# Patient Record
Sex: Female | Born: 1985
Health system: Southern US, Community
[De-identification: ages and names within clinical notes are randomized; demographics above are authoritative.]

## PROBLEM LIST (undated history)

## (undated) DIAGNOSIS — R600 Localized edema: Secondary | ICD-10-CM

## (undated) DIAGNOSIS — K219 Gastro-esophageal reflux disease without esophagitis: Secondary | ICD-10-CM

## (undated) DIAGNOSIS — R079 Chest pain, unspecified: Secondary | ICD-10-CM

## (undated) DIAGNOSIS — H50812 Duane's syndrome, left eye: Secondary | ICD-10-CM

## (undated) DIAGNOSIS — Z862 Personal history of diseases of the blood and blood-forming organs and certain disorders involving the immune mechanism: Secondary | ICD-10-CM

## (undated) DIAGNOSIS — Z87448 Personal history of other diseases of urinary system: Secondary | ICD-10-CM

## (undated) DIAGNOSIS — F319 Bipolar disorder, unspecified: Secondary | ICD-10-CM

## (undated) DIAGNOSIS — F419 Anxiety disorder, unspecified: Secondary | ICD-10-CM

## (undated) DIAGNOSIS — G43909 Migraine, unspecified, not intractable, without status migrainosus: Secondary | ICD-10-CM

## (undated) DIAGNOSIS — B029 Zoster without complications: Secondary | ICD-10-CM

## (undated) DIAGNOSIS — F32A Depression, unspecified: Secondary | ICD-10-CM

## (undated) DIAGNOSIS — D649 Anemia, unspecified: Secondary | ICD-10-CM

## (undated) DIAGNOSIS — L4050 Arthropathic psoriasis, unspecified: Secondary | ICD-10-CM

## (undated) DIAGNOSIS — F988 Other specified behavioral and emotional disorders with onset usually occurring in childhood and adolescence: Secondary | ICD-10-CM

## (undated) DIAGNOSIS — I10 Essential (primary) hypertension: Secondary | ICD-10-CM

## (undated) DIAGNOSIS — R42 Dizziness and giddiness: Secondary | ICD-10-CM

## (undated) DIAGNOSIS — F329 Major depressive disorder, single episode, unspecified: Secondary | ICD-10-CM

## (undated) HISTORY — DX: Essential (primary) hypertension: I10

## (undated) HISTORY — DX: Duane's syndrome, left eye: H50.812

## (undated) HISTORY — DX: Zoster without complications: B02.9

## (undated) HISTORY — DX: Anxiety disorder, unspecified: F41.9

## (undated) HISTORY — DX: Personal history of diseases of the blood and blood-forming organs and certain disorders involving the immune mechanism: Z86.2

## (undated) HISTORY — DX: Major depressive disorder, single episode, unspecified: F32.9

## (undated) HISTORY — DX: Localized edema: R60.0

## (undated) HISTORY — DX: Chest pain, unspecified: R07.9

## (undated) HISTORY — DX: Migraine, unspecified, not intractable, without status migrainosus: G43.909

## (undated) HISTORY — DX: Bipolar disorder, unspecified: F31.9

## (undated) HISTORY — DX: Depression, unspecified: F32.A

## (undated) HISTORY — DX: Personal history of other diseases of urinary system: Z87.448

## (undated) HISTORY — DX: Other specified behavioral and emotional disorders with onset usually occurring in childhood and adolescence: F98.8

## (undated) HISTORY — DX: Dizziness and giddiness: R42

---
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
## 2003-01-09 ENCOUNTER — Inpatient Hospital Stay (HOSPITAL_COMMUNITY): Admission: AD | Admit: 2003-01-09 | Discharge: 2003-01-09 | Payer: Self-pay | Admitting: Obstetrics and Gynecology

## 2003-02-25 ENCOUNTER — Inpatient Hospital Stay (HOSPITAL_COMMUNITY): Admission: AD | Admit: 2003-02-25 | Discharge: 2003-02-25 | Payer: Self-pay | Admitting: Obstetrics and Gynecology

## 2003-03-24 ENCOUNTER — Encounter: Payer: Self-pay | Admitting: Obstetrics and Gynecology

## 2003-03-24 ENCOUNTER — Inpatient Hospital Stay (HOSPITAL_COMMUNITY): Admission: AD | Admit: 2003-03-24 | Discharge: 2003-03-24 | Payer: Self-pay | Admitting: Obstetrics and Gynecology

## 2003-03-31 ENCOUNTER — Inpatient Hospital Stay (HOSPITAL_COMMUNITY): Admission: AD | Admit: 2003-03-31 | Discharge: 2003-04-03 | Payer: Self-pay | Admitting: Obstetrics and Gynecology

## 2003-05-03 ENCOUNTER — Other Ambulatory Visit: Admission: RE | Admit: 2003-05-03 | Discharge: 2003-05-03 | Payer: Self-pay | Admitting: Obstetrics and Gynecology

## 2003-11-13 ENCOUNTER — Emergency Department (HOSPITAL_COMMUNITY): Admission: EM | Admit: 2003-11-13 | Discharge: 2003-11-13 | Payer: Self-pay | Admitting: Family Medicine

## 2004-11-28 ENCOUNTER — Emergency Department (HOSPITAL_COMMUNITY): Admission: EM | Admit: 2004-11-28 | Discharge: 2004-11-28 | Payer: Self-pay | Admitting: Emergency Medicine

## 2005-02-12 ENCOUNTER — Emergency Department (HOSPITAL_COMMUNITY): Admission: EM | Admit: 2005-02-12 | Discharge: 2005-02-12 | Payer: Self-pay | Admitting: Emergency Medicine

## 2006-02-03 ENCOUNTER — Emergency Department (HOSPITAL_COMMUNITY): Admission: EM | Admit: 2006-02-03 | Discharge: 2006-02-03 | Payer: Self-pay | Admitting: Emergency Medicine

## 2006-03-12 ENCOUNTER — Encounter: Admission: RE | Admit: 2006-03-12 | Discharge: 2006-03-12 | Payer: Self-pay | Admitting: Family Medicine

## 2006-04-29 ENCOUNTER — Emergency Department (HOSPITAL_COMMUNITY): Admission: EM | Admit: 2006-04-29 | Discharge: 2006-04-29 | Payer: Self-pay | Admitting: Emergency Medicine

## 2008-09-15 ENCOUNTER — Emergency Department (HOSPITAL_COMMUNITY): Admission: EM | Admit: 2008-09-15 | Discharge: 2008-09-15 | Payer: Self-pay | Admitting: Emergency Medicine

## 2009-12-10 ENCOUNTER — Emergency Department (HOSPITAL_COMMUNITY): Admission: EM | Admit: 2009-12-10 | Discharge: 2009-12-10 | Payer: Self-pay | Admitting: Emergency Medicine

## 2010-06-23 ENCOUNTER — Encounter: Payer: Self-pay | Admitting: Obstetrics

## 2010-08-08 ENCOUNTER — Other Ambulatory Visit (HOSPITAL_COMMUNITY): Payer: Self-pay | Admitting: Obstetrics

## 2010-08-08 DIAGNOSIS — O35EXX0 Maternal care for other (suspected) fetal abnormality and damage, fetal genitourinary anomalies, not applicable or unspecified: Secondary | ICD-10-CM

## 2010-08-08 DIAGNOSIS — O358XX0 Maternal care for other (suspected) fetal abnormality and damage, not applicable or unspecified: Secondary | ICD-10-CM

## 2010-08-08 DIAGNOSIS — O2690 Pregnancy related conditions, unspecified, unspecified trimester: Secondary | ICD-10-CM

## 2010-08-16 ENCOUNTER — Other Ambulatory Visit (HOSPITAL_COMMUNITY): Payer: BC Managed Care – PPO

## 2010-08-22 ENCOUNTER — Ambulatory Visit (HOSPITAL_COMMUNITY)
Admission: RE | Admit: 2010-08-22 | Discharge: 2010-08-22 | Disposition: A | Discharge status: Home / Self Care | Payer: BC Managed Care – PPO | Source: Ambulatory Visit | Attending: Obstetrics | Admitting: Obstetrics

## 2010-08-22 ENCOUNTER — Other Ambulatory Visit (HOSPITAL_COMMUNITY): Payer: Self-pay | Admitting: Obstetrics

## 2010-08-22 ENCOUNTER — Other Ambulatory Visit (HOSPITAL_COMMUNITY): Payer: BC Managed Care – PPO

## 2010-08-22 DIAGNOSIS — O358XX0 Maternal care for other (suspected) fetal abnormality and damage, not applicable or unspecified: Secondary | ICD-10-CM

## 2010-08-22 DIAGNOSIS — Z3689 Encounter for other specified antenatal screening: Secondary | ICD-10-CM | POA: Insufficient documentation

## 2010-08-22 DIAGNOSIS — Z8751 Personal history of pre-term labor: Secondary | ICD-10-CM

## 2010-08-22 DIAGNOSIS — O2690 Pregnancy related conditions, unspecified, unspecified trimester: Secondary | ICD-10-CM

## 2010-08-23 ENCOUNTER — Other Ambulatory Visit (HOSPITAL_COMMUNITY): Payer: BC Managed Care – PPO

## 2010-09-12 ENCOUNTER — Ambulatory Visit (HOSPITAL_COMMUNITY)
Admission: RE | Admit: 2010-09-12 | Discharge: 2010-09-12 | Disposition: A | Discharge status: Home / Self Care | Payer: BC Managed Care – PPO | Source: Ambulatory Visit | Attending: Obstetrics | Admitting: Obstetrics

## 2010-09-12 DIAGNOSIS — Z8751 Personal history of pre-term labor: Secondary | ICD-10-CM

## 2010-09-12 DIAGNOSIS — O358XX0 Maternal care for other (suspected) fetal abnormality and damage, not applicable or unspecified: Secondary | ICD-10-CM | POA: Insufficient documentation

## 2010-09-12 DIAGNOSIS — Z3689 Encounter for other specified antenatal screening: Secondary | ICD-10-CM | POA: Insufficient documentation

## 2010-09-16 ENCOUNTER — Other Ambulatory Visit (HOSPITAL_COMMUNITY): Payer: Self-pay | Admitting: Obstetrics

## 2010-09-16 DIAGNOSIS — O358XX0 Maternal care for other (suspected) fetal abnormality and damage, not applicable or unspecified: Secondary | ICD-10-CM

## 2010-09-28 ENCOUNTER — Observation Stay (HOSPITAL_COMMUNITY)
Admission: AD | Admit: 2010-09-28 | Discharge: 2010-09-29 | DRG: 379 | Disposition: A | Discharge status: Home / Self Care | Payer: BC Managed Care – PPO | Source: Ambulatory Visit | Attending: Obstetrics | Admitting: Obstetrics

## 2010-09-28 DIAGNOSIS — O4700 False labor before 37 completed weeks of gestation, unspecified trimester: Principal | ICD-10-CM | POA: Diagnosis present

## 2010-09-29 LAB — FETAL FIBRONECTIN: Fetal Fibronectin: NEGATIVE

## 2010-09-29 LAB — URINALYSIS, ROUTINE W REFLEX MICROSCOPIC
Bilirubin Urine: NEGATIVE
Leukocytes, UA: NEGATIVE
Nitrite: NEGATIVE
pH: 6.5 (ref 5.0–8.0)

## 2010-09-29 LAB — CBC
Hemoglobin: 11.8 g/dL — ABNORMAL LOW (ref 12.0–15.0)
MCHC: 32.3 g/dL (ref 30.0–36.0)
MCV: 83.9 fL (ref 78.0–100.0)

## 2010-09-29 LAB — URINE MICROSCOPIC-ADD ON

## 2010-10-03 ENCOUNTER — Ambulatory Visit (HOSPITAL_COMMUNITY): Payer: BC Managed Care – PPO

## 2010-10-14 ENCOUNTER — Ambulatory Visit (HOSPITAL_COMMUNITY)
Admission: RE | Admit: 2010-10-14 | Discharge: 2010-10-14 | Disposition: A | Discharge status: Home / Self Care | Payer: BC Managed Care – PPO | Source: Ambulatory Visit | Attending: Obstetrics | Admitting: Obstetrics

## 2010-10-14 DIAGNOSIS — O358XX0 Maternal care for other (suspected) fetal abnormality and damage, not applicable or unspecified: Secondary | ICD-10-CM | POA: Insufficient documentation

## 2010-10-14 DIAGNOSIS — Z3689 Encounter for other specified antenatal screening: Secondary | ICD-10-CM | POA: Insufficient documentation

## 2010-10-24 ENCOUNTER — Inpatient Hospital Stay (HOSPITAL_COMMUNITY)
Admission: AD | Admit: 2010-10-24 | Discharge: 2010-10-25 | Disposition: A | Discharge status: Home / Self Care | Payer: BC Managed Care – PPO | Source: Ambulatory Visit | Attending: Obstetrics & Gynecology | Admitting: Obstetrics & Gynecology

## 2010-10-24 ENCOUNTER — Inpatient Hospital Stay (HOSPITAL_COMMUNITY): Payer: BC Managed Care – PPO

## 2010-10-24 DIAGNOSIS — O4700 False labor before 37 completed weeks of gestation, unspecified trimester: Secondary | ICD-10-CM | POA: Insufficient documentation

## 2010-11-09 ENCOUNTER — Inpatient Hospital Stay (HOSPITAL_COMMUNITY)
Admission: AD | Admit: 2010-11-09 | Discharge: 2010-11-11 | DRG: 372 | Disposition: A | Discharge status: Home / Self Care | Payer: BC Managed Care – PPO | Source: Ambulatory Visit | Attending: Obstetrics | Admitting: Obstetrics

## 2010-11-09 DIAGNOSIS — O139 Gestational [pregnancy-induced] hypertension without significant proteinuria, unspecified trimester: Principal | ICD-10-CM | POA: Diagnosis present

## 2010-11-09 DIAGNOSIS — O409XX0 Polyhydramnios, unspecified trimester, not applicable or unspecified: Secondary | ICD-10-CM | POA: Diagnosis present

## 2010-11-09 LAB — CBC
HCT: 39.9 % (ref 36.0–46.0)
Hemoglobin: 13.4 g/dL (ref 12.0–15.0)
MCH: 27.3 pg (ref 26.0–34.0)
MCV: 81.3 fL (ref 78.0–100.0)
Platelets: 237 10*3/uL (ref 150–400)
WBC: 14.4 10*3/uL — ABNORMAL HIGH (ref 4.0–10.5)

## 2010-11-10 LAB — COMPREHENSIVE METABOLIC PANEL
AST: 19 U/L (ref 0–37)
CO2: 18 mEq/L — ABNORMAL LOW (ref 19–32)
Calcium: 10 mg/dL (ref 8.4–10.5)
Creatinine, Ser: 0.52 mg/dL (ref 0.4–1.2)
GFR calc Af Amer: >60 mL/min (ref >60)
GFR calc non Af Amer: >60 mL/min (ref >60)
Potassium: 3.9 mEq/L (ref 3.5–5.1)
Sodium: 131 mEq/L — ABNORMAL LOW (ref 135–145)

## 2010-11-10 LAB — ABO/RH: ABO/RH(D): O POS

## 2010-11-10 LAB — CREATININE, SERUM: Creatinine, Ser: 0.62 mg/dL (ref 0.4–1.2)

## 2010-11-10 LAB — URIC ACID: Uric Acid, Serum: 4.3 mg/dL (ref 2.4–7.0)

## 2010-11-10 LAB — RPR: RPR Ser Ql: NONREACTIVE

## 2010-11-11 LAB — CBC
MCH: 26.3 pg (ref 26.0–34.0)
MCHC: 32 g/dL (ref 30.0–36.0)
Platelets: 185 10*3/uL (ref 150–400)
RDW: 15.1 % (ref 11.5–15.5)

## 2011-02-25 ENCOUNTER — Ambulatory Visit (HOSPITAL_COMMUNITY)
Admission: RE | Admit: 2011-02-25 | Discharge: 2011-02-25 | Disposition: A | Discharge status: Home / Self Care | Payer: BC Managed Care – PPO | Source: Ambulatory Visit | Attending: Obstetrics & Gynecology | Admitting: Obstetrics & Gynecology

## 2011-02-25 NOTE — Progress Notes (Signed)
Adult Lactation Consultation Outpatient Visit Note  Patient Name: Erin Flores Date of Birth: [birth year] Gestational Age at Delivery: Unknown Type of Delivery: vaginal del of female infant on November 10, 1010, Birth Weight (9-20),  Theone Murdoch weighs 14-9 yesterday.  Breastfeeding History: Frequency of Breastfeeding: every 3-4 hrs Length of Feeding :  Voids:  Stools:   Supplementing / Method: Pumping:  Type of Pump: free style   Frequency:3 times daily while at work.  Volume:  1-3 ounces each pumping   Comments: Mother concerned over low milk supply. She works 5 day s a week and pumps 3 times. 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  Her volume is approx 3 ounces in am and then begins to decrease throught out the day to 1-2 ounces. Her son is taking approx 5-6 ounces at each feeding from care giver every 3-4 hrs. She is concerned because she has had to use formula to supplement.    Consultation Evaluation:  Pumped while  here and she got 45ml. Plan for her to begin pumping 6-8 times daily after each feeding on week end and durning the week try to pump at least 5-6 times. Fenugreek was an option that was discussed along with Blessed Thistle and Goats rue. Encouraged mother to have lots of skin to skin time with Eli . Power pumping was also suggested. Encouraged mother to cue base feed Eli on weekends .Scheduled to fup in 2 weeks at mother request..    Initial Feeding Assessment: Pre-feed Weight: Post-feed Weight: Amount Transferred: Comments:  Additional Feeding Assessment: Pre-feed Weight: Post-feed Weight: Amount Transferred: Comments:  Additional Feeding Assessment: Pre-feed Weight: Post-feed Weight: Amount Transferred: Comments:  Total Breast milk Transferred this Visit:  Total Supplement Given:   Additional Interventions:   Follow-Up  fup to see lactation consultant on October 16 at 4 pm.    Stevan Born [facility] 02/25/2011, 3:53  PM

## 2011-03-18 ENCOUNTER — Inpatient Hospital Stay (HOSPITAL_COMMUNITY): Admission: RE | Admit: 2011-03-18 | Payer: BC Managed Care – PPO | Source: Ambulatory Visit

## 2011-06-15 ENCOUNTER — Emergency Department (HOSPITAL_COMMUNITY): Payer: BC Managed Care – PPO

## 2011-06-15 ENCOUNTER — Encounter (HOSPITAL_COMMUNITY): Payer: Self-pay

## 2011-06-15 ENCOUNTER — Emergency Department (HOSPITAL_COMMUNITY)
Admission: EM | Admit: 2011-06-15 | Discharge: 2011-06-16 | Disposition: A | Discharge status: Home / Self Care | Payer: BC Managed Care – PPO | Attending: Emergency Medicine | Admitting: Emergency Medicine

## 2011-06-15 DIAGNOSIS — X500XXA Overexertion from strenuous movement or load, initial encounter: Secondary | ICD-10-CM | POA: Insufficient documentation

## 2011-06-15 DIAGNOSIS — S93409A Sprain of unspecified ligament of unspecified ankle, initial encounter: Secondary | ICD-10-CM | POA: Insufficient documentation

## 2011-06-15 DIAGNOSIS — M79609 Pain in unspecified limb: Secondary | ICD-10-CM | POA: Insufficient documentation

## 2011-06-15 DIAGNOSIS — S93401A Sprain of unspecified ligament of right ankle, initial encounter: Secondary | ICD-10-CM

## 2011-06-15 NOTE — ED Notes (Signed)
Rt ankle and foot pain from injury 1 month ago but pt did not seek treatment

## 2011-06-16 NOTE — ED Notes (Signed)
Pt c/o R ankle pain. Pt states originally injured ankle playing football on 12/25. Pt states stepped wrong last night, now hurting worse. Able to move although painful. Pt tearful. Slight swelling noted. Family at bedside. A.Dennis,RN

## 2011-06-16 NOTE — ED Provider Notes (Signed)
Medical screening examination/treatment/procedure(s) were performed by non-physician practitioner and as supervising physician I was immediately available for consultation/collaboration.   Vida Roller, MD 06/16/11 [phone]

## 2011-06-16 NOTE — ED Provider Notes (Signed)
History     CSN: [id number]  Arrival date & time 06/15/11  2057   First MD Initiated Contact with Patient 06/16/11 [phone]      Chief Complaint  Patient presents with  . Foot Pain    (Consider location/radiation/quality/duration/timing/severity/associated sxs/prior treatment) Patient is a 26 y.o. female presenting with ankle pain. The history is provided by the patient.  Ankle Pain  Pertinent negatives include no numbness.  Pt reports a hx of injury to the right ankle on 05/27/2011 where she stepped in a hole while playing football. Pain the next day that gradually improved over the next couple of weeks though never completely resolved, with worst pain noted on plantar and dorsi flexion. While walking down stairs today as she was leaving the gym, "stepped wrong" on last stair and had inversion injury of same ankle. Currently unable to bear weight. Pain throbbing, constant, moderate in intensity. No radiation from ankle and hindfoot. Denies numbness, tingling, or weakness distal to injury site but does reports weakness to the ankle itself.  History reviewed. No pertinent past medical history.  History reviewed. No pertinent past surgical history.  No family history on file.  History  Substance Use Topics  . Smoking status: Never Smoker   . Smokeless tobacco: Not on file  . Alcohol Use: Yes     Review of Systems  Constitutional: Negative for fever and chills.  Musculoskeletal: Positive for joint swelling and gait problem.  Skin: Negative for color change and wound.  Neurological: Negative for weakness and numbness.    Allergies  Review of patient's allergies indicates no known allergies.  Home Medications  No current outpatient prescriptions on file.  BP 119/72  Pulse 84  Temp(Src) 98.3 F (36.8 C) (Oral)  Resp 17  Ht 5\' 3"  (1.6 m)  Wt 179 lb (81.194 kg)  BMI 31.71 kg/m2  SpO2 99%  LMP 05/15/2011  Physical Exam  Nursing note and vitals reviewed. Constitutional:  She is oriented to person, place, and time. She appears well-developed and well-nourished. No distress.  HENT:  Head: Normocephalic and atraumatic.  Right Ear: External ear normal.  Left Ear: External ear normal.  Eyes: Pupils are equal, round, and reactive to light.  Neck: Normal range of motion. Neck supple.  Cardiovascular: Normal rate and regular rhythm.   Pulmonary/Chest: Effort normal. No respiratory distress.  Abdominal: Soft. She exhibits no distension. There is no tenderness.  Musculoskeletal:       Right ankle: She exhibits decreased range of motion, swelling and ecchymosis. She exhibits no deformity, no laceration and normal pulse. tenderness. Lateral malleolus tenderness found. No medial malleolus and no proximal fibula tenderness found.       Feet:       All other joints without tenderness, edema, or decreased ROM.  Neurological: She is alert and oriented to person, place, and time.       Sensation intact to light touch. Good 2-point discrimination.  Skin: Skin is warm and dry.       Slight ecchymosis over lateral malleolus.    ED Course  Procedures (including critical care time)  Labs Reviewed - No data to display Dg Ankle Complete Right  06/16/2011  *RADIOLOGY REPORT*  Clinical Data: Status post fall, with twisting injury to right ankle and foot.  Lateral right foot and ankle pain.  RIGHT ANKLE - COMPLETE 3+ VIEW  Comparison: None.  Findings: There is no evidence of fracture or dislocation.  The ankle mortise is intact; the interosseous space is  within normal limits.  No talar tilt or subluxation is seen.  A small plantar calcaneal spur is noted.  The joint spaces are preserved.  Soft tissue swelling is noted overlying the lateral malleolus.  IMPRESSION: No evidence of fracture or dislocation.  Original Report Authenticated By: Tonia Ghent, M.D.   Dg Foot Complete Right  06/16/2011  *RADIOLOGY REPORT*  Clinical Data: Status post fall, with twisting injury to right ankle  and foot.  Lateral fifth metatarsal pain.  RIGHT FOOT COMPLETE - 3+ VIEW  Comparison: None.  Findings: There is mild apparent cortical irregularity involving the distal aspect of the fifth proximal phalanx.  This may remain within normal limits; suggest clinical correlation for associated symptoms.  There is no additional evidence to suggest fracture.  The joint spaces are grossly preserved.  There is no evidence of talar subluxation; the subtalar joint is unremarkable in appearance.  A small plantar calcaneal spur is incidentally seen.  No significant soft tissue abnormalities are seen.  IMPRESSION: Mild cortical irregularity involving the distal aspect of the fifth proximal phalanx; this may remain within normal limits.  Suggest clinical correlation for associated symptoms.  No additional evidence for fracture.  Original Report Authenticated By: Tonia Ghent, M.D.     Dx 1: Ankle sprain, right   MDM  X-ray reviewed. Irregularity on x-ray does not correlate with examination to suggest acute fx. Discussed ankle sprain tx with pt, who voices understanding. She will be non-weight bearing for now and will f/u with orthopedics if no improvement in pain after 1 week (though I advised that some pain may persist for up to 6 weeks)        Shaaron Adler, PA 06/16/11 0121   Pt declines pain medication.  [address] [place], [place] 06/16/11 [phone]

## 2013-01-04 ENCOUNTER — Emergency Department (HOSPITAL_COMMUNITY)
Admission: EM | Admit: 2013-01-04 | Discharge: 2013-01-04 | Disposition: A | Discharge status: Home / Self Care | Payer: PRIVATE HEALTH INSURANCE | Source: Home / Self Care | Attending: Emergency Medicine | Admitting: Emergency Medicine

## 2013-01-04 ENCOUNTER — Encounter (HOSPITAL_COMMUNITY): Payer: Self-pay | Admitting: Emergency Medicine

## 2013-01-04 DIAGNOSIS — R0789 Other chest pain: Secondary | ICD-10-CM

## 2013-01-04 DIAGNOSIS — J029 Acute pharyngitis, unspecified: Secondary | ICD-10-CM

## 2013-01-04 DIAGNOSIS — R05 Cough: Secondary | ICD-10-CM

## 2013-01-04 DIAGNOSIS — R109 Unspecified abdominal pain: Secondary | ICD-10-CM

## 2013-01-04 DIAGNOSIS — R071 Chest pain on breathing: Secondary | ICD-10-CM

## 2013-01-04 HISTORY — DX: Arthropathic psoriasis, unspecified: L40.50

## 2013-01-04 LAB — POCT URINALYSIS DIP (DEVICE)
Bilirubin Urine: NEGATIVE
Glucose, UA: NEGATIVE mg/dL
Ketones, ur: NEGATIVE mg/dL
Leukocytes, UA: NEGATIVE
Nitrite: NEGATIVE
Protein, ur: NEGATIVE mg/dL
Specific Gravity, Urine: 1.015 (ref 1.005–1.030)
Urobilinogen, UA: 0.2 mg/dL (ref 0.0–1.0)
pH: 6.5 (ref 5.0–8.0)

## 2013-01-04 LAB — POCT RAPID STREP A: Streptococcus, Group A Screen (Direct): NEGATIVE

## 2013-01-04 MED ORDER — GUAIFENESIN-CODEINE 100-10 MG/5ML PO SYRP: 5.0000 mL | ORAL_SOLUTION | Freq: Three times a day (TID) | ORAL | Status: DC | PRN

## 2013-01-04 NOTE — ED Notes (Signed)
Patient report nausea and diarrhea on Friday and Saturday , that resolved.  Saturday patient reports cough and rib cage soreness around torso with cough or deep inspiration.  Yesterday sore throat started.

## 2013-01-04 NOTE — ED Notes (Signed)
Patient instructed to put on gown 

## 2013-01-04 NOTE — ED Provider Notes (Signed)
CSN: [id number]     Arrival date & time 01/04/13  0802 History     First MD Initiated Contact with Patient 01/04/13 [phone]     Chief Complaint  Patient presents with  . URI   (Consider location/radiation/quality/duration/timing/severity/associated sxs/prior Treatment) HPI Comments: Issue presents urgent care the describes she needed to be checked as she is experiencing cough and soreness in her chest when she takes a deep breath or has a coughing episode. He describes that yesterday she started also with a sore throat when she swallows. Reports no fevers . Friday had an episode on which she was feeling nauseous and had diarrhea. That has since then resolved.  Patient denies any abdominal pain at rest, nausea vomiting at this point. Denies any urinary symptoms such as increased frequency pain pressure burning with urination. Denies any vaginal discharge or bleeding. Patient denies any generalized malaise at this point such as headache problems or myalgias. She is somewhat concerned as she takes Humira for her rheumatoid arthritis and she's immunosuppressed and is wondering if she shouldn't be working tomorrow and to prevent exposing other people to a potential infection.      Patient is a 27 y.o. female presenting with URI. The history is provided by the patient.  URI Presenting symptoms: congestion, cough and sore throat   Presenting symptoms: no ear pain, no fatigue, no fever and no rhinorrhea   Severity:  Moderate Onset quality:  Sudden Duration:  1 day Timing:  Constant Chronicity:  New Worsened by:  Nothing tried Associated symptoms: sinus pain   Associated symptoms: no arthralgias, no headaches, no neck pain, no sneezing and no wheezing   Risk factors: no chronic cardiac disease, no chronic kidney disease, no diabetes mellitus, no recent illness, no recent travel and no sick contacts     Past Medical History  Diagnosis Date  . Psoriatic arthritis    History reviewed. No  pertinent past surgical history. History reviewed. No pertinent family history. History  Substance Use Topics  . Smoking status: Never Smoker   . Smokeless tobacco: Not on file  . Alcohol Use: Yes   OB History   Grav Para Term Preterm Abortions TAB SAB Ect Mult Living                 Review of Systems  Constitutional: Negative for fever, diaphoresis, appetite change and fatigue.  HENT: Positive for congestion and sore throat. Negative for ear pain, nosebleeds, facial swelling, rhinorrhea, sneezing, neck pain and neck stiffness.   Respiratory: Positive for cough. Negative for wheezing.   Cardiovascular: Negative for chest pain and leg swelling.  Musculoskeletal: Negative for back pain, joint swelling and arthralgias.  Skin: Negative for color change, pallor, rash and wound.  Neurological: Negative for headaches.    Allergies  Review of patient's allergies indicates no known allergies.  Home Medications   Current Outpatient Rx  Name  Route  Sig  Dispense  Refill  . Adalimumab (HUMIRA PEN [place])   Subcutaneous   Inject into the skin.         Marland Kitchen ibuprofen (ADVIL,MOTRIN) 200 MG tablet   Oral   Take 200 mg by mouth every 6 (six) hours as needed for pain.         Marland Kitchen guaiFENesin-codeine (ROBITUSSIN AC) 100-10 MG/5ML syrup   Oral   Take 5 mLs by mouth 3 (three) times daily as needed for cough.   120 mL   0    BP 118/64  Pulse 94  Temp(Src) 98 F (36.7 C) (Oral)  Resp 18  SpO2 98% Physical Exam  Nursing note and vitals reviewed. Constitutional: Vital signs are normal. She appears well-developed and well-nourished.  Non-toxic appearance. She does not have a sickly appearance. She does not appear ill. No distress.  HENT:  Head: Normocephalic.  Right Ear: Tympanic membrane normal.  Left Ear: Tympanic membrane normal.  Mouth/Throat: Uvula is midline. Posterior oropharyngeal erythema present. No oropharyngeal exudate or posterior oropharyngeal edema.  Eyes: Conjunctivae are  normal. No scleral icterus.  Neck: Neck supple. No JVD present.  Cardiovascular: Normal rate.  Exam reveals no gallop and no friction rub.   No murmur heard. Abdominal: Soft. She exhibits no mass. There is no hepatosplenomegaly. There is tenderness. There is no rigidity, no rebound, no guarding, no CVA tenderness, no tenderness at McBurney's point and negative Murphy's sign.    Neurological: She is alert.  Skin: No rash noted. No erythema.    ED Course   Procedures (including critical care time)  Labs Reviewed  POCT URINALYSIS DIP (DEVICE) - Abnormal; Notable for the following:    Hgb urine dipstick TRACE (*)    All other components within normal limits  URINE CULTURE  CULTURE, GROUP A STREP  POCT RAPID STREP A ([facility] CARE ONLY)   No results found. 1. Cough   2. Chest wall pain   3. Pharyngitis   4. Abdominal pain     MDM  Problem #1 Pharyngitis, negative strep test, patient afebrile no cervical lymphadenopathies. Most likely viral pharyngitis. I recommended patient to treat this symptomatically with Tylenol and ibuprofen as well was prescribed a antitussive syrup. For a reactive cough and muscle skeletal chest wall discomfort and pain.  Problem #2 incidental or/chronic abdominal pain Patient reported that, she is to see her gynecologist in a week or so for this ongoing discomfort. Patient is afebrile with a soft abdomen unlikely to be experiencing an acute abdomen.  Problem #3 Trace hematuria without any urinary symptoms, I have ordered a urine culture. Advised patient that she will be contacted if abnormal test results.  Plan of care: Symptomatic management with antitussive, and cough suppressant. Symptomatic management with Tylenol or ibuprofen for sore throat discomfort. Instructed patient that if worsening symptoms such as increased abdominal pain, any shortness of breath should return for further evaluation. Patient agreed with treatment plan followup care.  A  doctor's note was provided not to work tomorrow.  Jimmie Molly, MD 01/04/13 1425

## 2013-01-05 LAB — URINE CULTURE: Colony Count: 8000

## 2013-01-06 LAB — CULTURE, GROUP A STREP

## 2013-08-03 ENCOUNTER — Other Ambulatory Visit: Payer: Self-pay | Admitting: Gastroenterology

## 2013-08-03 DIAGNOSIS — R1011 Right upper quadrant pain: Secondary | ICD-10-CM

## 2013-08-03 DIAGNOSIS — R112 Nausea with vomiting, unspecified: Secondary | ICD-10-CM

## 2013-08-05 ENCOUNTER — Ambulatory Visit (HOSPITAL_COMMUNITY)
Admission: RE | Admit: 2013-08-05 | Discharge: 2013-08-05 | Disposition: A | Discharge status: Home / Self Care | Payer: PRIVATE HEALTH INSURANCE | Source: Ambulatory Visit | Attending: Gastroenterology | Admitting: Gastroenterology

## 2013-08-05 DIAGNOSIS — R1011 Right upper quadrant pain: Secondary | ICD-10-CM

## 2013-08-05 DIAGNOSIS — R112 Nausea with vomiting, unspecified: Secondary | ICD-10-CM | POA: Insufficient documentation

## 2013-08-05 MED ORDER — SINCALIDE 5 MCG IJ SOLR
0.0200 ug/kg | Freq: Once | INTRAMUSCULAR | Status: AC
  Administered 2013-08-05: 5 ug via INTRAVENOUS

## 2013-08-05 MED ORDER — STERILE WATER FOR INJECTION IJ SOLN
INTRAMUSCULAR | Status: AC
  Filled 2013-08-05: qty 10

## 2013-08-05 MED ORDER — TECHNETIUM TC 99M MEBROFENIN IV KIT
5.0000 | PACK | Freq: Once | INTRAVENOUS | Status: AC | PRN
  Administered 2013-08-05: 5 via INTRAVENOUS

## 2013-08-05 MED ORDER — SINCALIDE 5 MCG IJ SOLR
INTRAMUSCULAR | Status: AC
  Filled 2013-08-05: qty 5

## 2013-08-12 ENCOUNTER — Ambulatory Visit (HOSPITAL_COMMUNITY): Payer: PRIVATE HEALTH INSURANCE

## 2013-08-16 ENCOUNTER — Ambulatory Visit (INDEPENDENT_AMBULATORY_CARE_PROVIDER_SITE_OTHER): Payer: PRIVATE HEALTH INSURANCE | Admitting: Surgery

## 2013-08-16 ENCOUNTER — Encounter (INDEPENDENT_AMBULATORY_CARE_PROVIDER_SITE_OTHER): Payer: Self-pay | Admitting: Surgery

## 2013-08-16 VITALS — BP 108/72 | HR 76 | Temp 98.8°F | Resp 14 | Ht 63.25 in | Wt 177.0 lb

## 2013-08-16 DIAGNOSIS — K828 Other specified diseases of gallbladder: Secondary | ICD-10-CM | POA: Insufficient documentation

## 2013-08-16 DIAGNOSIS — R109 Unspecified abdominal pain: Secondary | ICD-10-CM | POA: Insufficient documentation

## 2013-08-16 NOTE — Patient Instructions (Signed)
  [facility]  LAPAROSCOPIC SURGERY - POST-OP INSTRUCTIONS  Always review your discharge instruction sheet given to you by the facility where your surgery was performed.  A prescription for pain medication may be given to you upon discharge.  Take your pain medication as prescribed.  If narcotic pain medicine is not needed, then you may take acetaminophen (Tylenol) or ibuprofen (Advil) as needed.  Take your usually prescribed medications unless otherwise directed.  If you need a refill on your pain medication, please contact your pharmacy.  They will contact our office to request authorization. Prescriptions will not be filled after 5 P.M. or on weekends.  You should follow a light diet the first few days after arrival home, such as soup and crackers or toast.  Be sure to include plenty of fluids daily.  Most patients will experience some swelling and bruising in the area of the incisions.  Ice packs will help.  Swelling and bruising can take several days to resolve.   It is common to experience some constipation if taking pain medication after surgery.  Increasing fluid intake and taking a stool softener (such as Colace) will usually help or prevent this problem from occurring.  A mild laxative (Milk of Magnesia or Miralax) should be taken according to package instructions if there are no bowel movements after 48 hours.  Unless discharge instructions indicate otherwise, you may remove your bandages 24-48 hours after surgery, and you may shower at that time.  You may have steri-strips (small skin tapes) in place directly over the incision.  These strips should be left on the skin for 7-10 days.  If your surgeon used skin glue on the incision, you may shower in 24 hours.  The glue will flake off over the next 2-3 weeks.  Any sutures or staples will be removed at the office during your follow-up visit.  ACTIVITIES:  You may resume regular (light) daily activities beginning the  next day-such as daily self-care, walking, climbing stairs-gradually increasing activities as tolerated.  You may have sexual intercourse when it is comfortable.  Refrain from any heavy lifting or straining until approved by your doctor.  You may drive when you are no longer taking prescription pain medication, you can comfortably wear a seatbelt, and you can safely maneuver your car and apply brakes.  You should see your doctor in the office for a follow-up appointment approximately 2-3 weeks after your surgery.  Make sure that you call for this appointment within a day or two after you arrive home to insure a convenient appointment time.  WHEN TO CALL YOUR DOCTOR: 1. Fever over 101.0 2. Inability to urinate 3. Continued bleeding from incision 4. Increased pain, redness, or drainage from the incision 5. Increasing abdominal pain  The clinic staff is available to answer your questions during regular business hours.  Please don't hesitate to call and ask to speak to one of the nurses for clinical concerns.  If you have a medical emergency, go to the nearest emergency room or call 911.  A surgeon from [facility] is always on call for the hospital.  Arra Connaughton M. Shaneisha Burkel, MD, FACS [facility] Office: [phone] Toll Free:  [phone] FAX [phone]  Web site: [email or web address] 

## 2013-08-16 NOTE — Progress Notes (Signed)
General Surgery [facility]  Chief Complaint  Patient presents with  . New Evaluation    eval biliary dyskinesia - referral from Dr. 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 Patient has been symptomatic for approximately 2 months. Pain waxes and wanes. Patient was seen by gastroenterology and underwent abdominal ultrasound which was normal. Nuclear medicine hepatobiliary scan was performed and had a normal ejection fraction of 74% but the patient experienced pain and nausea consistent with her symptoms at the time of administration of CCK. Patient is referred for consideration of cholecystectomy.  Patient has no prior history of hepatobiliary disease. She has no history of jaundice or acholic stools. She has had irregularity with diarrhea and constipation. She has had no prior abdominal surgery other than removal of an intrauterine device yesterday.  There is a family history of biliary disease in the patient's sister.  Past Medical History  Diagnosis Date  . Psoriatic arthritis     Current Outpatient Prescriptions  Medication Sig Dispense Refill  . Adalimumab (HUMIRA PEN [place]) Inject into the skin.      Marland Kitchen ibuprofen (ADVIL,MOTRIN) 200 MG tablet Take 200 mg by mouth every 6 (six) hours as needed for pain.       No current facility-administered medications for this visit.    No Known Allergies  Family History  Problem Relation Age of Onset  . Cancer Maternal Aunt     breast    History   Social History  . Marital Status: Married    Spouse Name: N/A    Number of Children: N/A  . Years of Education: N/A   Social History Main Topics  . Smoking status: Former Smoker    Quit date: 06/02/2006  . Smokeless tobacco: None  . Alcohol Use: Yes     Comment: occ  . Drug Use: No  . Sexual Activity: None    Other Topics Concern  . None   Social History Narrative  . None    REVIEW OF SYSTEMS - PERTINENT POSITIVES ONLY: Denies jaundice. Denies acholic stools. Denies fevers or chills. Nausea but no emesis. Right upper quadrant pain, relatively constant with radiation to the back and right chest.  EXAM: Filed Vitals:   08/16/13 1325  BP: 108/72  Pulse: 76  Temp: 98.8 F (37.1 C)  Resp: 14    GENERAL: well-developed, well-nourished, no acute distress HEENT: normocephalic; pupils equal and reactive; sclerae clear; dentition good; mucous membranes moist NECK:  No palpable masses in the thyroid bed; symmetric on extension; no palpable anterior or posterior cervical lymphadenopathy; no supraclavicular masses; no tenderness CHEST: clear to auscultation bilaterally without rales, rhonchi, or wheezes CARDIAC: regular rate and rhythm without significant murmur; peripheral pulses are full ABDOMEN: soft without distension; bowel sounds present; no mass; no hepatosplenomegaly; no hernia; mild tenderness to palpation at right costal margin EXT:  non-tender without edema; no deformity NEURO: no gross focal deficits; no sign of tremor   LABORATORY RESULTS: See [facility] ([facility]Epic) for most recent results  RADIOLOGY RESULTS: See [facility] (CHL-Epic) for most recent results  IMPRESSION: #1 right upper quadrant abdominal pain with radiation to back and associated nausea, suspected biliary dyskinesia  PLAN: The patient and I discussed these findings at length. We reviewed her ultrasound results and her hepatobiliary scan results. We discussed her symptoms. Patient is interested in laparoscopic  cholecystectomy for management of right upper quadrant abdominal pain and nausea. I explained to her that most people have cholelithiasis. It does not appear from her studies that she has cholelithiasis. Patient has approximately a 60% chance of symptomatic improvement with cholecystectomy. Risk  and benefits of the procedure discussed including the possibility of conversion to open surgery. We will plan on intraoperative cholangiography. Patient would like to proceed with surgery next week.  The risks and benefits of the procedure have been discussed at length with the patient.  The patient understands the proposed procedure, potential alternative treatments, and the course of recovery to be expected.  All of the patient's questions have been answered at this time.  The patient wishes to proceed with surgery.  Velora Hecklerodd M. Chiyoko Torrico, MD, FACS General & Endocrine Surgery [facility]  Primary Care Physician: Theda BelfastHUNG,PATRICK D, MD

## 2013-08-17 ENCOUNTER — Encounter (HOSPITAL_COMMUNITY): Payer: Self-pay | Admitting: *Deleted

## 2013-08-17 NOTE — Progress Notes (Signed)
Patient reported at time of preop phone call had IUD removed on 08/15/13.

## 2013-08-19 ENCOUNTER — Observation Stay (HOSPITAL_COMMUNITY)
Admission: RE | Admit: 2013-08-19 | Discharge: 2013-08-20 | Disposition: A | Discharge status: Home / Self Care | Payer: PRIVATE HEALTH INSURANCE | Source: Ambulatory Visit | Attending: Surgery | Admitting: Surgery

## 2013-08-19 ENCOUNTER — Encounter (HOSPITAL_COMMUNITY): Payer: PRIVATE HEALTH INSURANCE | Admitting: Anesthesiology

## 2013-08-19 ENCOUNTER — Encounter (HOSPITAL_COMMUNITY): Payer: Self-pay | Admitting: *Deleted

## 2013-08-19 ENCOUNTER — Ambulatory Visit (HOSPITAL_COMMUNITY): Payer: PRIVATE HEALTH INSURANCE | Admitting: Anesthesiology

## 2013-08-19 ENCOUNTER — Ambulatory Visit (HOSPITAL_COMMUNITY): Payer: PRIVATE HEALTH INSURANCE

## 2013-08-19 ENCOUNTER — Encounter (HOSPITAL_COMMUNITY)
Admission: RE | Disposition: A | Discharge status: Home / Self Care | Payer: Self-pay | Source: Ambulatory Visit | Attending: Surgery

## 2013-08-19 DIAGNOSIS — K828 Other specified diseases of gallbladder: Secondary | ICD-10-CM | POA: Diagnosis present

## 2013-08-19 DIAGNOSIS — L4050 Arthropathic psoriasis, unspecified: Secondary | ICD-10-CM | POA: Insufficient documentation

## 2013-08-19 DIAGNOSIS — K219 Gastro-esophageal reflux disease without esophagitis: Secondary | ICD-10-CM | POA: Insufficient documentation

## 2013-08-19 DIAGNOSIS — R109 Unspecified abdominal pain: Secondary | ICD-10-CM | POA: Diagnosis present

## 2013-08-19 DIAGNOSIS — Z79899 Other long term (current) drug therapy: Secondary | ICD-10-CM | POA: Insufficient documentation

## 2013-08-19 DIAGNOSIS — K811 Chronic cholecystitis: Principal | ICD-10-CM | POA: Insufficient documentation

## 2013-08-19 DIAGNOSIS — K824 Cholesterolosis of gallbladder: Secondary | ICD-10-CM

## 2013-08-19 DIAGNOSIS — Z87891 Personal history of nicotine dependence: Secondary | ICD-10-CM | POA: Insufficient documentation

## 2013-08-19 HISTORY — DX: Anemia, unspecified: D64.9

## 2013-08-19 HISTORY — PX: CHOLECYSTECTOMY: SHX55

## 2013-08-19 HISTORY — DX: Gastro-esophageal reflux disease without esophagitis: K21.9

## 2013-08-19 LAB — CBC
HCT: 42.3 % (ref 36.0–46.0)
Hemoglobin: 14 g/dL (ref 12.0–15.0)
MCH: 26.9 pg (ref 26.0–34.0)
MCHC: 33.1 g/dL (ref 30.0–36.0)
MCV: 81.3 fL (ref 78.0–100.0)
PLATELETS: 233 10*3/uL (ref 150–400)
RBC: 5.2 MIL/uL — ABNORMAL HIGH (ref 3.87–5.11)
RDW: 12.9 % (ref 11.5–15.5)
WBC: 6.6 10*3/uL (ref 4.0–10.5)

## 2013-08-19 LAB — HCG, SERUM, QUALITATIVE: PREG SERUM: NEGATIVE

## 2013-08-19 SURGERY — LAPAROSCOPIC CHOLECYSTECTOMY WITH INTRAOPERATIVE CHOLANGIOGRAM
Anesthesia: General | Site: Abdomen

## 2013-08-19 MED ORDER — FENTANYL CITRATE 0.05 MG/ML IJ SOLN
INTRAMUSCULAR | Status: DC | PRN
  Administered 2013-08-19: 100 ug via INTRAVENOUS
  Administered 2013-08-19 (×3): 50 ug via INTRAVENOUS

## 2013-08-19 MED ORDER — MIDAZOLAM HCL 5 MG/5ML IJ SOLN
INTRAMUSCULAR | Status: DC | PRN
  Administered 2013-08-19: 2 mg via INTRAVENOUS

## 2013-08-19 MED ORDER — NEOSTIGMINE METHYLSULFATE 1 MG/ML IJ SOLN
INTRAMUSCULAR | Status: AC
  Filled 2013-08-19: qty 10

## 2013-08-19 MED ORDER — SCOPOLAMINE 1 MG/3DAYS TD PT72
1.0000 | MEDICATED_PATCH | TRANSDERMAL | Status: DC
  Administered 2013-08-19: 1.5 mg via TRANSDERMAL
  Filled 2013-08-19: qty 1

## 2013-08-19 MED ORDER — ALPRAZOLAM 0.25 MG PO TABS
0.2500 mg | ORAL_TABLET | Freq: Four times a day (QID) | ORAL | Status: DC | PRN
  Administered 2013-08-19: 0.25 mg via ORAL
  Filled 2013-08-19: qty 1

## 2013-08-19 MED ORDER — ONDANSETRON HCL 4 MG/2ML IJ SOLN
INTRAMUSCULAR | Status: AC
  Filled 2013-08-19: qty 2

## 2013-08-19 MED ORDER — SCOPOLAMINE 1 MG/3DAYS TD PT72
MEDICATED_PATCH | TRANSDERMAL | Status: AC
  Filled 2013-08-19: qty 1

## 2013-08-19 MED ORDER — HYDROMORPHONE HCL PF 1 MG/ML IJ SOLN
INTRAMUSCULAR | Status: AC
  Filled 2013-08-19: qty 1

## 2013-08-19 MED ORDER — IOHEXOL 300 MG/ML  SOLN
INTRAMUSCULAR | Status: DC | PRN
  Administered 2013-08-19: 2.5 mL via INTRAVENOUS

## 2013-08-19 MED ORDER — CEFAZOLIN SODIUM-DEXTROSE 2-3 GM-% IV SOLR
INTRAVENOUS | Status: AC
  Filled 2013-08-19: qty 50

## 2013-08-19 MED ORDER — CEFAZOLIN SODIUM-DEXTROSE 2-3 GM-% IV SOLR
2.0000 g | INTRAVENOUS | Status: AC
  Administered 2013-08-19: 2 g via INTRAVENOUS

## 2013-08-19 MED ORDER — ROCURONIUM BROMIDE 100 MG/10ML IV SOLN
INTRAVENOUS | Status: DC | PRN
  Administered 2013-08-19: 45 mg via INTRAVENOUS

## 2013-08-19 MED ORDER — HYDROMORPHONE HCL PF 1 MG/ML IJ SOLN
0.2500 mg | INTRAMUSCULAR | Status: DC | PRN
  Administered 2013-08-19 (×4): 0.5 mg via INTRAVENOUS

## 2013-08-19 MED ORDER — 0.9 % SODIUM CHLORIDE (POUR BTL) OPTIME
TOPICAL | Status: DC | PRN
  Administered 2013-08-19: 1000 mL

## 2013-08-19 MED ORDER — HYDROMORPHONE HCL PF 1 MG/ML IJ SOLN
1.0000 mg | INTRAMUSCULAR | Status: DC | PRN
  Administered 2013-08-19: 1 mg via INTRAVENOUS
  Filled 2013-08-19: qty 1

## 2013-08-19 MED ORDER — ONDANSETRON HCL 4 MG PO TABS: 4.0000 mg | ORAL_TABLET | Freq: Four times a day (QID) | ORAL | Status: DC | PRN

## 2013-08-19 MED ORDER — HYDROCODONE-ACETAMINOPHEN 5-325 MG PO TABS: 1.0000 | ORAL_TABLET | ORAL | Status: DC | PRN

## 2013-08-19 MED ORDER — LIDOCAINE HCL (CARDIAC) 20 MG/ML IV SOLN
INTRAVENOUS | Status: AC
  Filled 2013-08-19: qty 5

## 2013-08-19 MED ORDER — LIDOCAINE HCL (CARDIAC) 20 MG/ML IV SOLN
INTRAVENOUS | Status: DC | PRN
  Administered 2013-08-19: 50 mg via INTRAVENOUS

## 2013-08-19 MED ORDER — PROMETHAZINE HCL 25 MG/ML IJ SOLN
6.2500 mg | INTRAMUSCULAR | Status: DC | PRN
  Administered 2013-08-19: 12.5 mg via INTRAVENOUS

## 2013-08-19 MED ORDER — KCL IN DEXTROSE-NACL 20-5-0.45 MEQ/L-%-% IV SOLN
INTRAVENOUS | Status: DC
  Administered 2013-08-19: 14:00:00 via INTRAVENOUS
  Filled 2013-08-19 (×2): qty 1000

## 2013-08-19 MED ORDER — PROMETHAZINE HCL 25 MG/ML IJ SOLN
INTRAMUSCULAR | Status: AC
  Filled 2013-08-19: qty 1

## 2013-08-19 MED ORDER — DEXAMETHASONE SODIUM PHOSPHATE 10 MG/ML IJ SOLN
INTRAMUSCULAR | Status: DC | PRN
  Administered 2013-08-19: 10 mg via INTRAVENOUS

## 2013-08-19 MED ORDER — ONDANSETRON HCL 4 MG/2ML IJ SOLN: 4.0000 mg | Freq: Four times a day (QID) | INTRAMUSCULAR | Status: DC | PRN

## 2013-08-19 MED ORDER — FENTANYL CITRATE 0.05 MG/ML IJ SOLN
INTRAMUSCULAR | Status: AC
  Filled 2013-08-19: qty 5

## 2013-08-19 MED ORDER — ONDANSETRON HCL 4 MG/2ML IJ SOLN
INTRAMUSCULAR | Status: DC | PRN
  Administered 2013-08-19: 4 mg via INTRAVENOUS

## 2013-08-19 MED ORDER — PROPOFOL 10 MG/ML IV BOLUS
INTRAVENOUS | Status: DC | PRN
  Administered 2013-08-19: 150 mg via INTRAVENOUS

## 2013-08-19 MED ORDER — LACTATED RINGERS IV SOLN
INTRAVENOUS | Status: DC
  Administered 2013-08-19: 1000 mL via INTRAVENOUS

## 2013-08-19 MED ORDER — MIDAZOLAM HCL 2 MG/2ML IJ SOLN
INTRAMUSCULAR | Status: AC
  Filled 2013-08-19: qty 2

## 2013-08-19 MED ORDER — GLYCOPYRROLATE 0.2 MG/ML IJ SOLN
INTRAMUSCULAR | Status: AC
  Filled 2013-08-19: qty 3

## 2013-08-19 MED ORDER — ACETAMINOPHEN 10 MG/ML IV SOLN
1000.0000 mg | Freq: Once | INTRAVENOUS | Status: AC
  Administered 2013-08-19: 1000 mg via INTRAVENOUS
  Filled 2013-08-19: qty 100

## 2013-08-19 MED ORDER — ACETAMINOPHEN 325 MG PO TABS: 650.0000 mg | ORAL_TABLET | ORAL | Status: DC | PRN

## 2013-08-19 MED ORDER — HYDROCODONE-ACETAMINOPHEN 5-325 MG PO TABS
1.0000 | ORAL_TABLET | ORAL | Status: DC | PRN
  Administered 2013-08-19 – 2013-08-20 (×4): 2 via ORAL
  Filled 2013-08-19 (×4): qty 2

## 2013-08-19 MED ORDER — BUPIVACAINE-EPINEPHRINE PF 0.5-1:200000 % IJ SOLN
INTRAMUSCULAR | Status: AC
  Filled 2013-08-19: qty 30

## 2013-08-19 MED ORDER — GLYCOPYRROLATE 0.2 MG/ML IJ SOLN
INTRAMUSCULAR | Status: DC | PRN
  Administered 2013-08-19: 0.6 mg via INTRAVENOUS

## 2013-08-19 MED ORDER — ROCURONIUM BROMIDE 100 MG/10ML IV SOLN
INTRAVENOUS | Status: AC
  Filled 2013-08-19: qty 1

## 2013-08-19 MED ORDER — PROPOFOL 10 MG/ML IV BOLUS
INTRAVENOUS | Status: AC
  Filled 2013-08-19: qty 20

## 2013-08-19 MED ORDER — NEOSTIGMINE METHYLSULFATE 1 MG/ML IJ SOLN
INTRAMUSCULAR | Status: DC | PRN
  Administered 2013-08-19: 4 mg via INTRAVENOUS

## 2013-08-19 MED ORDER — BUPIVACAINE-EPINEPHRINE 0.5% -1:200000 IJ SOLN
INTRAMUSCULAR | Status: DC | PRN
  Administered 2013-08-19: 20 mL

## 2013-08-19 MED ORDER — LACTATED RINGERS IV SOLN
INTRAVENOUS | Status: DC | PRN
  Administered 2013-08-19: 1000 mL

## 2013-08-19 MED ORDER — LACTATED RINGERS IV SOLN
INTRAVENOUS | Status: DC | PRN
  Administered 2013-08-19 (×2): via INTRAVENOUS

## 2013-08-19 SURGICAL SUPPLY — 34 items
APPLIER CLIP ROT 10 11.4 M/L (STAPLE) ×3
BENZOIN TINCTURE PRP APPL 2/3 (GAUZE/BANDAGES/DRESSINGS) ×3 IMPLANT
CABLE HIGH FREQUENCY MONO STRZ (ELECTRODE) ×3 IMPLANT
CANISTER SUCTION 2500CC (MISCELLANEOUS) ×3 IMPLANT
CHLORAPREP W/TINT 26ML (MISCELLANEOUS) ×3 IMPLANT
CLIP APPLIE ROT 10 11.4 M/L (STAPLE) ×1 IMPLANT
CLOSURE WOUND 1/2 X4 (GAUZE/BANDAGES/DRESSINGS) ×1
COVER MAYO STAND STRL (DRAPES) ×3 IMPLANT
DECANTER SPIKE VIAL GLASS SM (MISCELLANEOUS) ×3 IMPLANT
DRAPE C-ARM 42X120 X-RAY (DRAPES) ×3 IMPLANT
DRAPE LAPAROSCOPIC ABDOMINAL (DRAPES) ×3 IMPLANT
DRAPE UTILITY XL STRL (DRAPES) ×3 IMPLANT
ELECT REM PT RETURN 9FT ADLT (ELECTROSURGICAL) ×3
ELECTRODE REM PT RTRN 9FT ADLT (ELECTROSURGICAL) ×1 IMPLANT
GLOVE SURG ORTHO 8.0 STRL STRW (GLOVE) ×3 IMPLANT
GOWN STRL REUS W/TWL XL LVL3 (GOWN DISPOSABLE) ×6 IMPLANT
HEMOSTAT SURGICEL 4X8 (HEMOSTASIS) IMPLANT
KIT BASIN OR (CUSTOM PROCEDURE TRAY) ×3 IMPLANT
NS IRRIG 1000ML POUR BTL (IV SOLUTION) ×3 IMPLANT
POUCH SPECIMEN RETRIEVAL 10MM (ENDOMECHANICALS) ×3 IMPLANT
SCISSORS LAP 5X35 DISP (ENDOMECHANICALS) ×3 IMPLANT
SET CHOLANGIOGRAPH MIX (MISCELLANEOUS) ×3 IMPLANT
SET IRRIG TUBING LAPAROSCOPIC (IRRIGATION / IRRIGATOR) ×3 IMPLANT
SLEEVE XCEL OPT CAN 5 100 (ENDOMECHANICALS) ×3 IMPLANT
SOLUTION ANTI FOG 6CC (MISCELLANEOUS) ×3 IMPLANT
STRIP CLOSURE SKIN 1/2X4 (GAUZE/BANDAGES/DRESSINGS) ×2 IMPLANT
SUT MNCRL AB 4-0 PS2 18 (SUTURE) ×3 IMPLANT
TOWEL OR 17X26 10 PK STRL BLUE (TOWEL DISPOSABLE) ×3 IMPLANT
TOWEL OR NON WOVEN STRL DISP B (DISPOSABLE) ×3 IMPLANT
TRAY LAP CHOLE (CUSTOM PROCEDURE TRAY) ×3 IMPLANT
TROCAR BLADELESS OPT 5 100 (ENDOMECHANICALS) ×3 IMPLANT
TROCAR XCEL BLUNT TIP 100MML (ENDOMECHANICALS) ×3 IMPLANT
TROCAR XCEL NON-BLD 11X100MML (ENDOMECHANICALS) ×3 IMPLANT
TUBING INSUFFLATION 10FT LAP (TUBING) ×3 IMPLANT

## 2013-08-19 NOTE — Preoperative (Signed)
Beta Blockers   Reason not to administer Beta Blockers:Not Applicable 

## 2013-08-19 NOTE — Transfer of Care (Signed)
Immediate Anesthesia Transfer of Care Note  Patient: Erin Flores  Procedure(s) Performed: Procedure(s): LAPAROSCOPIC CHOLECYSTECTOMY WITH INTRAOPERATIVE CHOLANGIOGRAM (N/A)  Patient Location: PACU  Anesthesia Type:General  Level of Consciousness: awake, alert  and oriented  Airway & Oxygen Therapy: Patient Spontanous Breathing and Patient connected to face mask oxygen  Post-op Assessment: Report given to PACU RN and Post -op Vital signs reviewed and stable  Post vital signs: Reviewed and stable  Complications: No apparent anesthesia complications

## 2013-08-19 NOTE — Op Note (Signed)
Procedure Note  Pre-operative Diagnosis:  Abdominal pain, biliary dyskinesia  Post-operative Diagnosis:  same  Surgeon:  Velora Hecklerodd M. Reyana Leisey, MD, FACS  Assistant:  Romie LeveeAlicia Thomas, MD   Procedure:  Laparoscopic cholecystectomy with intra-operative cholangiography  Anesthesia:  General  Estimated Blood Loss:  minimal  Drains: none         Specimen: Gallbladder to pathology  Indications:  Patient is a 28 yo WF with abdominal pain.  USN does not show gallstones.  HIDA scan normal with EF of 74%, but pain with CCK administration.  Now for cholecystectomy.  Procedure Details:  The patient was seen in the pre-op holding area. The risks, benefits, complications, treatment options, and expected outcomes have been discussed with the patient. The patient agreed with the proposed plan and signed the informed consent form.  The patient was taken to Operating Room, identified as Erin Flores and the procedure verified as Laparoscopic Cholecystectomy with Intraoperative Cholangiogram. A "time out" was completed and the above information confirmed.  Following induction of general anesthesia, the patient was placed in the supine position. The abdomen was prepped and draped in the usual aseptic fashion.  An incision was made in the skin below the umbilicus. The midline fascia was incised and the peritoneal cavity entered and the Hasson canula was introduced under direct vision.  The Hasson canula was secured with a 0-Vicryl pursestring suture. Pneumoperitoneum was established with carbon dioxide. Additional trocars were introduced under direct vision along the right costal margin in the midline, mid-clavicular line, and anterior axillary line.   The gallbladder was identified and the fundus grasped and retracted cephalad. Adhesions were taken down bluntly and the electrocautery was utilized as needed, taking care not to injure any adjacent structures. The infundibulum was grasped and retracted laterally,  exposing the peritoneum overlying the triangle of Calot. The peritoneum was incised and structures exposed with blunt dissection. The cystic duct was clearly identified, bluntly dissected circumferentially, and clipped at the neck of the gallbladder.  An incision was made in the cystic duct and the cholangiogram catheter introduced. The catheter was secured using an ligaclip.  Real-time cholangiography was performed using C-arm fluoroscopy.  There was rapid filling of a normal caliber common bile duct.  There was reflux of contrast into the left and right hepatic ductal systems.  There was free flow distally into the duodenum without filling defect or obstruction.  Catheter was removed from the peritoneal cavity.  The cystic duct was then triply ligated with surgical clips and divided. The cystic artery was identified, dissected circumferentially, ligated with ligaclips, and divided.  The gallbladder was dissected away from the liver bed using the electrocautery for hemostasis. The gallbladder was completely removed from the liver and placed into an endocatch bag. The right upper quadrant was irrigated and the gallbladder bed was inspected. Hemostasis was achieved with the electrocautery. Warm saline irrigation was utilized until clear.  Pneumoperitoneum was released after viewing removal of the trocars with good hemostasis noted. The umbilical wound was irrigated and the fascia was then closed with the pursestring suture.  Local anesthetic was infiltrated at all port sites. The skin incisions were closed with 4-0 Monocril subcuticular sutures and steri-strips and dressings were applied.  Instrument, sponge, and needle counts were correct at the conclusion of the case.  The patient was awakened from anesthesia and brought to the recovery room in stable condition.  The patient tolerated the procedure well.   Velora Hecklerodd M. Kaleo Condrey, MD, [facility] Office:  [phone]   

## 2013-08-19 NOTE — Anesthesia Postprocedure Evaluation (Signed)
  Anesthesia Post-op Note  Patient: Erin Flores  Procedure(s) Performed: Procedure(s) (LRB): LAPAROSCOPIC CHOLECYSTECTOMY WITH INTRAOPERATIVE CHOLANGIOGRAM (N/A)  Patient Location: PACU  Anesthesia Type: General  Level of Consciousness: awake and alert   Airway and Oxygen Therapy: Patient Spontanous Breathing  Post-op Pain: mild  Post-op Assessment: Post-op Vital signs reviewed, Patient's Cardiovascular Status Stable, Respiratory Function Stable, Patent Airway and No signs of Nausea or vomiting  Last Vitals:  Filed Vitals:   08/19/13 1245  BP: 129/83  Pulse: 85  Temp: 36.9 C  Resp: 12    Post-op Vital Signs: stable   Complications: No apparent anesthesia complications

## 2013-08-19 NOTE — Discharge Instructions (Signed)
  [facility]  LAPAROSCOPIC SURGERY - POST-OP INSTRUCTIONS  Always review your discharge instruction sheet given to you by the facility where your surgery was performed.  A prescription for pain medication may be given to you upon discharge.  Take your pain medication as prescribed.  If narcotic pain medicine is not needed, then you may take acetaminophen (Tylenol) or ibuprofen (Advil) as needed.  Take your usually prescribed medications unless otherwise directed.  If you need a refill on your pain medication, please contact your pharmacy.  They will contact our office to request authorization. Prescriptions will not be filled after 5 P.M. or on weekends.  You should follow a light diet the first few days after arrival home, such as soup and crackers or toast.  Be sure to include plenty of fluids daily.  Most patients will experience some swelling and bruising in the area of the incisions.  Ice packs will help.  Swelling and bruising can take several days to resolve.   It is common to experience some constipation if taking pain medication after surgery.  Increasing fluid intake and taking a stool softener (such as Colace) will usually help or prevent this problem from occurring.  A mild laxative (Milk of Magnesia or Miralax) should be taken according to package instructions if there are no bowel movements after 48 hours.  Unless discharge instructions indicate otherwise, you may remove your bandages 24-48 hours after surgery, and you may shower at that time.  You may have steri-strips (small skin tapes) in place directly over the incision.  These strips should be left on the skin for 7-10 days.  If your surgeon used skin glue on the incision, you may shower in 24 hours.  The glue will flake off over the next 2-3 weeks.  Any sutures or staples will be removed at the office during your follow-up visit.  ACTIVITIES:  You may resume regular (light) daily activities beginning the  next day-such as daily self-care, walking, climbing stairs-gradually increasing activities as tolerated.  You may have sexual intercourse when it is comfortable.  Refrain from any heavy lifting or straining until approved by your doctor.  You may drive when you are no longer taking prescription pain medication, you can comfortably wear a seatbelt, and you can safely maneuver your car and apply brakes.  You should see your doctor in the office for a follow-up appointment approximately 2-3 weeks after your surgery.  Make sure that you call for this appointment within a day or two after you arrive home to insure a convenient appointment time.  WHEN TO CALL YOUR DOCTOR: 1. Fever over 101.0 2. Inability to urinate 3. Continued bleeding from incision 4. Increased pain, redness, or drainage from the incision 5. Increasing abdominal pain  The clinic staff is available to answer your questions during regular business hours.  Please don't hesitate to call and ask to speak to one of the nurses for clinical concerns.  If you have a medical emergency, go to the nearest emergency room or call 911.  A surgeon from [facility] is always on call for the hospital.  Cayson Kalb M. Wm Sahagun, MD, FACS [facility] Office: [phone] Toll Free:  [phone] [phone]  Web site: [email or web address] 

## 2013-08-19 NOTE — H&P (View-Only) (Signed)
General Surgery [facility]  Chief Complaint  Patient presents with  . New Evaluation    eval biliary dyskinesia - referral from Dr. 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 Patient has been symptomatic for approximately 2 months. Pain waxes and wanes. Patient was seen by gastroenterology and underwent abdominal ultrasound which was normal. Nuclear medicine hepatobiliary scan was performed and had a normal ejection fraction of 74% but the patient experienced pain and nausea consistent with her symptoms at the time of administration of CCK. Patient is referred for consideration of cholecystectomy.  Patient has no prior history of hepatobiliary disease. She has no history of jaundice or acholic stools. She has had irregularity with diarrhea and constipation. She has had no prior abdominal surgery other than removal of an intrauterine device yesterday.  There is a family history of biliary disease in the patient's sister.  Past Medical History  Diagnosis Date  . Psoriatic arthritis     Current Outpatient Prescriptions  Medication Sig Dispense Refill  . Adalimumab (HUMIRA PEN [place]) Inject into the skin.      Marland Kitchen ibuprofen (ADVIL,MOTRIN) 200 MG tablet Take 200 mg by mouth every 6 (six) hours as needed for pain.       No current facility-administered medications for this visit.    No Known Allergies  Family History  Problem Relation Age of Onset  . Cancer Maternal Aunt     breast    History   Social History  . Marital Status: Married    Spouse Name: N/A    Number of Children: N/A  . Years of Education: N/A   Social History Main Topics  . Smoking status: Former Smoker    Quit date: 06/02/2006  . Smokeless tobacco: None  . Alcohol Use: Yes     Comment: occ  . Drug Use: No  . Sexual Activity: None    Other Topics Concern  . None   Social History Narrative  . None    REVIEW OF SYSTEMS - PERTINENT POSITIVES ONLY: Denies jaundice. Denies acholic stools. Denies fevers or chills. Nausea but no emesis. Right upper quadrant pain, relatively constant with radiation to the back and right chest.  EXAM: Filed Vitals:   08/16/13 1325  BP: 108/72  Pulse: 76  Temp: 98.8 F (37.1 C)  Resp: 14    GENERAL: well-developed, well-nourished, no acute distress HEENT: normocephalic; pupils equal and reactive; sclerae clear; dentition good; mucous membranes moist NECK:  No palpable masses in the thyroid bed; symmetric on extension; no palpable anterior or posterior cervical lymphadenopathy; no supraclavicular masses; no tenderness CHEST: clear to auscultation bilaterally without rales, rhonchi, or wheezes CARDIAC: regular rate and rhythm without significant murmur; peripheral pulses are full ABDOMEN: soft without distension; bowel sounds present; no mass; no hepatosplenomegaly; no hernia; mild tenderness to palpation at right costal margin EXT:  non-tender without edema; no deformity NEURO: no gross focal deficits; no sign of tremor   LABORATORY RESULTS: See [facility]) for most recent results  RADIOLOGY RESULTS: See [facility] (CH[facility]-Epic) for most recent results  IMPRESSION: #1 right upper quadrant abdominal pain with radiation to back and associated nausea, suspected biliary dyskinesia  PLAN: The patient and I discussed these findings at length. We reviewed her ultrasound results and her hepatobiliary scan results. We discussed her symptoms. Patient is interested in laparoscopic  cholecystectomy for management of right upper quadrant abdominal pain and nausea. I explained to her that most people have cholelithiasis. It does not appear from her studies that she has cholelithiasis. Patient has approximately a 60% chance of symptomatic improvement with cholecystectomy. Risk  and benefits of the procedure discussed including the possibility of conversion to open surgery. We will plan on intraoperative cholangiography. Patient would like to proceed with surgery next week.  The risks and benefits of the procedure have been discussed at length with the patient.  The patient understands the proposed procedure, potential alternative treatments, and the course of recovery to be expected.  All of the patient's questions have been answered at this time.  The patient wishes to proceed with surgery.  Velora Hecklerodd M. Irish Breisch, MD, FACS General & Endocrine Surgery [facility]  Primary Care Physician: Theda BelfastHUNG,PATRICK D, MD

## 2013-08-19 NOTE — Interval H&P Note (Signed)
History and Physical Interval Note:  08/19/2013 9:31 AM  Erin Flores  has presented today for surgery, with the diagnosis of abdominal pain biliary dyskinesia.   The various methods of treatment have been discussed with the patient and family. After consideration of risks, benefits and other options for treatment, the patient has consented to    Procedure(s): LAPAROSCOPIC CHOLECYSTECTOMY WITH INTRAOPERATIVE CHOLANGIOGRAM (N/A) as a surgical intervention .    The patient's history has been reviewed, patient examined, no change in status, stable for surgery.  I have reviewed the patient's chart and labs.  Questions were answered to the patient's satisfaction.    Velora Hecklerodd M. Kensy Blizard, MD, [facility] Office: [phone]    Adryan Shin MJudie Petit

## 2013-08-19 NOTE — Anesthesia Preprocedure Evaluation (Addendum)
Anesthesia Evaluation  Patient identified by MRN, date of birth, ID band Patient awake    Reviewed: Allergy & Precautions, H&P , NPO status , Patient's Chart, lab work & pertinent test results  Airway Mallampati: II TM Distance: >3 FB Neck ROM: Full    Dental no notable dental hx.    Pulmonary former smoker,  breath sounds clear to auscultation  Pulmonary exam normal       Cardiovascular Exercise Tolerance: Good + Valvular Problems/Murmurs Rhythm:Regular Rate:Normal  H/O heart murmur.   Neuro/Psych negative neurological ROS  negative psych ROS   GI/Hepatic Neg liver ROS, GERD-  Medicated,  Endo/Other  negative endocrine ROS  Renal/GU negative Renal ROS  negative genitourinary   Musculoskeletal negative musculoskeletal ROS (+)   Abdominal   Peds negative pediatric ROS (+)  Hematology  (+) anemia ,   Anesthesia Other Findings   Reproductive/Obstetrics Negative pregnancy test.                        Anesthesia Physical Anesthesia Plan  ASA: II  Anesthesia Plan: General   Post-op Pain Management:    Induction: Intravenous  Airway Management Planned: Oral ETT  Additional Equipment:   Intra-op Plan:   Post-operative Plan: Extubation in OR  Informed Consent: I have reviewed the patients History and Physical, chart, labs and discussed the procedure including the risks, benefits and alternatives for the proposed anesthesia with the patient or authorized representative who has indicated his/her understanding and acceptance.   Dental advisory given  Plan Discussed with: CRNA  Anesthesia Plan Comments: (H/O motion sickness. No glaucoma. Scopolamine patch on. Precautions given.)       Anesthesia Quick Evaluation

## 2013-08-20 MED ORDER — HYDROCODONE-ACETAMINOPHEN 5-325 MG PO TABS: 1.0000 | ORAL_TABLET | ORAL | Status: DC | PRN

## 2013-08-20 NOTE — Progress Notes (Signed)
1 Day Post-Op  Subjective: Doing well.   Objective: Vital signs in last 24 hours: Temp:  [97 F (36.1 C)-98.5 F (36.9 C)] 97 F (36.1 C) (03/21 0502) Pulse Rate:  [62-140] 77 (03/21 0502) Resp:  [9-20] 20 (03/21 0502) BP: (103-164)/(65-105) 107/71 mmHg (03/21 0502) SpO2:  [95 %-100 %] 97 % (03/21 0502) Weight:  [173 lb 9.6 oz (78.744 kg)] 173 lb 9.6 oz (78.744 kg) (03/20 0736) Last BM Date: 08/18/13  Intake/Output from previous day: 03/20 0701 - 03/21 0700 In: 2550 [P.O.:960; I.V.:1590] Out: 2210 [Urine:2200; Blood:10] Intake/Output this shift:    Incision/Wound:soft min TTP port site dressings dry  Lab Results:   Recent Labs  08/19/13 0755  WBC 6.6  HGB 14.0  HCT 42.3  PLT 233   BMET No results found for this basename: NA, K, CL, CO2, GLUCOSE, BUN, CREATININE, CALCIUM,  in the last 72 hours PT/INR No results found for this basename: LABPROT, INR,  in the last 72 hours ABG No results found for this basename: PHART, PCO2, PO2, HCO3,  in the last 72 hours  Studies/Results: Dg Cholangiogram Operative  08/19/2013   CLINICAL DATA:  Laparoscopic cholecystectomy.  EXAM: INTRAOPERATIVE CHOLANGIOGRAM  TECHNIQUE: Cholangiographic images from the C-arm fluoroscopic device were submitted for interpretation post-operatively. Please see the procedural report for the amount of contrast and the fluoroscopy time utilized.  COMPARISON:  NM HEPATO W/GB/PHARM/QUAN MEASUR dated 08/05/2013; US ABDOMEN COMPLETE dated 08/05/2013  FINDINGS: Single run of 138 images is submitted. This demonstrates injection into the cystic duct remnant. No biliary ductal dilatation. No obstruction, with contrast filling the descending duodenum. No persistent filling defect to suggest choledocholithiasis. No contrast extravasation.  IMPRESSION: No evidence of choledocholithiasis or biliary obstruction.   Electronically Signed   By: Jeronimo GreavesKyle  Talbot M.D.   On: 08/19/2013 13:44    Anti-infectives: Anti-infectives   Start     Dose/Rate Route Frequency Ordered Stop   08/19/13 0736  ceFAZolin (ANCEF) IVPB 2 g/50 mL premix     2 g 100 mL/hr over 30 Minutes Intravenous On call to O.R. 08/19/13 0736 08/19/13 1005      Assessment/Plan: s/p Procedure(s): LAPAROSCOPIC CHOLECYSTECTOMY WITH INTRAOPERATIVE CHOLANGIOGRAM (N/A) Discharge  LOS: 1 day    Tehya Leath A. 08/20/2013

## 2013-08-20 NOTE — Discharge Summary (Signed)
Physician Discharge Summary  Patient ID: Erin Flores MRN: [id number] DOB/AGE: 02/26/1986 28 y.o.  Admit date: 08/19/2013 Discharge date: 08/20/2013  Admission Diagnoses:biliary  dyskinesia  Discharge Diagnoses:  Principal Problem:   Abdominal pain Active Problems:   Biliary dyskinesia   Abdominal pain   Discharged Condition: good  Hospital Course: unremarkable.   Consults: None  Significant Diagnostic Studies: none  Treatments: surgery: laparoscopic cholecystectomy  Discharge Exam: Blood pressure 107/71, pulse 77, temperature 97 F (36.1 C), temperature source Oral, resp. rate 20, height 5\' 3"  (1.6 m), weight 173 lb 9.6 oz (78.744 kg), last menstrual period 08/19/2013, SpO2 97.00%. Incision/Wound:soft minTTP port sites clean and dry  Disposition: 01-Home or Self Care  Discharge Orders   Future Appointments Provider Department Dept Phone   09/07/2013 10:15 AM Velora Hecklerodd M Gerkin, MD [facility] [place] [phone]   Future Orders Complete By Expires   Diet - low sodium heart healthy  As directed    Increase activity slowly  As directed        Medication List    STOP taking these medications       oxyCODONE-acetaminophen 5-325 MG per tablet  Commonly known as:  PERCOCET/ROXICET      TAKE these medications       ALPRAZolam 0.25 MG tablet  Commonly known as:  XANAX  Take 0.25 mg by mouth. Patient took as a one time dose prior to procedure on 08/15/13.     HUMIRA PEN [place]  Inject into the skin. Whole pen Every other Thursday last dose 08/11/13     HYDROcodone-acetaminophen 5-325 MG per tablet  Commonly known as:  NORCO/VICODIN  Take 1-2 tablets by mouth every 4 (four) hours as needed for moderate pain.     HYDROcodone-acetaminophen 5-325 MG per tablet  Commonly known as:  NORCO/VICODIN  Take 1-2 tablets by mouth every 4 (four) hours as needed for moderate pain.     pantoprazole 40 MG tablet  Commonly known as:  PROTONIX  Take 40 mg by mouth as needed.            Follow-up Information   Follow up with Velora HecklerGERKIN,TODD M, MD. Schedule an appointment as soon as possible for a visit in 3 weeks. (For wound re-check)    Specialty:  General Surgery   Contact information:   [address] [place] [address] [phone]       Signed: Dortha SchwalbeCORNETT,Denice Cardon A. 08/20/2013, 7:35 AM

## 2013-08-21 ENCOUNTER — Telehealth (INDEPENDENT_AMBULATORY_CARE_PROVIDER_SITE_OTHER): Payer: Self-pay | Admitting: Surgery

## 2013-08-21 NOTE — Telephone Encounter (Signed)
Ms. Erin Flores had a lap chole on Friday, 3/20, for biliary dyskinesia.  He husband, Erin Flores, talked to me.  He said that she has been light headed.  She is taking liquids, not vomiting, and has had no fever.  She has had no change in her pain.  It does no sound like much wrong.  I have told him to encourage her to take po liquids  He will call back is she has more trouble.  Ovidio Kinavid Avon Molock, MD, [facility] Pager: [phone] Office phone:  [phone]

## 2013-08-22 ENCOUNTER — Encounter (HOSPITAL_COMMUNITY): Payer: Self-pay | Admitting: Surgery

## 2013-08-24 ENCOUNTER — Telehealth (INDEPENDENT_AMBULATORY_CARE_PROVIDER_SITE_OTHER): Payer: Self-pay | Admitting: *Deleted

## 2013-08-24 NOTE — Telephone Encounter (Signed)
Patient called to report a burning feeling at her incision site at her umbilical area and also a feeling of becoming light headed after being up moving.  I explained that the burning sensation is most likely due to reconnection of nerve endings so to try ice and Ibuprofen per protocol.  I also called and spoke to [place] who agreed with this.  Explained to patient that it can be very normal following surgery to become light headed when she is up and moving too much so she may need to try to do short activities then rest.  Explained that the body is still focused on healing which takes quite a bit of energy so to be careful pushed to hard at this point.  Explained the symptoms of infection to the patient and encouraged her to keep a close eye on the area then let us know if anything progresses with her incision site.  Patient states understanding and agreeable at this time.

## 2013-09-07 ENCOUNTER — Ambulatory Visit (INDEPENDENT_AMBULATORY_CARE_PROVIDER_SITE_OTHER): Payer: PRIVATE HEALTH INSURANCE | Admitting: Surgery

## 2013-09-07 ENCOUNTER — Encounter (INDEPENDENT_AMBULATORY_CARE_PROVIDER_SITE_OTHER): Payer: Self-pay | Admitting: Surgery

## 2013-09-07 VITALS — BP 118/80 | HR 76 | Ht 63.0 in | Wt 177.0 lb

## 2013-09-07 DIAGNOSIS — K828 Other specified diseases of gallbladder: Secondary | ICD-10-CM

## 2013-09-07 DIAGNOSIS — R109 Unspecified abdominal pain: Secondary | ICD-10-CM

## 2013-09-07 NOTE — Patient Instructions (Signed)
  CARE OF INCISION   Apply cocoa butter/vitamin E cream (Palmer's brand) to your incision 2 - 3 times daily.  Massage cream into incision for one minute with each application.  Use sunscreen (50 SPF or higher) for first 6 months after surgery if area is exposed to sun.  You may alternate Mederma or other scar reducing cream with cocoa butter cream if desired.       Hervey Wedig M. Veda Arrellano, MD, FACS      [facility]      Office: [phone]    

## 2013-09-07 NOTE — Progress Notes (Signed)
General Surgery [facility]  Chief Complaint  Patient presents with  . Routine Post Op    lap chole with IOC on 08/19/2013    HISTORY: Patient is a 28 year old female who underwent laparoscopic cholecystectomy for biliary dyskinesia on 08/19/2013. Final pathology showed chronic cholecystitis and cholesterolosis. There were no gallstones identified.  Postoperative course has been uneventful. A small area of ecchymosis and skin irritation beneath the umbilical wound has resolved. Patient has returned to work and has returned to the gym to physical activity.  EXAM: Surgical wounds are healing uneventfully. No sign of infection. No sign of seroma. No sign of herniation. Right upper quadrant is soft and nontender without mass. Slight excoriation of the skin below the umbilicus which has nearly resolved.  IMPRESSION: Status post laparoscopic cholecystectomy for biliary dyskinesia, improvement in symptoms  PLAN: Patient will begin applying topical creams to her incisions. She will return to normal physical activity.  Patient will return for surgical care as needed.  Velora Hecklerodd M. Ramie Palladino, MD, FACS General & Endocrine Surgery [facility]   Visit Diagnoses: 1. Biliary dyskinesia   2. Abdominal pain

## 2015-04-14 ENCOUNTER — Emergency Department (HOSPITAL_BASED_OUTPATIENT_CLINIC_OR_DEPARTMENT_OTHER): Payer: PRIVATE HEALTH INSURANCE

## 2015-04-14 ENCOUNTER — Encounter (HOSPITAL_BASED_OUTPATIENT_CLINIC_OR_DEPARTMENT_OTHER): Payer: Self-pay

## 2015-04-14 ENCOUNTER — Emergency Department (HOSPITAL_BASED_OUTPATIENT_CLINIC_OR_DEPARTMENT_OTHER)
Admission: EM | Admit: 2015-04-14 | Discharge: 2015-04-14 | Disposition: A | Discharge status: Home / Self Care | Payer: PRIVATE HEALTH INSURANCE | Attending: Emergency Medicine | Admitting: Emergency Medicine

## 2015-04-14 DIAGNOSIS — Z862 Personal history of diseases of the blood and blood-forming organs and certain disorders involving the immune mechanism: Secondary | ICD-10-CM | POA: Diagnosis not present

## 2015-04-14 DIAGNOSIS — Z8739 Personal history of other diseases of the musculoskeletal system and connective tissue: Secondary | ICD-10-CM | POA: Diagnosis not present

## 2015-04-14 DIAGNOSIS — N12 Tubulo-interstitial nephritis, not specified as acute or chronic: Secondary | ICD-10-CM

## 2015-04-14 DIAGNOSIS — R109 Unspecified abdominal pain: Secondary | ICD-10-CM | POA: Diagnosis present

## 2015-04-14 DIAGNOSIS — Z9049 Acquired absence of other specified parts of digestive tract: Secondary | ICD-10-CM | POA: Diagnosis not present

## 2015-04-14 DIAGNOSIS — Z79899 Other long term (current) drug therapy: Secondary | ICD-10-CM | POA: Insufficient documentation

## 2015-04-14 DIAGNOSIS — Z872 Personal history of diseases of the skin and subcutaneous tissue: Secondary | ICD-10-CM | POA: Insufficient documentation

## 2015-04-14 DIAGNOSIS — Z8719 Personal history of other diseases of the digestive system: Secondary | ICD-10-CM | POA: Insufficient documentation

## 2015-04-14 DIAGNOSIS — Z87891 Personal history of nicotine dependence: Secondary | ICD-10-CM | POA: Insufficient documentation

## 2015-04-14 DIAGNOSIS — R000 Tachycardia, unspecified: Secondary | ICD-10-CM | POA: Insufficient documentation

## 2015-04-14 DIAGNOSIS — Z87442 Personal history of urinary calculi: Secondary | ICD-10-CM | POA: Insufficient documentation

## 2015-04-14 DIAGNOSIS — R011 Cardiac murmur, unspecified: Secondary | ICD-10-CM | POA: Diagnosis not present

## 2015-04-14 DIAGNOSIS — Z3202 Encounter for pregnancy test, result negative: Secondary | ICD-10-CM | POA: Insufficient documentation

## 2015-04-14 LAB — COMPREHENSIVE METABOLIC PANEL
ALK PHOS: 66 U/L (ref 38–126)
ALT: 55 U/L — ABNORMAL HIGH (ref 14–54)
ANION GAP: 7 (ref 5–15)
AST: 25 U/L (ref 15–41)
Albumin: 4.4 g/dL (ref 3.5–5.0)
BILIRUBIN TOTAL: 0.8 mg/dL (ref 0.3–1.2)
BUN: 9 mg/dL (ref 6–20)
CALCIUM: 9.1 mg/dL (ref 8.9–10.3)
CO2: 26 mmol/L (ref 22–32)
Chloride: 105 mmol/L (ref 101–111)
Creatinine, Ser: 0.71 mg/dL (ref 0.44–1.00)
GLUCOSE: 90 mg/dL (ref 65–99)
Potassium: 3.7 mmol/L (ref 3.5–5.1)
Sodium: 138 mmol/L (ref 135–145)
TOTAL PROTEIN: 7.6 g/dL (ref 6.5–8.1)

## 2015-04-14 LAB — CBC WITH DIFFERENTIAL/PLATELET
BASOS PCT: 0 %
Basophils Absolute: 0 10*3/uL (ref 0.0–0.1)
EOS ABS: 0 10*3/uL (ref 0.0–0.7)
Eosinophils Relative: 0 %
HEMATOCRIT: 39.2 % (ref 36.0–46.0)
Hemoglobin: 13 g/dL (ref 12.0–15.0)
Lymphocytes Relative: 24 %
Lymphs Abs: 2.2 10*3/uL (ref 0.7–4.0)
MCH: 28 pg (ref 26.0–34.0)
MCHC: 33.2 g/dL (ref 30.0–36.0)
MCV: 84.5 fL (ref 78.0–100.0)
MONO ABS: 1.2 10*3/uL — AB (ref 0.1–1.0)
Monocytes Relative: 13 %
Neutro Abs: 5.7 10*3/uL (ref 1.7–7.7)
Neutrophils Relative %: 63 %
Platelets: 230 10*3/uL (ref 150–400)
RBC: 4.64 MIL/uL (ref 3.87–5.11)
RDW: 13.6 % (ref 11.5–15.5)
WBC: 9.1 10*3/uL (ref 4.0–10.5)

## 2015-04-14 LAB — URINALYSIS, ROUTINE W REFLEX MICROSCOPIC
Bilirubin Urine: NEGATIVE
Glucose, UA: NEGATIVE mg/dL
Ketones, ur: NEGATIVE mg/dL
NITRITE: NEGATIVE
PH: 6.5 (ref 5.0–8.0)
Protein, ur: NEGATIVE mg/dL
SPECIFIC GRAVITY, URINE: 1.006 (ref 1.005–1.030)
Urobilinogen, UA: 0.2 mg/dL (ref 0.0–1.0)

## 2015-04-14 LAB — URINE MICROSCOPIC-ADD ON

## 2015-04-14 LAB — I-STAT CG4 LACTIC ACID, ED: LACTIC ACID, VENOUS: 0.67 mmol/L (ref 0.5–2.0)

## 2015-04-14 LAB — PREGNANCY, URINE: PREG TEST UR: NEGATIVE

## 2015-04-14 MED ORDER — CEPHALEXIN 500 MG PO CAPS: 500.0000 mg | ORAL_CAPSULE | Freq: Three times a day (TID) | ORAL | Status: DC

## 2015-04-14 MED ORDER — DEXTROSE 5 % IV SOLN: 1.0000 g | Freq: Once | INTRAVENOUS | Status: DC

## 2015-04-14 MED ORDER — CEFTRIAXONE SODIUM 1 G IJ SOLR
INTRAMUSCULAR | Status: AC
  Filled 2015-04-14: qty 10

## 2015-04-14 MED ORDER — SODIUM CHLORIDE 0.9 % IV BOLUS (SEPSIS)
1000.0000 mL | INTRAVENOUS | Status: DC
  Administered 2015-04-14: 1000 mL via INTRAVENOUS

## 2015-04-14 MED ORDER — DEXTROSE 5 % IV SOLN
2.0000 g | Freq: Once | INTRAVENOUS | Status: AC
  Administered 2015-04-14: 2 g via INTRAVENOUS

## 2015-04-14 MED ORDER — SODIUM CHLORIDE 0.9 % IV BOLUS (SEPSIS): 1000.0000 mL | Freq: Once | INTRAVENOUS | Status: DC

## 2015-04-14 MED ORDER — DEXTROSE 5 % IV SOLN: 1.0000 g | INTRAVENOUS | Status: DC

## 2015-04-14 MED ORDER — OXYCODONE HCL 5 MG PO TABS: 2.5000 mg | ORAL_TABLET | Freq: Four times a day (QID) | ORAL | Status: DC | PRN

## 2015-04-14 NOTE — ED Notes (Signed)
Patient reports that she developed bilateral flank pain Wednesday and thinks she passed 2 stones, hx of same. Developed fever Thursday, also reports no BM since Monday. Chills for same. Nausea for same

## 2015-04-14 NOTE — ED Provider Notes (Signed)
CSN: [id number]     Arrival date & time 04/14/15  1429 History   First MD Initiated Contact with Patient 04/14/15 1607     Chief Complaint  Patient presents with  . Flank Pain     (Consider location/radiation/quality/duration/timing/severity/associated sxs/prior Treatment) HPI   Blood pressure 141/87, pulse 115, temperature 99.1 F (37.3 C), temperature source Oral, resp. rate 20, height  (1.6 m), weight 197 lb (89.359 kg), SpO2 99 %.  Erin Flores is a 29 y.o. female with past medical history significant for psoriatic arthritis, takes Humira shots every other week complaining of bilateral flank pain worsening over the course of the last 5 days associated with hematuria, urinary frequency, sensation of incomplete void. Pain is severe, she is taken 3 800 mg ibuprofen today with little relief. She had a fever of 102.3 earlier in the day. Patient thinks she may have passed a stone 5 days ago. There is no documented kidney stone however that she passed particulate matter in her urine and her mother thinks that it was likely a stone based on what they visualized. She has no prior histories of kidney stones. Patient denies cough, chest pain, shortness of breath, abnormal vaginal discharge. Denies vomiting but she's had severe nausea.  Past Medical History  Diagnosis Date  . Psoriatic arthritis (HCC)   . GERD (gastroesophageal reflux disease)   . Anemia     hx of iron deficiency anemia   . Heart murmur     as infant no problem  . Arthritis     psoriatic arthritis   . Kidney stone    Past Surgical History  Procedure Laterality Date  . Cholecystectomy N/A 08/19/2013    Procedure: LAPAROSCOPIC CHOLECYSTECTOMY WITH INTRAOPERATIVE CHOLANGIOGRAM;  Surgeon: Velora Heckler, MD;  Location: [facility];  Service: General;  Laterality: N/A;   Family History  Problem Relation Age of Onset  . Cancer Maternal Aunt     breast   Social History  Substance Use Topics  . Smoking status: Former  Smoker    Quit date: 06/02/2006  . Smokeless tobacco: None  . Alcohol Use: Yes     Comment: occ   OB History    No data available     Review of Systems  10 systems reviewed and found to be negative, except as noted in the HPI.   Allergies  Review of patient's allergies indicates no known allergies.  Home Medications   Prior to Admission medications   Medication Sig Start Date End Date Taking? Authorizing Provider  lamoTRIgine (LAMICTAL) 100 MG tablet Take 100 mg by mouth daily.   Yes Historical Provider, MD  Adalimumab (HUMIRA PEN [place]) Inject into the skin. Whole pen Every other Thursday last dose 08/11/13    Historical Provider, MD  cephALEXin (KEFLEX) 500 MG capsule Take 1 capsule (500 mg total) by mouth 3 (three) times daily. 04/14/15   Rmoni Keplinger, PA-C  oxyCODONE (ROXICODONE) 5 MG immediate release tablet Take 0.5-1 tablets (2.5-5 mg total) by mouth every 6 (six) hours as needed. 04/14/15   Jaimi Belle, PA-C   BP 109/70 mmHg  Pulse 98  Temp(Src) 99.1 F (37.3 C) (Oral)  Resp 19  Ht  (1.6 m)  Wt 197 lb (89.359 kg)  BMI 34.91 kg/m2  SpO2 99% Physical Exam  Constitutional: She is oriented to person, place, and time. She appears well-developed and well-nourished. No distress.  Overall well appearing  HENT:  Head: Normocephalic and atraumatic.  Mouth/Throat: Oropharynx is clear  and moist.  Eyes: Conjunctivae and EOM are normal. Pupils are equal, round, and reactive to light.  Neck: Normal range of motion.  Cardiovascular: Normal rate, regular rhythm and intact distal pulses.   Pulmonary/Chest: Effort normal and breath sounds normal. No respiratory distress. She has no wheezes. She has no rales. She exhibits no tenderness.  Abdominal: Soft. She exhibits no distension and no mass. There is no tenderness. There is no rebound and no guarding.  Genitourinary:  Positive CVA tenderness to percussion worse on the right than the left  Musculoskeletal: Normal range  of motion.  Neurological: She is alert and oriented to person, place, and time.  Skin: She is not diaphoretic.  Psychiatric: She has a normal mood and affect.  Nursing note and vitals reviewed.   ED Course  Procedures (including critical care time) Labs Review Labs Reviewed  URINALYSIS, ROUTINE W REFLEX MICROSCOPIC (NOT AT [facility]) - Abnormal; Notable for the following:    APPearance CLOUDY (*)    Hgb urine dipstick MODERATE (*)    Leukocytes, UA LARGE (*)    All other components within normal limits  URINE MICROSCOPIC-ADD ON - Abnormal; Notable for the following:    Bacteria, UA MANY (*)    All other components within normal limits  CBC WITH DIFFERENTIAL/PLATELET - Abnormal; Notable for the following:    Monocytes Absolute 1.2 (*)    All other components within normal limits  COMPREHENSIVE METABOLIC PANEL - Abnormal; Notable for the following:    ALT 55 (*)    All other components within normal limits  URINE CULTURE  CULTURE, BLOOD (ROUTINE X 2)  CULTURE, BLOOD (ROUTINE X 2)  PREGNANCY, URINE  I-STAT CG4 LACTIC ACID, ED    Imaging Review Dg Chest 2 View  04/14/2015  CLINICAL DATA:  Bilateral flank pain for 4 days. Fever and chills 3 days ago. Initial encounter. EXAM: CHEST  2 VIEW COMPARISON:  PA and lateral chest 01/06/2013 and 09/15/2008. FINDINGS: The lungs are clear. Heart size is normal. No pneumothorax or pleural effusion. Scoliosis noted. IMPRESSION: No acute disease. Electronically Signed   By: Drusilla Kannerhomas  Dalessio M.D.   On: 04/14/2015 17:35   Ct Renal Stone Study  04/14/2015  CLINICAL DATA:  Patient reports that she developed bilateral flank pain Wednesday and thinks she passed 2 stones, hx of same. Developed fever Thursday, also reports no BM since Monday. Chills for same. Nausea for same. EXAM: CT ABDOMEN AND PELVIS WITHOUT CONTRAST TECHNIQUE: Multidetector CT imaging of the abdomen and pelvis was performed following the standard protocol without IV contrast. COMPARISON:   04/12/2015 FINDINGS: Lower chest: No pulmonary nodules, pleural effusions, or infiltrates. Heart size is normal. No imaged pericardial effusion or significant coronary artery calcifications. Upper abdomen: Status post cholecystectomy. No focal abnormality identified within the liver, spleen, pancreas, or adrenal glands. Small nonobstructing intrarenal calculus is identified in the midpole region of the left kidney. No ureteral stones. Gastrointestinal tract: Stomach and small bowel loops are normal in appearance. The appendix is well seen and has a normal appearance. Colonic loops are normal in appearance. Moderate stool burden in nondilated loops. Pelvis: Uterus is present.  No adnexal mass.  No free pelvic fluid. Retroperitoneum: No evidence for aortic aneurysm. No retroperitoneal or mesenteric adenopathy. Abdominal wall: Unremarkable. Osseous structures: Unremarkable. IMPRESSION: No evidence for acute  abnormality. Moderate stool burden. Electronically Signed   By: Norva PavlovElizabeth  Brown M.D.   On: 04/14/2015 17:15   I have personally reviewed and evaluated these images and lab  results as part of my medical decision-making.   EKG Interpretation None      MDM   Final diagnoses:  Flank pain  Pyelonephritis    Filed Vitals:   04/14/15 1439 04/14/15 1848  BP: 141/87 109/70  Pulse: 115 98  Temp: 99.1 F (37.3 C)   TempSrc: Oral   Resp: 20 19  Height:  (1.6 m)   Weight: 197 lb (89.359 kg)   SpO2: 99% 99%    Medications  sodium chloride 0.9 % bolus 1,000 mL (0 mLs Intravenous Stopped 04/14/15 1924)  cefTRIAXone (ROCEPHIN) 1 g in dextrose 5 % 50 mL IVPB (not administered)  cefTRIAXone (ROCEPHIN) 1 G injection (  Not Given 04/14/15 1924)  cefTRIAXone (ROCEPHIN) 1 G injection (  Not Given 04/14/15 1923)  cefTRIAXone (ROCEPHIN) 2 g in dextrose 5 % 50 mL IVPB (0 g Intravenous Stopped 04/14/15 1844)    SHAMIRACLE GORDEN is 29 y.o. female presenting with fever, bilateral flank pain, sensation  of incomplete void. Patient thinks she may pass to kidney stone however, this may been just particulates from a severe urinary tract infection. Patient is tachycardic on my exam, she has bilateral CVA tenderness. Unfortunately this patient is immunosuppressed with Humira shots. Patient is bolused, pancultured. CT stone pending.  CT does not reveal kidney stones. Blood work reassuring with normal lactic acid, no leukocytosis. Blood and urine cultures pending. Tachycardia has resolved with hydration. Patient will be started on Keflex, oxycodone given for pain control.  Discussed case with attending physician who agrees with care plan and disposition.   Evaluation does not show pathology that would require ongoing emergent intervention or inpatient treatment. Pt is hemodynamically stable and mentating appropriately. Discussed findings and plan with patient/guardian, who agrees with care plan. All questions answered. Return precautions discussed and outpatient follow up given.   Discharge Medication List as of 04/14/2015  7:07 PM    START taking these medications   Details  cephALEXin (KEFLEX) 500 MG capsule Take 1 capsule (500 mg total) by mouth 3 (three) times daily., Starting 04/14/2015, Until Discontinued, Print    oxyCODONE (ROXICODONE) 5 MG immediate release tablet Take 0.5-1 tablets (2.5-5 mg total) by mouth every 6 (six) hours as needed., Starting 04/14/2015, Until Discontinued, Print             Wynetta Emery, PA-C 04/14/15 2024  Linwood Dibbles, MD 04/15/15 [phone]

## 2015-04-14 NOTE — Discharge Instructions (Signed)
For fever control you can take 650 mg of acetaminophen (Tylenol) this is normally 2 over the counter pills; every 4-6 hours, do not take any other medications that have acetaminophen as an ingredient. Do not drink alcohol.  For fever and pain control, please take Ibuprofen (also known as Motrin or Advil)  (this is normally 2 over the counter pills) every 6 hours. Take with food to minimize stomach irritation.  Take oxycodone for breakthrough pain, do not drink alcohol, drive, care for children or do other critical tasks while taking oxycodone.  Take your antibiotics as directed and to completion. You should never have any leftover antibiotics! Push fluids and stay well hydrated.   Any antibiotic use can reduce the efficacy of hormonal birth control. Please use back up method of contraception.   Please follow with your primary care doctor in the next 2 days for a check-up. They must obtain records for further management.   Do not hesitate to return to the Emergency Department for any new, worsening or concerning symptoms.    Pyelonephritis, Adult Pyelonephritis is a kidney infection. The kidneys are the organs that filter a person's blood and move waste out of the bloodstream and into the urine. Urine passes from the kidneys, through the ureters, and into the bladder. There are two main types of pyelonephritis:  Infections that come on quickly without any warning (acute pyelonephritis).  Infections that last for a long period of time (chronic pyelonephritis). In most cases, the infection clears up with treatment and does not cause further problems. More severe infections or chronic infections can sometimes spread to the bloodstream or lead to other problems with the kidneys. CAUSES This condition is usually caused by:  Bacteria traveling from the bladder to the kidney through infected urine. The urine in the bladder can become infected with bacteria from:  Bladder infection  (cystitis).  Inflammation of the prostate gland (prostatitis).  Sexual intercourse, in females.  Bacteria traveling from the bloodstream to the kidney. RISK FACTORS This condition is more likely to develop in:  Pregnant women.  Older people.  People who have diabetes.  People who have kidney stones or bladder stones.  People who have other abnormalities of the kidney or ureter.  People who have a catheter placed in the bladder.  People who have cancer.  People who are sexually active.  Women who use spermicides.  People who have had a prior urinary tract infection. SYMPTOMS Symptoms of this condition include:  Frequent urination.  Strong or persistent urge to urinate.  Burning or stinging when urinating.  Abdominal pain.  Back pain.  Pain in the side or flank area.  Fever.  Chills.  Blood in the urine, or dark urine.  Nausea.  Vomiting. DIAGNOSIS This condition may be diagnosed based on:  Medical history and physical exam.  Urine tests.  Blood tests. You may also have imaging tests of the kidneys, such as an ultrasound or CT scan. TREATMENT Treatment for this condition may depend on the severity of the infection.  If the infection is mild and is found early, you may be treated with antibiotic medicines taken by mouth. You will need to drink fluids to remain hydrated.  If the infection is more severe, you may need to stay in the hospital and receive antibiotics given directly into a vein through an IV tube. You may also need to receive fluids through an IV tube if you are not able to remain hydrated. After your hospital stay, you  may need to take oral antibiotics for a period of time. Other treatments may be required, depending on the cause of the infection. HOME CARE INSTRUCTIONS Medicines  Take over-the-counter and prescription medicines only as told by your health care provider.  If you were prescribed an antibiotic medicine, take it as told  by your health care provider. Do not stop taking the antibiotic even if you start to feel better. General Instructions  Drink enough fluid to keep your urine clear or pale yellow.  Avoid caffeine, tea, and carbonated beverages. They tend to irritate the bladder.  Urinate often. Avoid holding in urine for long periods of time.  Urinate before and after sex.  After a bowel movement, women should cleanse from front to back. Use each tissue only once.  Keep all follow-up visits as told by your health care provider. This is important. SEEK MEDICAL CARE IF:  Your symptoms do not get better after 2 days of treatment.  Your symptoms get worse.  You have a fever. SEEK IMMEDIATE MEDICAL CARE IF:  You are unable to take your antibiotics or fluids.  You have shaking chills.  You vomit.  You have severe flank or back pain.  You have extreme weakness or fainting.   This information is not intended to replace advice given to you by your health care provider. Make sure you discuss any questions you have with your health care provider.   Document Released: 05/19/2005 Document Revised: 02/07/2015 Document Reviewed: 09/11/2014 Elsevier Interactive Patient Education [employer]2016 [employer] Inc[employer]

## 2015-04-14 NOTE — Progress Notes (Signed)
ANTIBIOTIC CONSULT NOTE - INITIAL  Pharmacy Consult for ceftriaxone Indication: UTI  No Known Allergies  Patient Measurements: Height: 5\' 3"  (160 cm) Weight: 197 lb (89.359 kg) IBW/kg (Calculated) : 52.4 Adjusted Body Weight:   Vital Signs: Temp: 99.1 F (37.3 C) (11/12 1439) Temp Source: Oral (11/12 1439) BP: 141/87 mmHg (11/12 1439) Pulse Rate: 115 (11/12 1439) Intake/Output from previous day:   Intake/Output from this shift:    Labs: No results for input(s): WBC, HGB, PLT, LABCREA, CREATININE in the last 72 hours. CrCl cannot be calculated (Patient has no serum creatinine result on file.). No results for input(s): VANCOTROUGH, VANCOPEAK, VANCORANDOM, GENTTROUGH, GENTPEAK, GENTRANDOM, TOBRATROUGH, TOBRAPEAK, TOBRARND, AMIKACINPEAK, AMIKACINTROU, AMIKACIN in the last 72 hours.   Microbiology: No results found for this or any previous visit (from the past 720 hour(s)).  Medical History: Past Medical History  Diagnosis Date  . Psoriatic arthritis (HCC)   . GERD (gastroesophageal reflux disease)   . Anemia     hx of iron deficiency anemia   . Heart murmur     as infant no problem  . Arthritis     psoriatic arthritis   . Kidney stone     Medications:  See electronic PTA med list  Assessment: 29 y/o immunocompromised female who [facility] with flank pain associated with hematuria, urinary frequency, sensation of incomplete void. She thought she passed a stone. Pharmacy consulted to begin ceftriaxone for UTI. Ceftriaxone 2 g IV already ordered. She is afebrile and blood/urine cultures are pending. UA is dirty.  Goal of Therapy:  Eradication of infection  Plan:  - Ceftriaxone 1 g IV q24h - begin 11/13 at 17:00 - Pharmacy signing off, please re-consult if needed  [facility] Heights Surgery CenterJennifer West[place], Grant ParkPharm.D., BCPS Clinical Pharmacist Pager: [phone] 04/14/2015 4:50 PM

## 2015-04-16 ENCOUNTER — Telehealth (HOSPITAL_BASED_OUTPATIENT_CLINIC_OR_DEPARTMENT_OTHER): Payer: Self-pay | Admitting: Emergency Medicine

## 2015-04-16 NOTE — ED Notes (Signed)
Pt called and requested antibiotic be changed due to no improvement. Upon talking with patient she informed this RN that PA instructed patient that if culture grew something that antibiotic did not cover, they would change the antibiotic. Pt updated that culture is still pending and no new growth. Informed patient that should something show up in next three days, flow manager would call her and updated antibiotic. If no change in culture and patient continues to have no improvement, instructed patient to be re-evaluated. Pt in agreement and no further needs at this time

## 2015-04-17 ENCOUNTER — Emergency Department (HOSPITAL_COMMUNITY): Payer: PRIVATE HEALTH INSURANCE

## 2015-04-17 ENCOUNTER — Emergency Department (HOSPITAL_COMMUNITY)
Admission: EM | Admit: 2015-04-17 | Discharge: 2015-04-17 | Disposition: A | Discharge status: Home / Self Care | Payer: PRIVATE HEALTH INSURANCE | Attending: Emergency Medicine | Admitting: Emergency Medicine

## 2015-04-17 ENCOUNTER — Encounter (HOSPITAL_COMMUNITY): Payer: Self-pay | Admitting: Emergency Medicine

## 2015-04-17 DIAGNOSIS — Z87891 Personal history of nicotine dependence: Secondary | ICD-10-CM | POA: Diagnosis not present

## 2015-04-17 DIAGNOSIS — Z3202 Encounter for pregnancy test, result negative: Secondary | ICD-10-CM | POA: Diagnosis not present

## 2015-04-17 DIAGNOSIS — R011 Cardiac murmur, unspecified: Secondary | ICD-10-CM | POA: Insufficient documentation

## 2015-04-17 DIAGNOSIS — Z8739 Personal history of other diseases of the musculoskeletal system and connective tissue: Secondary | ICD-10-CM | POA: Insufficient documentation

## 2015-04-17 DIAGNOSIS — Z79899 Other long term (current) drug therapy: Secondary | ICD-10-CM | POA: Insufficient documentation

## 2015-04-17 DIAGNOSIS — R1030 Lower abdominal pain, unspecified: Secondary | ICD-10-CM | POA: Diagnosis present

## 2015-04-17 DIAGNOSIS — R509 Fever, unspecified: Secondary | ICD-10-CM | POA: Insufficient documentation

## 2015-04-17 DIAGNOSIS — E669 Obesity, unspecified: Secondary | ICD-10-CM | POA: Insufficient documentation

## 2015-04-17 DIAGNOSIS — Z792 Long term (current) use of antibiotics: Secondary | ICD-10-CM | POA: Diagnosis not present

## 2015-04-17 DIAGNOSIS — K5900 Constipation, unspecified: Secondary | ICD-10-CM | POA: Insufficient documentation

## 2015-04-17 DIAGNOSIS — Z87442 Personal history of urinary calculi: Secondary | ICD-10-CM | POA: Insufficient documentation

## 2015-04-17 DIAGNOSIS — Z872 Personal history of diseases of the skin and subcutaneous tissue: Secondary | ICD-10-CM | POA: Insufficient documentation

## 2015-04-17 DIAGNOSIS — Z862 Personal history of diseases of the blood and blood-forming organs and certain disorders involving the immune mechanism: Secondary | ICD-10-CM | POA: Diagnosis not present

## 2015-04-17 LAB — URINE CULTURE

## 2015-04-17 LAB — URINALYSIS, ROUTINE W REFLEX MICROSCOPIC
Bilirubin Urine: NEGATIVE
Glucose, UA: NEGATIVE mg/dL
HGB URINE DIPSTICK: NEGATIVE
Ketones, ur: NEGATIVE mg/dL
Leukocytes, UA: NEGATIVE
NITRITE: NEGATIVE
PH: 7.5 (ref 5.0–8.0)
Protein, ur: NEGATIVE mg/dL
SPECIFIC GRAVITY, URINE: 1.008 (ref 1.005–1.030)
UROBILINOGEN UA: 0.2 mg/dL (ref 0.0–1.0)

## 2015-04-17 LAB — POC URINE PREG, ED: PREG TEST UR: NEGATIVE

## 2015-04-17 MED ORDER — SODIUM CHLORIDE 0.9 % IV BOLUS (SEPSIS)
1000.0000 mL | Freq: Once | INTRAVENOUS | Status: AC
  Administered 2015-04-17: 1000 mL via INTRAVENOUS

## 2015-04-17 MED ORDER — GLYCERIN (LAXATIVE) 2.1 G RE SUPP
1.0000 | Freq: Once | RECTAL | Status: AC
  Administered 2015-04-17: 1 via RECTAL
  Filled 2015-04-17: qty 1

## 2015-04-17 MED ORDER — OXYCODONE-ACETAMINOPHEN 5-325 MG PO TABS
2.0000 | ORAL_TABLET | Freq: Once | ORAL | Status: AC
  Administered 2015-04-17: 2 via ORAL
  Filled 2015-04-17: qty 2

## 2015-04-17 NOTE — Discharge Instructions (Signed)
Constipation, Adult Follow-up with her primary care physician or use the resource guide below to find one in order to follow-up for your back pain and urinary symptoms. Continue taking MiraLAX to regulate her bowel movements. Constipation is when a person has fewer than three bowel movements a week, has difficulty having a bowel movement, or has stools that are dry, hard, or larger than normal. As people grow older, constipation is more common. A low-fiber diet, not taking in enough fluids, and taking certain medicines may make constipation worse.  CAUSES   Certain medicines, such as antidepressants, pain medicine, iron supplements, antacids, and water pills.   Certain diseases, such as diabetes, irritable bowel syndrome (IBS), thyroid disease, or depression.   Not drinking enough water.   Not eating enough fiber-rich foods.   Stress or travel.   Lack of physical activity or exercise.   Ignoring the urge to have a bowel movement.   Using laxatives too much.  SIGNS AND SYMPTOMS   Having fewer than three bowel movements a week.   Straining to have a bowel movement.   Having stools that are hard, dry, or larger than normal.   Feeling full or bloated.   Pain in the lower abdomen.   Not feeling relief after having a bowel movement.  DIAGNOSIS  Your health care provider will take a medical history and perform a physical exam. Further testing may be done for severe constipation. Some tests may include:  A barium enema X-ray to examine your rectum, colon, and, sometimes, your small intestine.   A sigmoidoscopy to examine your lower colon.   A colonoscopy to examine your entire colon. TREATMENT  Treatment will depend on the severity of your constipation and what is causing it. Some dietary treatments include drinking more fluids and eating more fiber-rich foods. Lifestyle treatments may include regular exercise. If these diet and lifestyle recommendations do not help,  your health care provider may recommend taking over-the-counter laxative medicines to help you have bowel movements. Prescription medicines may be prescribed if over-the-counter medicines do not work.  HOME CARE INSTRUCTIONS   Eat foods that have a lot of fiber, such as fruits, vegetables, whole grains, and beans.  Limit foods high in fat and processed sugars, such as french fries, hamburgers, cookies, candies, and soda.   A fiber supplement may be added to your diet if you cannot get enough fiber from foods.   Drink enough fluids to keep your urine clear or pale yellow.   Exercise regularly or as directed by your health care provider.   Go to the restroom when you have the urge to go. Do not hold it.   Only take over-the-counter or prescription medicines as directed by your health care provider. Do not take other medicines for constipation without talking to your health care provider first.  SEEK IMMEDIATE MEDICAL CARE IF:   You have bright red blood in your stool.   Your constipation lasts for more than 4 days or gets worse.   You have abdominal or rectal pain.   You have thin, pencil-like stools.   You have unexplained weight loss. MAKE SURE YOU:   Understand these instructions.  Will watch your condition.  Will get help right away if you are not doing well or get worse.   This information is not intended to replace advice given to you by your health care provider. Make sure you discuss any questions you have with your health care provider.   Document Released: 02/15/2004  Document Revised: 06/09/2014 Document Reviewed: 02/28/2013 Elsevier Interactive Patient Education [employer]  Emergency Department Resource Guide 1) Find a Doctor and Pay Out of Pocket Although you won't have to find out who is covered by your insurance plan, it is a good idea to ask around and get recommendations. You will then need to call the office and see if the doctor you have  chosen will accept you as a new patient and what types of options they offer for patients who are self-pay. Some doctors offer discounts or will set up payment plans for their patients who do not have insurance, but you will need to ask so you aren't surprised when you get to your appointment.  2) Contact Your Local Health Department Not all health departments have doctors that can see patients for sick visits, but many do, so it is worth a call to see if yours does. If you don't know where your local health department is, you can check in your phone book. The CDC also has a tool to help you locate your state's health department, and many state websites also have listings of all of their local health departments.  3) Find a Walk-in Clinic If your illness is not likely to be very severe or complicated, you may want to try a walk in clinic. These are popping up all over the country in pharmacies, drugstores, and shopping centers. They're usually staffed by nurse practitioners or physician assistants that have been trained to treat common illnesses and complaints. They're usually fairly quick and inexpensive. However, if you have serious medical issues or chronic medical problems, these are probably not your best option.  No Primary Care Doctor: - Call Health Connect at  [phone] - they can help you locate a primary care doctor that  accepts your insurance, provides certain services, etc. - Physician Referral Service- [phone]  Chronic Pain Problems: Organization         Address  Phone   Notes  [facility]  [phone] Patients need to be referred by their primary care doctor.   Medication Assistance: Organization         Address  Phone   Notes  [facility] Medication [facility] [address] [place] [place], [place] [address] [phone] --Must be a resident of [facility] -- Must have NO insurance coverage whatsoever (no Medicaid/ Medicare,  etc.) -- The pt. MUST have a primary care doctor that directs their care regularly and follows them in the community   [facility]ist  [phone]   [employer]  [phone]    Agencies that provide inexpensive medical care: Organization         Address  Phone   Notes  [facility]  [phone]   [facility]    [phone]   [facility] [address] [place], [place] [address] [phone]   [facility] [address] [place], [place] [phone]   Planned Parenthood    [phone]   [facility]    [phone]   Community [facility] and [facility]  [address], [place] Phone:  [phone], Fax:  [phone] Hours of Operation:  9 am - 6 pm, M-F.  Also accepts Medicaid/Medicare and self-pay.  [facility]  [address], [place] Phone: [phone], Fax: [phone]. Hours of Operation:  8:30 am -  5:30 pm, M-F.  Also accepts Medicaid and self-pay.  [facility] [place] [address], [place] Phone: [phone]   [facility] [address], [place], [place] [phone], Ext. [phone] Mondays & Thursdays: 7-9 AM.  First 15 patients are seen on a first come, first serve basis.    Medicaid-accepting [facility] Providers:  Organization         Address  Phone   Notes  [facility] [address], [place] [phone] Also accepts self-pay patients.  [facility] [address], [place]  [phone]   [facility] [address], [place] [phone]   [facility] [address] [place] [address], [place] [phone]) [phone]   Renaye RakersVeita Bland [address], [place]   [phone]) [phone] Only accepts [place] Access [place] patients after they have their name applied to their card.   Self-Pay (no  insurance) in [facility]:  Organization         Address  Phone   Notes  Sickle Cell Patients, [facility] [address], [place] [phone]) [phone]   [facility] [address], [place] [phone]) [phone]   [facility] [place]  [address], [place] [phone]   [facility]/Dr. Osei-Bonsu  [address] [place] [address], [place] or [id number], [place] [phone] Phone number for both [place] and [place] locations is the same.  [facility]gent Medical and [facility] [address], [place] [phone]   [facility] [place] [address] [place] [address], [place] or [address] [phone]   [facility] [address], [place] [phone]) [phone], phone; [phone]) [phone], fax Sees patients 1st and 3rd Saturday of every month.  Must not qualify for public or private insurance (i.e. Medicaid, Medicare, [place] Health Choice, Veterans' Benefits)  Household income should be no more than 200% of the poverty level The clinic cannot treat you if you are pregnant or think you are pregnant  Sexually transmitted diseases are not treated at the clinic.    Dental Care: Organization         Address  Phone  Notes  [facility] of [facility] [address], [place] [phone] Accepts children up to age 31 who are enrolled in [place]icaid or [place] Health Choice; pregnant women with a Medicaid card; and children who have applied for Medicaid or [place] Health Choice, but were declined, whose parents can pay a reduced fee at time of service.  [facility] [place]  [address], [place] [phone] Accepts children up to age 31 who are enrolled in [place] or [place] Health Choice; pregnant women with a Medicaid card; and children who have applied for Medicaid or [place] Health Choice, but were  declined, whose parents can pay a reduced fee at time of service.  [facility]  [address], [place] [phone] Patients are seen by appointment only. Walk-ins are not accepted. [facility] will see patients 31 years of age and older. Monday - Tuesday (8am-5pm) Most Wednesdays (8:30-5pm) $30 per visit, cash only  [facility]  [address], [facility] [phone] Patients are seen by appointment only. Walk-ins are  not accepted. [facility] will see patients 31 years of age and older. One Wednesday Evening (Monthly: Volunteer Based).  $30 per visit, cash only  [employer] [facility]  [phone] for adults; Children under age 204, call Graduate Pediatric Dentistry at [phone]. Children aged 604-14, please call [phone] to request a pediatric application.  Dental services are provided in all areas of dental care including fillings, crowns and bridges, complete and partial dentures, implants, gum treatment, root canals, and extractions. Preventive care is also provided. Treatment is provided to both adults and children. Patients are selected via a lottery and there is often a waiting list.   [facility] [address], [place]  [phone] [email or web address]   [facility] [address], [place], [place] [phone], Ext. [phone] Second and Fourth Thursday of each month, opens at 6:30 AM; Clinic ends at 9 AM.  Patients are seen on a first-come first-served basis, and a limited number are seen during each clinic.   [facility]  [address], [place], [place] [phone]   Eligibility Requirements You must have lived in [place], [place], or [place] counties for at least the last three months.   You cannot be eligible for state or federal sponsored National Cityhealthcare insurance, including CIGNAVeterans Administration, [place]icaid, or [employer]Medicare.   You generally cannot be  eligible for healthcare insurance through your employer.    How to apply: Eligibility screenings are held every Tuesday and Wednesday afternoon from 1:00 pm until 4:00 pm. You do not need an appointment for the interview!  [facility] [address], [place], [place] [phone]   [facility]  [phone]   [facility] [facility]  [phone]   [facility]  [phone]    Behavioral Health Resources in the Community: Intensive Outpatient Programs Organization         Address  Phone  Notes  [facility] [address], [place], [place] [phone]   [facility] [address], [place], [place] [phone]   [facility] [address], [place], [place]  [phone]   [facility] [address],  [place], [place] [phone] or [phone]   Substance Abuse Resources Organization         Address  Phone  Notes  [facility]  [phone]   [facility]  [phone]   [facility]  [phone]   [facility]  [phone]   Residential & Outpatient Substance Abuse Program  [phone]   Psychological Services Organization         Address  Phone  Notes  [facility]  [phone]- [phone]   [facility]  [phone]- [phone]   [facility] [address], [place] [phone] or [phone]    Mobile Crisis Teams Organization         Address  Phone  Notes  [facility], Mobile Crisis Care Unit  [phone]   [facility]  [address] [place], [place] [phone]   Doristine LocksSharon DeEsch [address] [place] [phone]    Self-Help/Support Groups Organization         Address  Phone             Notes  [facility]  - variety of support groups  [phone] Call for more information    Narcotics Anonymous (  NA), [facility] [address], [employer] [place]  2 meetings at this location   Residential Sports administrator         Address  Phone  Notes  [facility] [address],    [place]  [phone]   [facility]  [address] [place] [address], [place], [place] [phone]   [facility] [address] [place], [place] [phone] Admissions: 8am-3pm M-F  [facility] [address],    [place], [place] [phone]   [facility] [address] [place], [place], [place] [phone]   [facility] [address],  [place], [place] [phone]   [facility] - Intensive Outpatient [address], [place], [place] [phone]   [facility] ([facility] [address]  [place], [place] [phone] or [phone]   [facility] ([facility]S) [address] [place], [place] [phone] Accepts Medicaid  [facility] [address]  [place] [phone] Substance Abuse/Addiction Treatment   [facility] Organization         Address  Phone  Notes  [facility]  [phone]   Angie Fava, PhD [address] Ervin Knack [place], [place]   [phone] or [phone]   [facility]   [address] [place], [place] [phone]   [facility] [address] [place], [place] [phone] Insurance/Medicaid/sponsorship through [facility] and Families [address]                                    [place], [place] [phone] Therapy/tele-psych/case  [facility] [address], [place] [phone]    Dr. Lolly Mustache  [phone]   [facility] [place]  [employer] [facility] Dept. 1) [address], [place] 2) [address], [place] 3)  [address], [place] [phone]  [phone]   [facility] Child Abuse Hotline [phone] or [phone] (After Hours)

## 2015-04-17 NOTE — ED Provider Notes (Signed)
CSN: [id number]     Arrival date & time 04/17/15  7829 History   First MD Initiated Contact with Patient 04/17/15 0940     Chief Complaint  Patient presents with  . Abdominal Pain  . Constipation     (Consider location/radiation/quality/duration/timing/severity/associated sxs/prior Treatment) Patient is a 29 y.o. female presenting with abdominal pain and constipation. The history is provided by the patient. No language interpreter was used.  Abdominal Pain Associated symptoms: constipation and fever   Associated symptoms: no chest pain, no chills, no nausea, no shortness of breath and no vomiting   Constipation Associated symptoms: abdominal pain and fever   Associated symptoms: no nausea and no vomiting    Miss Gitlin is a 29 year old female with a history of psoriatic arthritis (on Humira injections every other week), anemia, kidney stone who presents for constipation and has not had a bowel movement 8 days. She is also complaining of lower abdominal pain. She was recently diagnosed with a UTI 3 days ago and was put on Keflex. She reports minimal relief with antibiotics. She states she completed her dose of oxycodone last night the pain returned and she took ibuprofen. She describes her urinary symptoms as frequency and feels that she cannot empty her bladder fully. She also mentioned that she had a fever last night (Tmax 101.7). She denies any chills, shortness of breath, back pain, nausea, vomiting, diarrhea, or vaginal bleeding. Her last menstrual period was 7-8 months ago. She is on birth control.  Past Medical History  Diagnosis Date  . Psoriatic arthritis (HCC)   . GERD (gastroesophageal reflux disease)   . Anemia     hx of iron deficiency anemia   . Heart murmur     as infant no problem  . Arthritis     psoriatic arthritis   . Kidney stone    Past Surgical History  Procedure Laterality Date  . Cholecystectomy N/A 08/19/2013    Procedure: LAPAROSCOPIC CHOLECYSTECTOMY WITH  INTRAOPERATIVE CHOLANGIOGRAM;  Surgeon: Velora Heckler, MD;  Location: [facility];  Service: General;  Laterality: N/A;   Family History  Problem Relation Age of Onset  . Cancer Maternal Aunt     breast   Social History  Substance Use Topics  . Smoking status: Former Smoker    Quit date: 06/02/2006  . Smokeless tobacco: None  . Alcohol Use: Yes     Comment: occ   OB History    No data available     Review of Systems  Constitutional: Positive for fever. Negative for chills.  Respiratory: Negative for shortness of breath.   Cardiovascular: Negative for chest pain.  Gastrointestinal: Positive for abdominal pain and constipation. Negative for nausea and vomiting.  Genitourinary: Positive for flank pain.  All other systems reviewed and are negative.     Allergies  Dilaudid  Home Medications   Prior to Admission medications   Medication Sig Start Date End Date Taking? Authorizing Provider  Adalimumab (HUMIRA PEN [place]) Inject into the skin. Whole pen Every other Thursday last dose 08/11/13    Historical Provider, MD  cephALEXin (KEFLEX) 500 MG capsule Take 1 capsule (500 mg total) by mouth 3 (three) times daily. 04/14/15   Nicole Pisciotta, PA-C  lamoTRIgine (LAMICTAL) 100 MG tablet Take 100 mg by mouth daily.    Historical Provider, MD  oxyCODONE (ROXICODONE) 5 MG immediate release tablet Take 0.5-1 tablets (2.5-5 mg total) by mouth every 6 (six) hours as needed. 04/14/15   Wynetta Emery, PA-C  BP 130/76 mmHg  Pulse 87  Temp(Src) 97.8 F (36.6 C) (Oral)  Resp 18  SpO2 100% Physical Exam  Constitutional: She is oriented to person, place, and time. Vital signs are normal. She appears well-developed and well-nourished.  Crying on exam.  HENT:  Head: Normocephalic and atraumatic.  Eyes: Conjunctivae are normal.  Neck: Normal range of motion. Neck supple.  Cardiovascular: Normal rate, regular rhythm and normal heart sounds.   Pulmonary/Chest: Effort normal and breath sounds  normal. No respiratory distress. She has no wheezes.  Lungs are clear to auscultation bilaterally.  Abdominal: Soft. She exhibits no distension. There is no tenderness. There is CVA tenderness. There is no rebound and no guarding.  Obese. Soft and nondistended. Mild generalized abdominal tenderness. No focal tenderness. No guarding or rebound. Surgical scars noted from cholecystectomy. Bilateral CVA tenderness.  Musculoskeletal: Normal range of motion.  Neurological: She is alert and oriented to person, place, and time.  Skin: Skin is warm and dry.  Psychiatric: She has a normal mood and affect.  Nursing note and vitals reviewed.   ED Course  Procedures (including critical care time) Labs Review Labs Reviewed  URINALYSIS, ROUTINE W REFLEX MICROSCOPIC (NOT [facility])  POC URINE PREG, ED    Imaging Review Dg Abd 1 View  04/17/2015  CLINICAL DATA:  Abdominal pain. EXAM: ABDOMEN - 1 VIEW COMPARISON:  None. FINDINGS: There is a moderate amount of stool throughout the colon. There is no bowel dilatation to suggest obstruction. There is no evidence of pneumoperitoneum, portal venous gas or pneumatosis. There are no pathologic calcifications along the expected course of the ureters. The osseous structures are unremarkable. IMPRESSION: Moderate amount of stool throughout the colon. Electronically Signed   By: Elige KoHetal  Patel   On: 04/17/2015 10:58   I have personally reviewed and evaluated these images and lab results as part of my medical decision-making.   EKG Interpretation   Date/Time:  Tuesday April 17 2015 09:54:07 EST Ventricular Rate:  108 PR Interval:  164 QRS Duration: 88 QT Interval:  353 QTC Calculation: 473 R Axis:   107 Text Interpretation:  Sinus tachycardia Borderline right axis deviation  Confirmed by DELO  MD, DOUGLAS ([id number]) on 04/17/2015 10:14:50 AM      MDM   Final diagnoses:  Constipation, unspecified constipation type  Patient presents for abdominal pain,  sensation of incomplete void, and constipation 8 days. She was diagnosed with a UTI 3 days ago and has had 3 days of Keflex but still has back pain. She is on Humara for psoriatic arthritis and is immunocompromise. She is well-appearing. She is afebrile but tachycardic. Her urine culture shows that she is sensitive to Keflex. She had a CT scan done at that time which did not reveal kidney stones or pyelonephritis. Her blood work which included her kidney function, white count, and lactic acid were all negative. Her blood cultures were negative. I will repeat UA and obtain abdominal plain film to see if there is stool in the abdomen.  UA is negative for leukocytes or nitrites. I believe the Keflex is working. Her back pain may be caused by the stool throughout her colon on abdominal x-ray. She has no signs of bowel obstruction. She was given a glycerin suppository in the ED. I discussed continuing to take MiraLAX daily to regulate her bowels. I discussed follow-up with her PCP and she stated that she does not like her PCP. I offered to give her the resource guide to find a  new provider. I also explained return precautions with the patient and she verbally agrees with the plan. Medications  sodium chloride 0.9 % bolus 1,000 mL (0 mLs Intravenous Stopped 04/17/15 1211)  oxyCODONE-acetaminophen (PERCOCET/ROXICET) 5-325 MG per tablet 2 tablet (2 tablets Oral Given 04/17/15 1011)  Glycerin (Adult) 2.1 G suppository 1 suppository (1 suppository Rectal Given 04/17/15 1221)   Filed Vitals:   04/17/15 1215  BP: 130/76  Pulse: 87  Temp:   Resp: [address] PA-C 04/17/15 1342  Geoffery Lyons, MD 04/17/15 1351

## 2015-04-17 NOTE — ED Notes (Signed)
PA Patel Mills at bedside.  

## 2015-04-17 NOTE — ED Notes (Signed)
Pt reports she was diagnosed with a uti on Wednesday, reports she was given keflex upon discharge but has had no relief. Pt reports abdominal pain and no bowel movement since nov 7th.

## 2015-04-18 ENCOUNTER — Telehealth (HOSPITAL_BASED_OUTPATIENT_CLINIC_OR_DEPARTMENT_OTHER): Payer: Self-pay | Admitting: Emergency Medicine

## 2015-04-18 NOTE — Telephone Encounter (Signed)
Post ED Visit - Positive Culture Follow-up  Culture report reviewed by antimicrobial stewardship pharmacist:  []  Erin Flores, Pharm.D. []  Erin Flores, Pharm.D., BCPS [x]  Erin Flores, Pharm.D. []  Erin Flores, Pharm.D., BCPS []  Erin Flores, [address] BCPS, AAHIVP []  Erin Flores, Pharm.D., BCPS, AAHIVP []  Erin Flores, Pharm.D. []  Erin Flores, [address]  Positive urine culture E. coli Treated with cephalexin, organism sensitive to the same and no further patient follow-up is required at this time.  Erin Flores, Erin Flores 04/18/2015, 10:12 AM

## 2015-04-19 LAB — CULTURE, BLOOD (ROUTINE X 2)
CULTURE: NO GROWTH
CULTURE: NO GROWTH

## 2015-06-03 DIAGNOSIS — B029 Zoster without complications: Secondary | ICD-10-CM

## 2015-06-03 HISTORY — DX: Zoster without complications: B02.9

## 2015-10-13 ENCOUNTER — Emergency Department (HOSPITAL_BASED_OUTPATIENT_CLINIC_OR_DEPARTMENT_OTHER)
Admission: EM | Admit: 2015-10-13 | Discharge: 2015-10-13 | Disposition: A | Discharge status: Home / Self Care | Payer: PRIVATE HEALTH INSURANCE | Attending: Emergency Medicine | Admitting: Emergency Medicine

## 2015-10-13 ENCOUNTER — Encounter (HOSPITAL_BASED_OUTPATIENT_CLINIC_OR_DEPARTMENT_OTHER): Payer: Self-pay | Admitting: *Deleted

## 2015-10-13 DIAGNOSIS — Z79899 Other long term (current) drug therapy: Secondary | ICD-10-CM | POA: Diagnosis not present

## 2015-10-13 DIAGNOSIS — L4050 Arthropathic psoriasis, unspecified: Secondary | ICD-10-CM | POA: Insufficient documentation

## 2015-10-13 DIAGNOSIS — Z87891 Personal history of nicotine dependence: Secondary | ICD-10-CM | POA: Diagnosis not present

## 2015-10-13 DIAGNOSIS — B029 Zoster without complications: Secondary | ICD-10-CM | POA: Diagnosis present

## 2015-10-13 MED ORDER — OXYCODONE-ACETAMINOPHEN 5-325 MG PO TABS: 1.0000 | ORAL_TABLET | ORAL | Status: DC | PRN

## 2015-10-13 NOTE — Discharge Instructions (Signed)

## 2015-10-13 NOTE — ED Notes (Signed)
MD at bedside. 

## 2015-10-13 NOTE — ED Provider Notes (Signed)
CSN: [id number]     Arrival date & time 10/13/15  1216 History   First MD Initiated Contact with Patient 10/13/15 1241     Chief Complaint  Patient presents with  . Herpes Zoster    shingles     (Consider location/radiation/quality/duration/timing/severity/associated sxs/prior Treatment) HPI Comments: Patient presents with pain related to shingles. She states last Tuesday she developed a rash across her left chest area. She was seen in urgent care the next day. She was started on Valtrex and tramadol for pain. She still taking these medications but she states the tramadol is not helping her pain. 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 She comes in today because she's had increased pain and spreading of the shingles. She denies any fevers. No nausea or vomiting.   Past Medical History  Diagnosis Date  . Psoriatic arthritis (HCC)   . GERD (gastroesophageal reflux disease)   . Anemia     hx of iron deficiency anemia   . Heart murmur     as infant no problem  . Arthritis     psoriatic arthritis   . Kidney problem    Past Surgical History  Procedure Laterality Date  . Cholecystectomy N/A 08/19/2013    Procedure: LAPAROSCOPIC CHOLECYSTECTOMY WITH INTRAOPERATIVE CHOLANGIOGRAM;  Surgeon: Velora Hecklerodd M Gerkin, MD;  Location: [facility];  Service: General;  Laterality: N/A;   Family History  Problem Relation Age of Onset  . Cancer Maternal Aunt     breast   Social History  Substance Use Topics  . Smoking status: Former Smoker    Quit date: 06/02/2006  . Smokeless tobacco: Never Used  . Alcohol Use: Yes     Comment: 3x month   OB History    No data available     Review of Systems  Constitutional: Negative for fever, chills, diaphoresis and fatigue.  HENT: Negative for congestion, rhinorrhea and sneezing.   Eyes: Negative.   Respiratory: Negative for cough, chest tightness and shortness of breath.   Cardiovascular:  Negative for chest pain and leg swelling.  Gastrointestinal: Negative for nausea, vomiting, abdominal pain, diarrhea and blood in stool.  Genitourinary: Negative for frequency, hematuria, flank pain and difficulty urinating.  Musculoskeletal: Negative for back pain and arthralgias.  Skin: Positive for rash.  Neurological: Negative for dizziness, speech difficulty, weakness, numbness and headaches.      Allergies  Dilaudid  Home Medications   Prior to Admission medications   Medication Sig Start Date End Date Taking? Authorizing Provider  Adalimumab (HUMIRA PEN [place]) Inject into the skin. Whole pen Every other Thursday last dose 08/11/13   Yes Historical Provider, MD  clonazePAM (KLONOPIN) 2 MG tablet Take 2 mg by mouth 4 (four) times daily.   Yes Historical Provider, MD  lamoTRIgine (LAMICTAL) 100 MG tablet Take 100 mg by mouth daily.   Yes Historical Provider, MD  cephALEXin (KEFLEX) 500 MG capsule Take 1 capsule (500 mg total) by mouth 3 (three) times daily. 04/14/15   Nicole Pisciotta, PA-C  oxyCODONE (ROXICODONE) 5 MG immediate release tablet Take 0.5-1 tablets (2.5-5 mg total) by mouth every 6 (six) hours as needed. 04/14/15   Nicole Pisciotta, PA-C  oxyCODONE-acetaminophen (PERCOCET) 5-325 MG tablet Take 1-2 tablets by mouth every 4 (four) hours as needed. 10/13/15   Rolan BuccoMelanie Olusegun Gerstenberger, MD   BP 131/89 mmHg  Pulse 102  Temp(Src) 98.6 F (37 C) (Oral)  Resp 18  Ht 5\' 4"  (  1.626 m)  Wt 200 lb (90.719 kg)  BMI 34.31 kg/m2  SpO2 97% Physical Exam  Constitutional: She is oriented to person, place, and time. She appears well-developed and well-nourished.  HENT:  Head: Normocephalic and atraumatic.  Eyes: Pupils are equal, round, and reactive to light.  Neck: Normal range of motion. Neck supple.  Cardiovascular: Normal rate, regular rhythm and normal heart sounds.   Pulmonary/Chest: Effort normal and breath sounds normal. No respiratory distress. She has no wheezes. She has no rales. She  exhibits no tenderness.  Abdominal: Soft. Bowel sounds are normal. There is no tenderness. There is no rebound and no guarding.  Musculoskeletal: Normal range of motion. She exhibits no edema.  Lymphadenopathy:    She has no cervical adenopathy.  Neurological: She is alert and oriented to person, place, and time.  Skin: Skin is warm and dry. Rash (Patient has a vesicular erythematous rash to the left chest under the breast from the mid abdomen around to the back. There is no drainage or signs of infection) noted.  Psychiatric: She has a normal mood and affect.    ED Course  Procedures (including critical care time) Labs Review Labs Reviewed - No data to display  Imaging Review No results found. I have personally reviewed and evaluated these images and lab results as part of my medical decision-making.   EKG Interpretation None      MDM   Final diagnoses:  Shingles    Patient presents with shingles pain. There is no signs of infection. She's currently on Valtrez and gabapentin. She states the tramadol is not helping with her pain. She was given a prescription for Percocet and encouraged to follow-up with the PCP for ongoing management.    Rolan Bucco, MD 10/13/15 1455

## 2015-10-13 NOTE — ED Notes (Signed)
Seen at Urgent care on Wednesday and dx with shingles. Reports pain is worse. Taking tramadol, valtrex, and gabapentin

## 2016-06-02 DIAGNOSIS — Z87448 Personal history of other diseases of urinary system: Secondary | ICD-10-CM

## 2016-06-02 HISTORY — DX: Personal history of other diseases of urinary system: Z87.448

## 2016-12-01 IMAGING — DX DG ABDOMEN 1V
1 series · 1 of 1 positions shown · non-contrast
Comparison: None.

CLINICAL DATA: Abdominal pain.

EXAM:
ABDOMEN - 1 VIEW

[abdomen kub]
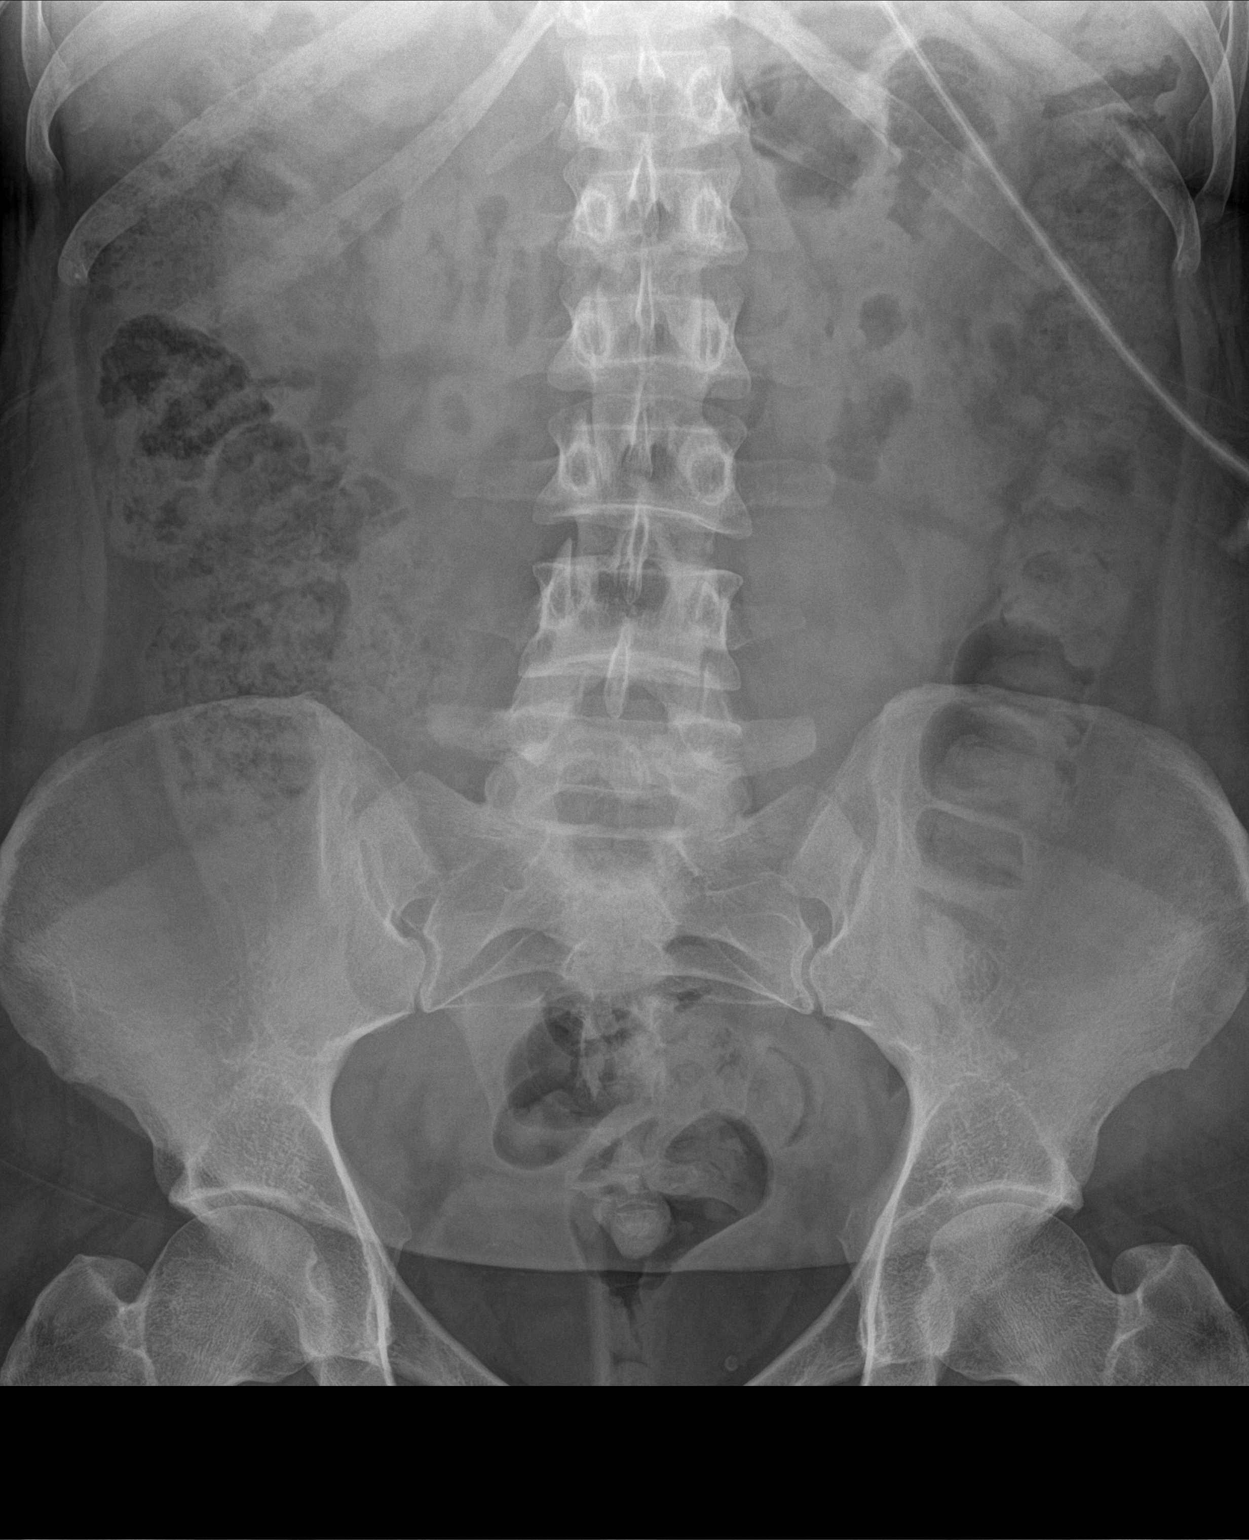

[1 of 1 positions shown; findings below may reference images not displayed]

FINDINGS: There is a moderate amount of stool throughout the colon. There is
no bowel dilatation to suggest obstruction. There is no evidence of
pneumoperitoneum, portal venous gas or pneumatosis. There are no
pathologic calcifications along the expected course of the ureters.
The osseous structures are unremarkable.
IMPRESSION: Moderate amount of stool throughout the colon.

## 2016-12-31 ENCOUNTER — Emergency Department (HOSPITAL_COMMUNITY)
Admission: EM | Admit: 2016-12-31 | Discharge: 2016-12-31 | Disposition: A | Discharge status: Home / Self Care | Payer: PRIVATE HEALTH INSURANCE | Attending: Emergency Medicine | Admitting: Emergency Medicine

## 2016-12-31 ENCOUNTER — Emergency Department (HOSPITAL_COMMUNITY): Payer: PRIVATE HEALTH INSURANCE

## 2016-12-31 ENCOUNTER — Encounter (HOSPITAL_COMMUNITY): Payer: Self-pay | Admitting: *Deleted

## 2016-12-31 DIAGNOSIS — R55 Syncope and collapse: Secondary | ICD-10-CM

## 2016-12-31 DIAGNOSIS — R0602 Shortness of breath: Secondary | ICD-10-CM | POA: Insufficient documentation

## 2016-12-31 DIAGNOSIS — Z885 Allergy status to narcotic agent status: Secondary | ICD-10-CM | POA: Insufficient documentation

## 2016-12-31 DIAGNOSIS — Z79899 Other long term (current) drug therapy: Secondary | ICD-10-CM | POA: Insufficient documentation

## 2016-12-31 DIAGNOSIS — Z87891 Personal history of nicotine dependence: Secondary | ICD-10-CM | POA: Insufficient documentation

## 2016-12-31 LAB — BASIC METABOLIC PANEL
Anion gap: 11 (ref 5–15)
BUN: 5 mg/dL — ABNORMAL LOW (ref 6–20)
CALCIUM: 9.2 mg/dL (ref 8.9–10.3)
CHLORIDE: 105 mmol/L (ref 101–111)
CO2: 24 mmol/L (ref 22–32)
CREATININE: 0.76 mg/dL (ref 0.44–1.00)
Glucose, Bld: 87 mg/dL (ref 65–99)
Potassium: 3.3 mmol/L — ABNORMAL LOW (ref 3.5–5.1)
SODIUM: 140 mmol/L (ref 135–145)

## 2016-12-31 LAB — URINALYSIS, ROUTINE W REFLEX MICROSCOPIC
Bilirubin Urine: NEGATIVE
GLUCOSE, UA: NEGATIVE mg/dL
KETONES UR: NEGATIVE mg/dL
Nitrite: NEGATIVE
PH: 6 (ref 5.0–8.0)
Protein, ur: NEGATIVE mg/dL
SPECIFIC GRAVITY, URINE: 1.009 (ref 1.005–1.030)

## 2016-12-31 LAB — CBG MONITORING, ED: GLUCOSE-CAPILLARY: 85 mg/dL (ref 65–99)

## 2016-12-31 LAB — I-STAT BETA HCG BLOOD, ED (MC, WL, AP ONLY)

## 2016-12-31 LAB — CBC
HCT: 39.2 % (ref 36.0–46.0)
Hemoglobin: 13.3 g/dL (ref 12.0–15.0)
MCH: 27.7 pg (ref 26.0–34.0)
MCHC: 33.9 g/dL (ref 30.0–36.0)
MCV: 81.7 fL (ref 78.0–100.0)
PLATELETS: 269 10*3/uL (ref 150–400)
RBC: 4.8 MIL/uL (ref 3.87–5.11)
RDW: 13.2 % (ref 11.5–15.5)
WBC: 9.3 10*3/uL (ref 4.0–10.5)

## 2016-12-31 LAB — I-STAT TROPONIN, ED: Troponin i, poc: 0 ng/mL (ref 0.00–0.08)

## 2016-12-31 MED ORDER — POTASSIUM CHLORIDE CRYS ER 20 MEQ PO TBCR
40.0000 meq | EXTENDED_RELEASE_TABLET | Freq: Once | ORAL | Status: AC
  Administered 2016-12-31: 40 meq via ORAL
  Filled 2016-12-31: qty 2

## 2016-12-31 MED ORDER — SODIUM CHLORIDE 0.9 % IV BOLUS (SEPSIS)
1000.0000 mL | Freq: Once | INTRAVENOUS | Status: AC
  Administered 2016-12-31: 1000 mL via INTRAVENOUS

## 2016-12-31 NOTE — ED Provider Notes (Signed)
[facility] DE[facility] Provider Note   CSN: [id number] Arrival date & time: 12/31/16  1121     History   Chief Complaint Chief Complaint  Patient presents with  . Near Syncope  . Shortness of Breath    HPI Erin Flores is a 31 y.o. female.  Patient with history of psoriatic arthritis on Humira, anemia -- presents with complaint of near syncope while at the dentist office this morning. Patient had just finished a procedure involving local anesthesia when she was standing talking to an Film/video editor. She states that she began to see black spots in her vision and felt like she was going to pass out. She was assisted to a sitting position. She sat for a few minutes and then became lightheaded again when she tried to stand up. Eventually things improved and she was able to walk around however symptoms returned when she went to her car. At that time she developed a pressure in her left chest and a sensation of shortness of breath. She was assisted again and an ambulance was called. She was given nitroglycerin and full dose aspirin. She was transported to the hospital. She continues to have some left chest pressure and shortness of breath. Heart rate is normal and pulse ox is normal. Patient denies risk factors for pulmonary embolism including: unilateral leg swelling, history of DVT/PE/other blood clots, use of exogenous hormones (discontinued OCPs 1.5 months ago), recent immobilizations, recent surgery, recent travel (>4hr segment), malignancy, hemoptysis. No family history of arrhythmia or heart problems, especially at a young age.       Past Medical History:  Diagnosis Date  . Anemia    hx of iron deficiency anemia   . Arthritis    psoriatic arthritis   . GERD (gastroesophageal reflux disease)   . Heart murmur    as infant no problem  . Kidney problem   . Psoriatic arthritis [facility])     Patient Active Problem List   Diagnosis Date Noted  . Abdominal pain 08/19/2013  . 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 Abdominal pain 08/16/2013    Past Surgical History:  Procedure Laterality Date  . CHOLECYSTECTOMY N/A 08/19/2013   Procedure: LAPAROSCOPIC CHOLECYSTECTOMY WITH INTRAOPERATIVE CHOLANGIOGRAM;  Surgeon: Velora Heckler, MD;  Location: [facility];  Service: General;  Laterality: N/A;    OB History    No data available       Home Medications    Prior to Admission medications   Medication Sig Start Date End Date Taking? Authorizing Provider  Adalimumab (HUMIRA PEN [place]) Inject into the skin. Whole pen Every other Thursday last dose 08/11/13    [provider]  cephALEXin (KEFLEX) 500 MG capsule Take 1 capsule (500 mg total) by mouth 3 (three) times daily. 04/14/15   Pisciotta, Joni Reining, PA-C  clonazePAM (KLONOPIN) 2 MG tablet Take 2 mg by mouth 4 (four) times daily.    [provider]  lamoTRIgine (LAMICTAL) 100 MG tablet Take 100 mg by mouth daily.    [provider]  oxyCODONE (ROXICODONE) 5 MG immediate release tablet Take 0.5-1 tablets (2.5-5 mg total) by mouth every 6 (six) hours as needed. 04/14/15   Pisciotta, Joni Reining, PA-C  oxyCODONE-acetaminophen (PERCOCET) 5-325 MG tablet Take 1-2 tablets by mouth every 4 (four) hours as needed. 10/13/15   Rolan Bucco, MD    Family History Family History  Problem Relation Age of Onset  . Cancer Maternal Aunt        breast    Social  History Social History  Substance Use Topics  . Smoking status: Former Smoker    Quit date: 06/02/2006  . Smokeless tobacco: Never Used  . Alcohol use Yes     Comment: 3x month     Allergies   Dilaudid [hydromorphone hcl]   Review of Systems Review of Systems  Constitutional: Negative for diaphoresis and fever.  Eyes: Negative for redness.  Respiratory: Positive for shortness of breath. Negative for cough.   Cardiovascular: Positive for chest pain. Negative for palpitations and leg swelling.  Gastrointestinal: Negative for abdominal pain, nausea and  vomiting.  Genitourinary: Negative for dysuria.  Musculoskeletal: Negative for back pain and neck pain.  Skin: Negative for rash.  Neurological: Positive for light-headedness. Negative for syncope.  Psychiatric/Behavioral: The patient is not nervous/anxious.      Physical Exam Updated Vital Signs BP (!) 154/95   Pulse 83   Temp (!) 97.4 F (36.3 C) (Oral)   Resp 12   Ht 5\' 3"  (1.6 m)   Wt 93.9 kg (207 lb)   SpO2 99%   BMI 36.67 kg/m   Physical Exam  Constitutional: She appears well-developed and well-nourished.  HENT:  Head: Normocephalic and atraumatic.  Mouth/Throat: Oropharynx is clear and moist.  Eyes: Conjunctivae are normal. Right eye exhibits no discharge. Left eye exhibits no discharge.  Neck: Normal range of motion. Neck supple.  Cardiovascular: Normal rate, regular rhythm and normal heart sounds.   No murmur heard. Pulmonary/Chest: Effort normal and breath sounds normal. No respiratory distress. She has no wheezes. She has no rales.  Abdominal: Soft. There is no tenderness.  Musculoskeletal: She exhibits no edema or tenderness.  Neurological: She is alert.  Skin: Skin is warm and dry.  Psychiatric: She has a normal mood and affect.  Nursing note and vitals reviewed.    ED Treatments / Results  Labs (all labs ordered are listed, but only abnormal results are displayed) Labs Reviewed  BASIC METABOLIC PANEL - Abnormal; Notable for the following:       Result Value   Potassium 3.3 (*)    BUN 5 (*)    All other components within normal limits  CBC  URINALYSIS, ROUTINE W REFLEX MICROSCOPIC  CBG MONITORING, ED  CBG MONITORING, ED  I-STAT TROPONIN, ED  I-STAT BETA HCG BLOOD, ED ([facility], [facility], [facility] ONLY)    EKG  EKG Interpretation  Date/Time:  Wednesday December 31 2016 11:21:57 EDT Ventricular Rate:  89 PR Interval:    QRS Duration: 87 QT Interval:  482 QTC Calculation: 587 R Axis:   140 Text Interpretation:  Sinus rhythm Right axis deviation Nonspecific T  abnrm, anterolateral leads Prolonged QT interval Confirmed by Cathren LaineSteinl, Kevin ([id number]) on 12/31/2016 11:25:19 AM       Radiology Dg Chest 2 View  Result Date: 12/31/2016 CLINICAL DATA:  Near syncope x2 today.  Left chest pain. EXAM: CHEST  2 VIEW COMPARISON:  PA and lateral chest 04/14/2015. FINDINGS: The lungs are clear. Heart size is normal. No pneumothorax or pleural fluid. No bony abnormality. IMPRESSION: Negative chest. Electronically Signed   By: Drusilla Kannerhomas  Dalessio M.D.   On: 12/31/2016 13:07    Procedures Procedures (including critical care time)  Medications Ordered in ED Medications  potassium chloride SA (K-DUR,KLOR-CON) CR tablet 40 mEq (40 mEq Oral Given 12/31/16 1350)  sodium chloride 0.9 % bolus 1,000 mL (1,000 mLs Intravenous New Bag/Given 12/31/16 1351)     Initial Impression / Assessment and Plan / ED Course  I have reviewed  the triage vital signs and the nursing notes.  Pertinent labs & imaging results that were available during my care of the patient were reviewed by me and considered in my medical decision making (see chart for details).     Patient seen and examined. EKG shows prolonged QT. This is different than previous EKG from 2 years ago.    Vital signs reviewed and are as follows: BP (!) 154/95   Pulse 83   Temp (!) 97.4 F (36.3 C) (Oral)   Resp 12   Ht 5\' 3"  (1.6 m)   Wt 93.9 kg (207 lb)   SpO2 99%   BMI 36.67 kg/m   Workup reassuring. Patient has been receiving IV fluids. She has had some lightheadedness with standing.  Repeat EKG evaluated with Dr. Denton LankSteinl. Dr. Denton LankSteinl has seen patient as well. It reads as prolonged QT however measurement actually performed QT appears normal, closer to 420. We believe this is a measurement error by the machine based on both EKG.   Patient will be discharged to home. Encouraged return if she develops chest pain or shortness of breath, repeat episodes of syncope.  Encouraged PCP follow-up this week for  recheck.   Final Clinical Impressions(s) / ED Diagnoses   Final diagnoses:  Near syncope   Patient with near syncopal episodes after dental procedure today. EKG discussed as above. Patient is not pregnant. She was slightly hypokalemic and orthostatic. She was treated with fluids and oral potassium. No concerning history factors for PE. Doubt ACS, troponin negative 1. No concerning family history or signs of cardiomyopathy or cardiomegaly. Blood pressure is elevated, and patient will have this rechecked she is encouraged to rest and hydrate well at home, follow-up with PCP as needed..  New Prescriptions New Prescriptions   No medications on file     Renne CriglerGeiple, Pearlie Nies, Cordelia Poche-C 12/31/16 1608    Cathren LaineSteinl, Kevin, MD 01/01/17 1208

## 2016-12-31 NOTE — ED Triage Notes (Addendum)
Pt here via [facility] after 2 near syncopal episodes immediately after having R upper molar dental infection cleaned at dentist's office.  Pt was able to ambulate, but then [facility] was call b/c pt continued to "see black spots" and began experiencing pressure in her L chest and feels as if she cannot take a full breath.  States [place] and states it normally takes her a long time to heal.  Given 1 nitro and 324 asa en-route.  Pt became nauseated while on ambulance.

## 2016-12-31 NOTE — Discharge Instructions (Signed)
Please read and follow all provided instructions.  Your diagnoses today include:  1. Near syncope     Tests performed today include:  An EKG of your heart  A chest x-ray  Cardiac enzymes - a blood test for heart muscle damage  Blood counts and electrolytes  Vital signs. See below for your results today.   Medications prescribed:   None  Take any prescribed medications only as directed.  Follow-up instructions: Please follow-up with your primary care provider in 3 days for further evaluation of your symptoms.    Your vital signs today were: BP (!) 154/95    Pulse 83    Temp (!) 97.4 F (36.3 C) (Oral)    Resp 12    Ht 5\' 3"  (1.6 m)    Wt 93.9 kg (207 lb)    SpO2 99%    BMI 36.67 kg/m  If your blood pressure (BP) was elevated above 135/85 this visit, please have this repeated by your doctor within one month. --------------

## 2017-11-04 ENCOUNTER — Encounter: Payer: Self-pay | Admitting: *Deleted

## 2017-11-05 ENCOUNTER — Encounter: Payer: Self-pay | Admitting: Nurse Practitioner

## 2017-11-05 ENCOUNTER — Ambulatory Visit (INDEPENDENT_AMBULATORY_CARE_PROVIDER_SITE_OTHER): Payer: PRIVATE HEALTH INSURANCE | Admitting: Nurse Practitioner

## 2017-11-05 VITALS — BP 127/87 | HR 105 | Ht 63.0 in | Wt 169.1 lb

## 2017-11-05 DIAGNOSIS — Z308 Encounter for other contraceptive management: Secondary | ICD-10-CM

## 2017-11-05 DIAGNOSIS — F3110 Bipolar disorder, current episode manic without psychotic features, unspecified: Secondary | ICD-10-CM | POA: Insufficient documentation

## 2017-11-05 DIAGNOSIS — Z3046 Encounter for surveillance of implantable subdermal contraceptive: Secondary | ICD-10-CM | POA: Diagnosis not present

## 2017-11-05 MED ORDER — ETONOGESTREL 68 MG ~~LOC~~ IMPL
68.0000 mg | DRUG_IMPLANT | Freq: Once | SUBCUTANEOUS | Status: AC
  Administered 2017-11-05: 68 mg via SUBCUTANEOUS

## 2017-11-05 NOTE — Progress Notes (Signed)
     GYNECOLOGY OFFICE PROCEDURE NOTE  Erin Flores is a 32 y.o. No obstetric history on file. Referred here from her GYN MD for Nexplanon removal and  Nexplanon reinsertion.  Last pap smear was in 2019 in her GYN office and she reports it was normal.  No other gynecologic concerns.   Nexplanon Removal and Insertion  Patient identified, informed consent performed, consent signed.   Patient does understand that irregular bleeding is a very common side effect of this medication. She was advised to have backup contraception for two weeks after replacement of the implant. Appropriate time out taken. Implant site identified. Area prepped in usual sterile fashon. One ml of 1% lidocaine was used to anesthetize the area at the distal end of the implant. A small stab incision was made right beside the implant on the distal portion. The Nexplanon rod was removed manually without difficulty. There was minimal blood loss. There were no complications. Area in newly recommended placement guidelines was then injected with 2 ml of 1 % lidocaine. Nexplanon removed from packaging, Device confirmed in needle, then inserted full length of needle and withdrawn per handbook instructions. Nexplanon was able to palpated in the patient's arm; patient palpated the insert herself.  There was minimal blood loss. Patient insertion site covered with gauze and a pressure bandage to reduce any bruising. The patient tolerated the procedure well and was given post procedure instructions.  She was advised to have backup contraception for two  weeks.  Will return to her provider's office for routine care.  Can call the office if there is any problem with the insertion site.  Client was very appreciative of the care today.  Nolene BernheimERRI Vijay Durflinger, RN, MSN, NP-BC Nurse Practitioner, [facility], [facility] 11/05/2017 2:49 PM

## 2018-04-08 ENCOUNTER — Other Ambulatory Visit: Payer: Self-pay | Admitting: Neurosurgery

## 2018-04-08 ENCOUNTER — Ambulatory Visit
Admission: RE | Admit: 2018-04-08 | Discharge: 2018-04-08 | Disposition: A | Discharge status: Home / Self Care | Payer: PRIVATE HEALTH INSURANCE | Source: Ambulatory Visit | Attending: Neurosurgery | Admitting: Neurosurgery

## 2018-04-08 DIAGNOSIS — R42 Dizziness and giddiness: Secondary | ICD-10-CM

## 2018-08-06 ENCOUNTER — Ambulatory Visit: Payer: PRIVATE HEALTH INSURANCE | Admitting: Family Medicine

## 2018-08-06 ENCOUNTER — Encounter: Payer: Self-pay | Admitting: Family Medicine

## 2018-08-06 VITALS — BP 125/85 | HR 74 | Temp 98.3°F | Resp 16 | Ht 63.0 in | Wt 190.0 lb

## 2018-08-06 DIAGNOSIS — K2100 Gastro-esophageal reflux disease with esophagitis, without bleeding: Secondary | ICD-10-CM

## 2018-08-06 DIAGNOSIS — E669 Obesity, unspecified: Secondary | ICD-10-CM

## 2018-08-06 LAB — HEPATIC FUNCTION PANEL
ALK PHOS: 74 (ref 25–125)
ALT: 81 — AB (ref 7–35)
AST: 25 (ref 13–35)
Bilirubin, Total: 0.4

## 2018-08-06 LAB — BASIC METABOLIC PANEL
BUN: 8 (ref 4–21)
CREATININE: 0.8 (ref 0.5–1.1)
Glucose: 78
POTASSIUM: 4.4 (ref 3.4–5.3)
Sodium: 143 (ref 137–147)

## 2018-08-06 LAB — CBC AND DIFFERENTIAL
HCT: 43 (ref 36–46)
Hemoglobin: 14.1 (ref 12.0–16.0)
Neutrophils Absolute: 4
WBC: 8.4

## 2018-08-06 MED ORDER — PANTOPRAZOLE SODIUM 40 MG PO TBEC: 40.0000 mg | DELAYED_RELEASE_TABLET | Freq: Every day | ORAL | 3 refills | Status: DC

## 2018-08-06 NOTE — Patient Instructions (Signed)
Take 40 mg of over the counter famotidine every night for the next 2 weeks.   Gastroesophageal Reflux Disease, Adult Gastroesophageal reflux (GER) happens when acid from the stomach flows up into the tube that connects the mouth and the stomach (esophagus). Normally, food travels down the esophagus and stays in the stomach to be digested. However, when a person has GER, food and stomach acid sometimes move back up into the esophagus. If this becomes a more serious problem, the person may be diagnosed with a disease called gastroesophageal reflux disease (GERD). GERD occurs when the reflux:  Happens often.  Causes frequent or severe symptoms.  Causes problems such as damage to the esophagus. When stomach acid comes in contact with the esophagus, the acid may cause soreness (inflammation) in the esophagus. Over time, GERD may create small holes (ulcers) in the lining of the esophagus. What are the causes? This condition is caused by a problem with the muscle between the esophagus and the stomach (lower esophageal sphincter, or LES). Normally, the LES muscle closes after food passes through the esophagus to the stomach. When the LES is weakened or abnormal, it does not close properly, and that allows food and stomach acid to go back up into the esophagus. The LES can be weakened by certain dietary substances, medicines, and medical conditions, including:  Tobacco use.  Pregnancy.  Having a hiatal hernia.  Alcohol use.  Certain foods and beverages, such as coffee, chocolate, onions, and peppermint. What increases the risk? You are more likely to develop this condition if you:  Have an increased body weight.  Have a connective tissue disorder.  Use NSAID medicines. What are the signs or symptoms? Symptoms of this condition include:  Heartburn.  Difficult or painful swallowing.  The feeling of having a lump in the throat.  Abitter taste in the mouth.  Bad breath.  Having a  large amount of saliva.  Having an upset or bloated stomach.  Belching.  Chest pain. Different conditions can cause chest pain. Make sure you see your health care provider if you experience chest pain.  Shortness of breath or wheezing.  Ongoing (chronic) cough or a night-time cough.  Wearing away of tooth enamel.  Weight loss. How is this diagnosed? Your health care provider will take a medical history and perform a physical exam. To determine if you have mild or severe GERD, your health care provider may also monitor how you respond to treatment. You may also have tests, including:  A test to examine your stomach and esophagus with a small camera (endoscopy).  A test thatmeasures the acidity level in your esophagus.  A test thatmeasures how much pressure is on your esophagus.  A barium swallow or modified barium swallow test to show the shape, size, and functioning of your esophagus. How is this treated? The goal of treatment is to help relieve your symptoms and to prevent complications. Treatment for this condition may vary depending on how severe your symptoms are. Your health care provider may recommend:  Changes to your diet.  Medicine.  Surgery. Follow these instructions at home: Eating and drinking   Follow a diet as recommended by your health care provider. This may involve avoiding foods and drinks such as: ? Coffee and tea (with or without caffeine). ? Drinks that containalcohol. ? Energy drinks and sports drinks. ? Carbonated drinks or sodas. ? Chocolate and cocoa. ? Peppermint and mint flavorings. ? Garlic and onions. ? Horseradish. ? Spicy and acidic foods, including  peppers, chili powder, curry powder, vinegar, hot sauces, and barbecue sauce. ? Citrus fruit juices and citrus fruits, such as oranges, lemons, and limes. ? Tomato-based foods, such as red sauce, chili, salsa, and pizza with red sauce. ? Fried and fatty foods, such as donuts, french fries,  potato chips, and high-fat dressings. ? High-fat meats, such as hot dogs and fatty cuts of red and white meats, such as rib eye steak, sausage, ham, and bacon. ? High-fat dairy items, such as whole milk, butter, and cream cheese.  Eat small, frequent meals instead of large meals.  Avoid drinking large amounts of liquid with your meals.  Avoid eating meals during the 2-3 hours before bedtime.  Avoid lying down right after you eat.  Do not exercise right after you eat. Lifestyle   Do not use any products that contain nicotine or tobacco, such as cigarettes, e-cigarettes, and chewing tobacco. If you need help quitting, ask your health care provider.  Try to reduce your stress by using methods such as yoga or meditation. If you need help reducing stress, ask your health care provider.  If you are overweight, reduce your weight to an amount that is healthy for you. Ask your health care provider for guidance about a safe weight loss goal. General instructions  Pay attention to any changes in your symptoms.  Take over-the-counter and prescription medicines only as told by your health care provider. Do not take aspirin, ibuprofen, or other NSAIDs unless your health care provider told you to do so.  Wear loose-fitting clothing. Do not wear anything tight around your waist that causes pressure on your abdomen.  Raise (elevate) the head of your bed about 6 inches (15 cm).  Avoid bending over if this makes your symptoms worse.  Keep all follow-up visits as told by your health care provider. This is important. Contact a health care provider if:  You have: ? New symptoms. ? Unexplained weight loss. ? Difficulty swallowing or it hurts to swallow. ? Wheezing or a persistent cough. ? A hoarse voice.  Your symptoms do not improve with treatment. Get help right away if you:  Have pain in your arms, neck, jaw, teeth, or back.  Feel sweaty, dizzy, or light-headed.  Have chest pain or  shortness of breath.  Vomit and your vomit looks like blood or coffee grounds.  Faint.  Have stool that is bloody or black.  Cannot swallow, drink, or eat. Summary  Gastroesophageal reflux happens when acid from the stomach flows up into the esophagus. GERD is a disease in which the reflux happens often, causes frequent or severe symptoms, or causes problems such as damage to the esophagus.  Treatment for this condition may vary depending on how severe your symptoms are. Your health care provider may recommend diet and lifestyle changes, medicine, or surgery.  Contact a health care provider if you have new or worsening symptoms.  Take over-the-counter and prescription medicines only as told by your health care provider. Do not take aspirin, ibuprofen, or other NSAIDs unless your health care provider told you to do so.  Keep all follow-up visits as told by your health care provider. This is important. This information is not intended to replace advice given to you by your health care provider. Make sure you discuss any questions you have with your health care provider. Document Released: 02/26/2005 Document Revised: 11/25/2017 Document Reviewed: 11/25/2017 Elsevier Interactive Patient Education  2019 [employer].

## 2018-08-06 NOTE — Progress Notes (Signed)
Office Note 08/06/2018  CC:  Chief Complaint  Patient presents with  . Establish Care    Previous PCP: Deboraha Sprang at [facility]  . Heartburn    HPI:  Erin Flores is a 33 y.o. White female who is here to establish care Patient's most recent primary MD: see above Old records in EPIC/[facility] EMR were reviewed prior to or during today's visit.   Has severe GER lately, mom gave her omeprazole 40 a couple days ago and it helps. Pt was using otc strength nexium and it didn't help.  Has tried famotidine  and it didn't help. Describes substernal burning up into throat, makes her nauseated d/t such bad burning. Most days she feels it after every meal.  Has gotten more severe over the last several months. She avoids spicy foods.  Has decreased volume of food she eats at meals. Has never had endoscopy.  No dysphagia or odynophagia.  Hx of obesity, lost signif wt on phentermine bid in the past. Decrease in energy in morning and night.   Has insomnia.  Past Medical History:  Diagnosis Date  . Bipolar affective disorder (HCC)   . Depression   . GERD (gastroesophageal reflux disease)   . Herpes zoster 2017  . History of iron deficiency anemia   . Kidney problem   . Migraines   . Psoriatic arthritis [facility])     Past Surgical History:  Procedure Laterality Date  . 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 Procedure: LAPAROSCOPIC CHOLECYSTECTOMY WITH INTRAOPERATIVE CHOLANGIOGRAM;  Surgeon: Velora Heckler, MD;  Location: [facility];  Service: General;  Laterality: N/A;    Family History  Problem Relation Age of Onset  . Alcohol abuse Mother   . Cervical cancer Mother   . Depression Mother   . Drug abuse Mother   . Hyperlipidemia Mother   . Hypertension Mother   . Learning disabilities Mother   . Bipolar disorder Mother   . Alcohol abuse Father   . Drug abuse Father   . Hyperlipidemia Father   . Hypertension Father   . Cancer Maternal Aunt        breast  . Alcohol  abuse Sister   . Depression Sister   . Drug abuse Sister   . Bipolar disorder Sister   . Depression Daughter   . Learning disabilities Daughter   . Alcohol abuse Maternal Grandmother   . Drug abuse Maternal Grandmother   . Early death Maternal Grandmother        MVA - drunk driving  . Alcohol abuse Maternal Grandfather   . Drug abuse Maternal Grandfather   . Early death Maternal Grandfather   . Alcohol abuse Paternal Grandfather   . Drug abuse Paternal Grandfather   . Early death Paternal Grandfather     Social History   Socioeconomic History  . Marital status: Married    Spouse name: Not on file  . Number of children: Not on file  . Years of education: Not on file  . Highest education level: Not on file  Occupational History  . Not on file  Social Needs  . Financial resource strain: Not on file  . Food insecurity:    Worry: Not on file    Inability: Not on file  . Transportation needs:    Medical: Not on file    Non-medical: Not on file  Tobacco Use  . Smoking status: Former Smoker    Packs/day: 0.25    Years: 8.00    Pack  years: 2.00    Types: Cigarettes    Last attempt to quit: 06/02/2006    Years since quitting: 12.1  . Smokeless tobacco: Never Used  Substance and Sexual Activity  . Alcohol use: Yes    Comment: occasionally  . Drug use: No  . Sexual activity: Yes    Birth control/protection: Implant  Lifestyle  . Physical activity:    Days per week: Not on file    Minutes per session: Not on file  . Stress: Not on file  Relationships  . Social connections:    Talks on phone: Not on file    Gets together: Not on file    Attends religious service: Not on file    Active member of club or organization: Not on file    Attends meetings of clubs or organizations: Not on file    Relationship status: Not on file  . Intimate partner violence:    Fear of current or ex partner: Not on file    Emotionally abused: Not on file    Physically abused: Not on file     Forced sexual activity: Not on file  Other Topics Concern  . Not on file  Social History Narrative  . Not on file    Outpatient Encounter Medications as of 08/06/2018  Medication Sig  . clonazePAM (KLONOPIN) 2 MG tablet Take 2 mg by mouth 4 (four) times daily as needed for anxiety.   Elgie Collard SURECLICK 50 MG/ML injection 1 injection weekly  . ibuprofen (ADVIL,MOTRIN) 200 MG tablet Take 200 mg by mouth every 6 (six) hours as needed for fever.   . [DISCONTINUED] carbamazepine (TEGRETOL XR) 200 MG 12 hr tablet Take 400 mg by mouth at bedtime.  . [DISCONTINUED] phentermine 37.5 MG capsule Take 37.5 mg by mouth 2 (two) times daily.  . [DISCONTINUED] TALTZ 80 MG/ML SOAJ    No facility-administered encounter medications on file as of 08/06/2018.     Allergies  Allergen Reactions  . Dilaudid [Hydromorphone Hcl] Other (See Comments)    Issues with urination    ROS Review of Systems  Constitutional: Negative for fatigue and fever.  HENT: Negative for congestion and sore throat.   Eyes: Negative for visual disturbance.  Respiratory: Negative for cough.   Cardiovascular: Negative for chest pain.  Gastrointestinal: Positive for nausea (with GERD). Negative for abdominal pain and blood in stool.  Genitourinary: Negative for dysuria.  Musculoskeletal: Negative for back pain and joint swelling.  Skin: Negative for rash.  Neurological: Positive for speech difficulty. Negative for weakness and headaches.  Hematological: Negative for adenopathy.    PE; Blood pressure 125/85, pulse 74, temperature 98.3 F (36.8 C), temperature source Oral, resp. rate 16, height 5\' 3"  (1.6 m), weight 190 lb (86.2 kg), SpO2 99 %. Body mass index is 33.66 kg/m.  Gen: Alert, well appearing.  Patient is oriented to person, place, time, and situation. AFFECT: pleasant, lucid thought and speech. XNA:TFTD: no injection, icteris, swelling, or exudate.  EOMI, PERRLA. Mouth: lips without lesion/swelling.  Oral mucosa  pink and moist. Oropharynx without erythema, exudate, or swelling.  Neck - No masses or thyromegaly or limitation in range of motion CV: RRR, no m/r/g.   LUNGS: CTA bilat, nonlabored resps, good aeration in all lung fields. ABD: soft, NT, ND EXT: no clubbing or cyanosis.  no edema.   Pertinent labs:  No results found for: TSH Lab Results  Component Value Date   WBC 9.3 12/31/2016   HGB 13.3 12/31/2016  HCT 39.2 12/31/2016   MCV 81.7 12/31/2016   PLT 269 12/31/2016   Lab Results  Component Value Date   CREATININE 0.76 12/31/2016   BUN 5 (L) 12/31/2016   NA 140 12/31/2016   K 3.3 (L) 12/31/2016   CL 105 12/31/2016   CO2 24 12/31/2016   Lab Results  Component Value Date   ALT 55 (H) 04/14/2015   AST 25 04/14/2015   ALKPHOS 66 04/14/2015   BILITOT 0.8 04/14/2015    ASSESSMENT AND PLAN:   New pt:  1) GERD: start pantoprazole 40mg  qd. Consider 20-40 mg otc generic pepcid hs for 2 wks as well.  2) Obesity: [facility] (Dr. Bernestine Amass) ordered today. General advise on diet/exercise given today. I do rx wt loss meds-->I explained this to her and she expressed understanding.  She will make return appt to discuss a problem we didn't get to cover today: insomnia.  An After Visit Summary was printed and given to the patient.  F/u: PRN TO DISCUSS INSOMNIA.  Signed:  Santiago Bumpers, MD           08/06/2018

## 2018-08-09 ENCOUNTER — Encounter: Payer: Self-pay | Admitting: Family Medicine

## 2018-08-18 ENCOUNTER — Encounter: Payer: Self-pay | Admitting: Family Medicine

## 2019-04-15 ENCOUNTER — Encounter (INDEPENDENT_AMBULATORY_CARE_PROVIDER_SITE_OTHER): Payer: PRIVATE HEALTH INSURANCE | Admitting: Ophthalmology

## 2019-04-15 DIAGNOSIS — H25032 Anterior subcapsular polar age-related cataract, left eye: Secondary | ICD-10-CM | POA: Diagnosis not present

## 2019-04-15 DIAGNOSIS — H43813 Vitreous degeneration, bilateral: Secondary | ICD-10-CM

## 2019-04-15 DIAGNOSIS — H538 Other visual disturbances: Secondary | ICD-10-CM | POA: Diagnosis not present

## 2019-10-13 ENCOUNTER — Telehealth: Payer: Self-pay

## 2019-10-13 NOTE — Telephone Encounter (Signed)
Patient called in stating that she has had some swelling and "edema 2+ " also getting worse at night. Patient states she went to her rheumatologist and he said he couldn't help her. She was told to contact her PCP. Wanting to know if she can be worked in [employer]. 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 Leaving voicemail is ok

## 2019-10-13 NOTE — Telephone Encounter (Signed)
LM for pt to returncall

## 2019-10-13 NOTE — Telephone Encounter (Signed)
Patient returned call stating this had been going on for 1 week now and is getting worse. The pain is severe. Denies shortness of breath or any other symptoms. Pt advised this would be best assessed in person for further evaluation . Appt scheduled for tomorrrow

## 2019-10-14 ENCOUNTER — Encounter: Payer: Self-pay | Admitting: Family Medicine

## 2019-10-14 ENCOUNTER — Other Ambulatory Visit: Payer: Self-pay

## 2019-10-14 ENCOUNTER — Ambulatory Visit: Payer: PRIVATE HEALTH INSURANCE | Admitting: Family Medicine

## 2019-10-14 VITALS — BP 115/79 | HR 105 | Temp 98.1°F | Resp 16 | Ht 63.0 in | Wt 210.6 lb

## 2019-10-14 DIAGNOSIS — R600 Localized edema: Secondary | ICD-10-CM

## 2019-10-14 DIAGNOSIS — R350 Frequency of micturition: Secondary | ICD-10-CM

## 2019-10-14 MED ORDER — HYDROCHLOROTHIAZIDE 12.5 MG PO CAPS: 12.5000 mg | ORAL_CAPSULE | Freq: Every day | ORAL | 0 refills | Status: DC

## 2019-10-14 NOTE — Patient Instructions (Signed)
Start HCTZ one time a day in the morning if fluid present only.  Keep feet elevatde when able and wear compression stockings when on your feet  Low sodium diet and stay hydrated.   followup with Dr. Milinda Cave in 2 weeks if fluid still present or worsening.   Chronic Venous Insufficiency Chronic venous insufficiency is a condition where the leg veins cannot effectively pump blood from the legs to the heart. This happens when the vein walls are either stretched, weakened, or damaged, or when the valves inside the vein are damaged. With the right treatment, you should be able to continue with an active life. This condition is also called venous stasis. What are the causes? Common causes of this condition include:  High blood pressure inside the veins (venous hypertension).  Sitting or standing too long, causing increased blood pressure in the leg veins.  What increases the risk? The following factors may make you more likely to develop this condition:  Having a family history of this condition.  Obesity.  Pregnancy.  Living without enough regular physical activity or exercise (sedentary lifestyle).  Smoking.  Having a job that requires long periods of standing or sitting in one place.  Being a certain age. Women in their 27s and 39s and men in their 96s are more likely to develop this condition. What are the signs or symptoms? Symptoms of this condition include:  Veins that are enlarged, bulging, or twisted (varicose veins).  Skin breakdown or ulcers.  Reddened skin or dark discoloration of skin on the leg between the knee and ankle.  Brown, smooth, tight, and painful skin just above the ankle, usually on the inside of the leg (lipodermatosclerosis).  Swelling of the legs. How is this diagnosed? This condition may be diagnosed based on:  Your medical history.  A physical exam.  Tests, such as: ? A procedure that creates an image of a blood vessel and nearby organs and  provides information about blood flow through the blood vessel (duplex ultrasound). ? A procedure that tests blood flow (plethysmography). ? A procedure that looks at the veins using X-ray and dye (venogram). How is this treated? The goals of treatment are to help you return to an active life and to minimize pain or disability. Treatment depends on the severity of your condition, and it may include:  Wearing compression stockings. These can help relieve symptoms and help prevent your condition from getting worse. However, they do not cure the condition.  Sclerotherapy. This procedure involves an injection of a solution that shrinks damaged veins.  Surgery. This may involve: ? Removing a diseased vein (vein stripping). ? Cutting off blood flow through the vein (laser ablation surgery). ? Repairing or reconstructing a valve within the affected vein. Follow these instructions at home:      Wear compression stockings as told by your health care provider. These stockings help to prevent blood clots and reduce swelling in your legs.  Take over-the-counter and prescription medicines only as told by your health care provider.  Stay active by exercising, walking, or doing different activities. Ask your health care provider what activities are safe for you and how much exercise you need.  Drink enough fluid to keep your urine pale yellow.  Do not use any products that contain nicotine or tobacco, such as cigarettes, e-cigarettes, and chewing tobacco. If you need help quitting, ask your health care provider.  Keep all follow-up visits as told by your health care provider. This is important. Contact  a health care provider if you:  Have redness, swelling, or more pain in the affected area.  See a red streak or line that goes up or down from the affected area.  Have skin breakdown or skin loss in the affected area, even if the breakdown is small.  Get an injury in the affected area. Get help  right away if:  You get an injury and an open wound in the affected area.  You have: ? Severe pain that does not get better with medicine. ? Sudden numbness or weakness in the foot or ankle below the affected area. ? Trouble moving your foot or ankle. ? A fever. ? Worse or persistent symptoms. ? Chest pain. ? Shortness of breath. Summary  Chronic venous insufficiency is a condition where the leg veins cannot effectively pump blood from the legs to the heart.  Chronic venous insufficiency occurs when the vein walls become stretched, weakened, or damaged, or when valves within the vein are damaged.  Treatment depends on how severe your condition is. It often involves wearing compression stockings and may involve having a procedure.  Make sure you stay active by exercising, walking, or doing different activities. Ask your health care provider what activities are safe for you and how much exercise you need. This information is not intended to replace advice given to you by your health care provider. Make sure you discuss any questions you have with your health care provider. Document Revised: 02/09/2018 Document Reviewed: 02/09/2018 Elsevier Patient Education  [place].

## 2019-10-14 NOTE — Progress Notes (Signed)
This visit occurred during the SARS-CoV-2 public health emergency.  Safety protocols were in place, including screening questions prior to the visit, additional usage of staff PPE, and extensive cleaning of exam room while observing appropriate contact time as indicated for disinfecting solutions.    Erin Flores , Erin Flores, 34 y.o., Erin Flores MRN: [id number] Patient Care Team    Relationship Specialty Notifications Start End  McGowen, Maryjean Morn, MD PCP - General Family Medicine  08/06/18   Donnetta Hail, MD Consulting Physician Rheumatology  08/06/18     Chief Complaint  Patient presents with  . Edema    swelling has worsened in the last 4 days     Subjective: Pt presents for an OV with complaints of edema in her extremities.  She reports she has chronic lower extremity edema that typically improves at night when legs are elevated.  However with the last 4 days she has noticed edema in her lower extremities that did not improve with elevation.  She also felt that her rings were mildly tight.  She does stand on her feet for long periods of time at work.  She does not have compression stockings.  She denies recent increase in sodium or alcohol use.  She denies shortness of breath.  She does endorse mild urinary frequency.  Depression screen PHQ 2/9 11/05/2017  Decreased Interest 1  Down, Depressed, Hopeless 1  PHQ - 2 Score 2  Altered sleeping 1  Tired, decreased energy 1  Change in appetite 2  Feeling bad or failure about yourself  2  Trouble concentrating 0  Moving slowly or fidgety/restless 1  Suicidal thoughts 1  PHQ-9 Score 10    Allergies  Allergen Reactions  . Dilaudid [Hydromorphone Hcl] Other (See Comments)    Issues with urination   Social History   Social History Narrative   Married, 2 children 1 son and 1 daughter).   Educ: college, RMA school at [employer].   Occup: Forensic scientist.   Tob: 4 pack-yr hx; quit age 46   Alc:  occasionally.   Past Medical History:  Diagnosis Date  . Anxiety and depression   . Bipolar affective disorder [facility])    question of:  pt saw psychiatrist and was on lamictal and tegretol aroun 2018; takes clonoprin 2mg  prn for social anxiety.  he majory sx's were in the context of signif psychsocial probs with the father of her daughter.  GERD (gastroesophageal reflux disease)   . Herpes zoster 2017  . History of iron deficiency anemia   . History of pyelonephritis 2018  . Migraines    "I take a lot of ibuprofen".  Worse on OCPs.  Gets 8 tension HA's a month and 1-2 migraines per month.  . Psoriatic arthritis (HCC) 2014 dx   Improved with 6 mo of Enbrel as of 08/2018   Past Surgical History:  Procedure Laterality Date  . 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 Procedure: LAPAROSCOPIC CHOLECYSTECTOMY WITH INTRAOPERATIVE CHOLANGIOGRAM;  Surgeon: 08/21/2013, MD;  Location: [facility];  Service: General;  Laterality: N/A;   Family History  Problem Relation Age of Onset  . Alcohol abuse Mother   . Cervical cancer Mother   . Depression Mother   . Drug abuse Mother   . Hyperlipidemia Mother   . Hypertension Mother   . Learning disabilities Mother   . Bipolar disorder Mother   . Alcohol abuse Father   . Drug abuse Father   .  Hyperlipidemia Father   . Hypertension Father   . Cancer Maternal Aunt        breast  . Alcohol abuse Sister   . Depression Sister   . Drug abuse Sister   . Bipolar disorder Sister   . Depression Daughter   . Learning disabilities Daughter   . Alcohol abuse Maternal Grandmother   . Drug abuse Maternal Grandmother   . Early death Maternal Grandmother        MVA - drunk driving  . Alcohol abuse Maternal Grandfather   . Drug abuse Maternal Grandfather   . Early death Maternal Grandfather   . Alcohol abuse Paternal Grandfather   . Drug abuse Paternal Grandfather   . Early death Paternal Grandfather    Allergies as of 10/14/2019       Reactions   Dilaudid [hydromorphone Hcl] Other (See Comments)   Issues with urination      Medication List       Accurate as of Oct 14, 2019 11:59 PM. If you have any questions, ask your nurse or doctor.        STOP taking these medications   Enbrel SureClick 50 MG/ML injection Generic drug: etanercept Stopped by: Howard Pouch, DO     TAKE these medications   amphetamine-dextroamphetamine 20 MG tablet Commonly known as: ADDERALL Take 20 mg by mouth 2 (two) times daily.   busPIRone 15 MG tablet Commonly known as: BUSPAR Take 15 mg by mouth 2 (two) times daily.   clonazePAM 1 MG tablet Commonly known as: KLONOPIN TAKE 1 2 (ONE HALF) TABLET BY MOUTH IN A 24 HOUR PERIOD AS NEEDED FOR ANXIETY What changed: Another medication with the same name was removed. Continue taking this medication, and follow the directions you see here. Changed by: Howard Pouch, DO   divalproex 250 MG DR tablet Commonly known as: DEPAKOTE Take 250 mg by mouth 2 (two) times daily.   DULoxetine 60 MG capsule Commonly known as: CYMBALTA Take 60 mg by mouth daily.   hydrochlorothiazide 12.5 MG capsule Commonly known as: Microzide Take 1 capsule (12.5 mg total) by mouth daily. Started by: Howard Pouch, DO   ibuprofen 200 MG tablet Commonly known as: ADVIL Take 200 mg by mouth every 6 (six) hours as needed for fever.   NEXPLANON [place] Inject into the skin.   pantoprazole 40 MG tablet Commonly known as: PROTONIX Take 1 tablet (40 mg total) by mouth daily.   traZODone 150 MG tablet Commonly known as: DESYREL TAKE 1 2 (ONE HALF) TABLET BY MOUTH AT BEDTIME AS NEEDED FOR SLEEP       All past medical history, surgical history, allergies, family history, immunizations andmedications were updated in the EMR today and reviewed under the history and medication portions of their EMR.     ROS: Negative, with the exception of above mentioned in HPI   Objective:  BP 115/79 (BP Location: Left Arm,  Patient Position: Sitting, Cuff Size: Large)   Pulse (!) 105   Temp 98.1 F (36.7 C) (Temporal)   Resp 16   Ht 5\' 3"  (1.6 m)   Wt 210 lb 9.6 oz (95.5 kg)   SpO2 97%   BMI 37.31 kg/m  Body mass index is 37.31 kg/m. Gen: Afebrile. No acute distress. Nontoxic in appearance, well developed, well nourished.  HENT: AT. [place].  Eyes:Pupils Equal Round Reactive to light, Extraocular movements intact,  Conjunctiva without redness, discharge or icterus. CV: RRR no murmur, trace pitting edema BLE Chest: CTAB,  no wheeze or crackles. Good air movement, normal resp effort. .  Neuro:  Normal gait. PERLA. EOMi. Alert. Oriented x3  No exam data present No results found. No results found for this or any previous visit (from the past 24 hour(s)).  Assessment/Plan: Erin Flores is a 34 y.o. Erin Flores present for OV for  Urinary frequency We will send for urinalysis to be complete.  We will treat only if urine culture indicates need. - Urinalysis w microscopic + reflex cultur  Lower extremity edema: Patient with very mild lower extremity edema on exam today.  encouraged to purchase compression stockings and wear daily when on her feet. Keep feet elevated when able. Low-dose HCTZ prescribed to take only when edema is present. Low-sodium meals encouraged Follow-up with PCP in 2 weeks, sooner if worsening   Reviewed expectations re: course of current medical issues.  Discussed self-management of symptoms.  Outlined signs and symptoms indicating need for more acute intervention.  Patient verbalized understanding and all questions were answered.  Patient received an After-Visit Summary.    Orders Placed This Encounter  Procedures  . Urine Culture  . Urinalysis w microscopic + reflex cultur  . REFLEXIVE URINE CULTURE   Meds ordered this encounter  Medications  . hydrochlorothiazide (MICROZIDE) 12.5 MG capsule    Sig: Take 1 capsule (12.5 mg total) by mouth daily.    Dispense:  30  capsule    Refill:  0   Referral Orders  No referral(s) requested today     Note is dictated utilizing voice recognition software. Although note has been proof read prior to signing, occasional typographical errors still can be missed. If any questions arise, please do not hesitate to call for verification.   electronically signed by:  Felix Pacini, DO  [facility] - OR

## 2019-10-16 LAB — URINE CULTURE
MICRO NUMBER:: 10482664
Result:: NO GROWTH
SPECIMEN QUALITY:: ADEQUATE

## 2019-10-16 LAB — URINALYSIS W MICROSCOPIC + REFLEX CULTURE
Bilirubin Urine: NEGATIVE
Glucose, UA: NEGATIVE
Hgb urine dipstick: NEGATIVE
Hyaline Cast: NONE SEEN /LPF
Leukocyte Esterase: NEGATIVE
Nitrites, Initial: NEGATIVE
Protein, ur: NEGATIVE
Specific Gravity, Urine: 1.02 (ref 1.001–1.03)
pH: 7 (ref 5.0–8.0)

## 2019-10-16 LAB — CULTURE INDICATED

## 2019-10-17 ENCOUNTER — Encounter: Payer: Self-pay | Admitting: Family Medicine

## 2019-10-19 ENCOUNTER — Telehealth: Payer: Self-pay

## 2019-10-19 NOTE — Telephone Encounter (Signed)
Patient called in stating that after her visit with Dr. Claiborne Billings, nothing has gotten better. Patients states she is starting to have increase SOB with activities , as well as the swelling has not gone down in her legs. She states she does a lot of physical activity at work and the pain is more intense and SOB intense while doing these things. Says Dr.Kuneff prescribed her HTZ and thought she would start urinating more she will have to go and then have to sit and make herself finish going like its more of a strain than just naturally flowing. Patient is just wanting Dr. Claiborne Billings or Dr. Milinda Cave to help her with these issues because nothing is working and she doesn't know what to do  \   Please call and advise

## 2019-10-19 NOTE — Telephone Encounter (Signed)
Patient advised to continue taking the medication and purchase compression stockings to see if helps with leg swelling. Offered to make f/u appt with PCP, declined for now but will continue with Dr.Kuneff's recommendations.

## 2019-10-21 NOTE — Telephone Encounter (Signed)
Patient called the office today at 2:50pm to get advise as her leg swelling is still present and is now accompanied by stabbing pains.  She states that she is taking medications are prescribed and watching sodium intake along with elevating legs and using compression stalkings.  I advised that since there is no one available to see her this late in the day and the stabbing pains that she should report to ED or UC for treatment.  Patient voiced understanding.

## 2019-10-23 NOTE — Telephone Encounter (Signed)
Please make sure patient is not taking any ibuprofen Or aleve because these meds can sometimes cause a lot of swelling. Also have her schedule office visit with me at her earliest convenience. She can Go ahead and stop taking the HCTZ.

## 2019-10-24 NOTE — Telephone Encounter (Signed)
Your schedule is full all week, your only availability would be 4pm virtual visit on Wednesday.  Do you want to work her in somewhere?

## 2019-10-24 NOTE — Telephone Encounter (Signed)
Spoke to patient, she is scared to stop HCTZ.  She states that the swelling is still present but it is down compared to what it was prior.  She states she hasn't been taking any ibuprofen or aleve.  She states that if there is anyway to work her in - Wednesday as late as possible would work.  She works in [employer] and they are short staffed so it's hard to get off work.  Please advise if you can work her in Wednesday at 4pm?

## 2019-10-24 NOTE — Telephone Encounter (Signed)
Patient advised.  She will be here 5/26 at 4pm.

## 2019-10-24 NOTE — Telephone Encounter (Signed)
OK to work in at 4 on Wednesday but must be in person visit.

## 2019-10-26 ENCOUNTER — Ambulatory Visit: Payer: PRIVATE HEALTH INSURANCE | Admitting: Family Medicine

## 2019-10-26 ENCOUNTER — Encounter: Payer: Self-pay | Admitting: Family Medicine

## 2019-10-26 ENCOUNTER — Other Ambulatory Visit: Payer: Self-pay

## 2019-10-26 VITALS — BP 132/83 | HR 106 | Temp 98.2°F | Resp 16 | Ht 63.0 in | Wt 210.0 lb

## 2019-10-26 DIAGNOSIS — R600 Localized edema: Secondary | ICD-10-CM

## 2019-10-26 MED ORDER — FUROSEMIDE 20 MG PO TABS
ORAL_TABLET | ORAL | 1 refills | Status: DC
Start: 2019-10-26 — End: 2021-01-23

## 2019-10-26 NOTE — Progress Notes (Signed)
OFFICE VISIT  10/26/2019   CC:  Chief Complaint  Patient presents with  . Leg Swelling   HPI:    Patient is a 34 y.o. Caucasian female with psoriatic arthritis who presents for legs swelling. Was seen by Dr. Raoul Pitch 5/14 for this, hctz rx'd, urinalysis not available in EMR but clx was done and returned no growth. Pt called back 5d later and reported she was not improved any, also had started getting intermittent stabbing pains in legs, so we arranged for her to come back for recheck.  Reports long hx of intermittent swelling of LL's and hands/fingers. This worsened a couple weeks ago, couldn't wear rings or watch b/c they would leave indention.  Last week began to notice legs feeling weak and heavy when walking (esp up stairs) and they hurt diffusely below the knees.  Had some intermittent stabbing pains in LL's that got better when she rubbed them.  The swelling has improved some in the last week or so.  Otherwise has been feeling well other than mild "washed out" feeling. Has been taking hctz since back on 5/14 as rx'd, did not urinate more than her usual. She stopped this med a few days ago. Has been wearing compression stockings. Drinks plenty of water.   Doesn't eat high sodium diet, no change in dietary habits prior to onset of her swelling. Says sleep is good, does not snore.  No morning HAs. No excessive daytime sleepiness. Her psych meds were "all changed" a couple months ago when she started seeing a diff psychiatrist.  No changes in swelling noted after these med changes.  Is doing much better from psych standpoint on current meds.  ROS: as per HPI.  Also-> no fevers, no CP, no SOB, no wheezing, no cough, no dizziness, no HAs, no rashes, no melena/hematochezia.  No polyuria or polydipsia.  No myalgias or arthralgias.  No focal weakness, paresthesias, or tremors.  No acute vision or hearing abnormalities. No n/v/d or abd pain.  No palpitations.     Past Medical History:   Diagnosis Date  . Anxiety and depression   . Bipolar affective disorder [facility])    question of:  pt saw psychiatrist and was on lamictal and tegretol aroun 2018; takes clonoprin 2mg  prn for social anxiety.  he majory sx's were in the context of signif psychsocial probs with the father of her daughter.  Marland Kitchen GERD (gastroesophageal reflux disease)   . Herpes zoster 2017  . History of iron deficiency anemia   . History of pyelonephritis 2018  . Migraines    "I take a lot of ibuprofen".  Worse on OCPs.  Gets 8 tension HA's a month and 1-2 migraines per month.  . Psoriatic arthritis ([place]) 2014 dx   Improved with 6 mo of Enbrel as of 08/2018    Past Surgical History:  Procedure Laterality Date  . 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 Procedure: LAPAROSCOPIC CHOLECYSTECTOMY WITH INTRAOPERATIVE CHOLANGIOGRAM;  Surgeon: Earnstine Regal, MD;  Location: [facility];  Service: General;  Laterality: N/A;    Outpatient Medications Prior to Visit  Medication Sig Dispense Refill  . amphetamine-dextroamphetamine (ADDERALL) 20 MG tablet Take 20 mg by mouth 2 (two) times daily.    . busPIRone (BUSPAR) 15 MG tablet Take 15 mg by mouth 2 (two) times daily.    . clonazePAM (KLONOPIN) 1 MG tablet TAKE 1 2 (ONE HALF) TABLET BY MOUTH IN A 24 HOUR PERIOD AS NEEDED FOR ANXIETY    .  divalproex (DEPAKOTE) 250 MG DR tablet Take 250 mg by mouth 2 (two) times daily.    . DULoxetine (CYMBALTA) 60 MG capsule Take 60 mg by mouth daily.    . Etonogestrel (NEXPLANON [place]) Inject into the skin.    . pantoprazole (PROTONIX) 40 MG tablet Take 1 tablet (40 mg total) by mouth daily. 30 tablet 3  . traZODone (DESYREL) 150 MG tablet TAKE 1 2 (ONE HALF) TABLET BY MOUTH AT BEDTIME AS NEEDED FOR SLEEP    . ibuprofen (ADVIL,MOTRIN) 200 MG tablet Take 200 mg by mouth every 6 (six) hours as needed for fever.     . hydrochlorothiazide (MICROZIDE) 12.5 MG capsule Take 1 capsule (12.5 mg total) by mouth daily. (Patient not taking:  Reported on 10/26/2019) 30 capsule 0   No facility-administered medications prior to visit.    Allergies  Allergen Reactions  . Dilaudid [Hydromorphone Hcl] Other (See Comments)    Issues with urination    ROS As per HPI  PE: Vitals with BMI 10/26/2019 10/14/2019 08/06/2018  Height 5\' 3"  5\' 3"  5\' 3"   Weight 210 lbs 210 lbs 10 oz 190 lbs  BMI 37.21 37.32 33.67  Systolic 132 115  Diastolic 83 79 85  Pulse 106 105 74    Gen: Alert, well appearing.  Patient is oriented to person, place, time, and situation. AFFECT: pleasant, lucid thought and speech. Neck - No masses or thyromegaly or limitation in range of motion CV: RRR, no m/r/g.   LUNGS: CTA bilat, nonlabored resps, good aeration in all lung fields. EXT: no clubbing or cyanosis.  No pitting of wrists or hands/fingers.  1+ pitting edema in pretibial regions bilat.  No varicosities.  No tenderness of calves.  R calf circ at 10 cm below inf border of patella is 42 cm, L is 42.5 cm.  LABS:   Lab Results  Component Value Date   WBC 8.4 08/06/2018   HGB 14.1 08/06/2018   HCT 43 08/06/2018   MCV 81.7 12/31/2016   PLT 269 12/31/2016   Lab Results  Component Value Date   CREATININE 0.8 08/06/2018   BUN 8 08/06/2018   NA 143 08/06/2018   K 4.4 08/06/2018   CL 105 12/31/2016   CO2 24 12/31/2016   Lab Results  Component Value Date   ALT 81 (A) 08/06/2018   AST 25 08/06/2018   ALKPHOS 74 08/06/2018   BILITOT 0.8 04/14/2015    IMPRESSION AND PLAN:  1) Extremity edema, primary LL's.  Minimal pitting edema on exam today. Unclear etiology but suspect Na intake could be contributing, possible early venous insufficiency, as well as possibly side effect from one or multiple psych meds that were started 08/2019.  Hx of lots of NSAID use in remote past but nothing lately. Reassured pt. Reiterated focus on low Na, wear compression stockings prn, and will do trial of lasix 20mg  qd prn.  No further hctz. Check CMET and TSH  today.  An After Visit Summary was printed and given to the patient.  FOLLOW UP: Return in about 3 months (around 01/26/2020) for f/u edema.  Signed:  13/05/2015, MD           10/26/2019

## 2019-10-27 LAB — COMPREHENSIVE METABOLIC PANEL
ALT: 25 U/L (ref 0–35)
AST: 15 U/L (ref 0–37)
Albumin: 4.3 g/dL (ref 3.5–5.2)
Alkaline Phosphatase: 73 U/L (ref 39–117)
BUN: 14 mg/dL (ref 6–23)
CO2: 30 mEq/L (ref 19–32)
Calcium: 9.2 mg/dL (ref 8.4–10.5)
Chloride: 102 mEq/L (ref 96–112)
Creatinine, Ser: 0.75 mg/dL (ref 0.40–1.20)
GFR: 87.48 mL/min (ref >60.00)
Glucose, Bld: 99 mg/dL (ref 70–99)
Potassium: 4.2 mEq/L (ref 3.5–5.1)
Sodium: 137 mEq/L (ref 135–145)
Total Bilirubin: 0.3 mg/dL (ref 0.2–1.2)
Total Protein: 6.4 g/dL (ref 6.0–8.3)

## 2019-10-27 LAB — TSH: TSH: 2.91 u[IU]/mL (ref 0.35–4.50)

## 2019-12-23 ENCOUNTER — Other Ambulatory Visit (HOSPITAL_COMMUNITY): Payer: Self-pay

## 2019-12-23 ENCOUNTER — Emergency Department (HOSPITAL_COMMUNITY): Payer: PRIVATE HEALTH INSURANCE

## 2019-12-23 ENCOUNTER — Telehealth: Payer: Self-pay

## 2019-12-23 ENCOUNTER — Emergency Department (HOSPITAL_BASED_OUTPATIENT_CLINIC_OR_DEPARTMENT_OTHER): Payer: PRIVATE HEALTH INSURANCE

## 2019-12-23 ENCOUNTER — Emergency Department (HOSPITAL_COMMUNITY)
Admission: EM | Admit: 2019-12-23 | Discharge: 2019-12-23 | Disposition: A | Discharge status: Home / Self Care | Payer: PRIVATE HEALTH INSURANCE | Attending: Emergency Medicine | Admitting: Emergency Medicine

## 2019-12-23 ENCOUNTER — Encounter (HOSPITAL_COMMUNITY): Payer: Self-pay | Admitting: Pediatrics

## 2019-12-23 ENCOUNTER — Other Ambulatory Visit: Payer: Self-pay

## 2019-12-23 DIAGNOSIS — R600 Localized edema: Secondary | ICD-10-CM

## 2019-12-23 DIAGNOSIS — Z5321 Procedure and treatment not carried out due to patient leaving prior to being seen by health care provider: Secondary | ICD-10-CM | POA: Diagnosis not present

## 2019-12-23 DIAGNOSIS — R2243 Localized swelling, mass and lump, lower limb, bilateral: Secondary | ICD-10-CM | POA: Diagnosis not present

## 2019-12-23 DIAGNOSIS — R079 Chest pain, unspecified: Secondary | ICD-10-CM | POA: Insufficient documentation

## 2019-12-23 DIAGNOSIS — R0602 Shortness of breath: Secondary | ICD-10-CM | POA: Diagnosis not present

## 2019-12-23 LAB — BASIC METABOLIC PANEL
Anion gap: 11 (ref 5–15)
BUN: 9 mg/dL (ref 6–20)
CO2: 25 mmol/L (ref 22–32)
Calcium: 9.2 mg/dL (ref 8.9–10.3)
Chloride: 102 mmol/L (ref 98–111)
Creatinine, Ser: 0.97 mg/dL (ref 0.44–1.00)
GFR calc Af Amer: >60 mL/min (ref >60)
GFR calc non Af Amer: >60 mL/min (ref >60)
Glucose, Bld: 100 mg/dL — ABNORMAL HIGH (ref 70–99)
Potassium: 3.8 mmol/L (ref 3.5–5.1)
Sodium: 138 mmol/L (ref 135–145)

## 2019-12-23 LAB — CBC
HCT: 43.1 % (ref 36.0–46.0)
Hemoglobin: 13.6 g/dL (ref 12.0–15.0)
MCH: 27 pg (ref 26.0–34.0)
MCHC: 31.6 g/dL (ref 30.0–36.0)
MCV: 85.7 fL (ref 80.0–100.0)
Platelets: 289 10*3/uL (ref 150–400)
RBC: 5.03 MIL/uL (ref 3.87–5.11)
RDW: 13.3 % (ref 11.5–15.5)
WBC: 8.6 10*3/uL (ref 4.0–10.5)
nRBC: 0 % (ref 0.0–0.2)

## 2019-12-23 LAB — BRAIN NATRIURETIC PEPTIDE: B Natriuretic Peptide: 11.7 pg/mL (ref 0.0–100.0)

## 2019-12-23 LAB — I-STAT BETA HCG BLOOD, ED (MC, WL, AP ONLY): I-stat hCG, quantitative: <5 m[IU]/mL (ref <5)

## 2019-12-23 LAB — TROPONIN I (HIGH SENSITIVITY): Troponin I (High Sensitivity): 3 ng/L (ref <18)

## 2019-12-23 MED ORDER — SODIUM CHLORIDE 0.9% FLUSH: 3.0000 mL | Freq: Once | INTRAVENOUS | Status: DC

## 2019-12-23 NOTE — Telephone Encounter (Signed)
Patient Name: Erin Flores Gender: Female DOB: June 02, 1986 Age: 34 Y 26 D Return Phone Number: [phone] (Primary) Address: City/State/Zip: [place] [address] Client [facility] Day - Client Client Site [facility] - Day Contact Type Call Who Is Calling Patient / Member / Family / Caregiver Call Type Triage / Clinical Relationship To Patient Self Return Phone Number [phone] (Primary) Chief Complaint BREATHING - shortness of breath or sounds breathless Reason for Call Symptomatic / Request for Health Information Initial Comment Caller states she has been on Lasix for swollen legs. Her legs are severely swollen and had SOB yesterday. She had chest pain and did get an x-ray and it was okay. GOTO Facility Not Listed [place] Translation No Nurse Assessment Nurse: Clarita Leber, RN, Gavin Pound Date/Time (Eastern Time): 12/23/2019 10:51:00 AM Confirm and document reason for call. If symptomatic, describe symptoms. ---The caller states that she has leg swelling and yesterday she had a normal chest x-ray yesterday. The swelling goes up to her knees. SOB at rest. Has the patient had close contact with a person known or suspected to have the novel coronavirus illness OR traveled / lives in area with major community spread (including international travel) in the last 14 days from the onset of symptoms? * If Asymptomatic, screen for exposure and travel within the last 14 days. ---No Does the patient have any new or worsening symptoms? ---Yes Will a triage be completed? ---Yes Related visit to physician within the last 2 weeks? ---No Does the PT have any chronic conditions? (i.e. diabetes, asthma, this includes High risk factors for pregnancy, etc.) ---Yes List chronic conditions. ---psoriatic arthritis and psychiatric Is the patient pregnant or possibly pregnant? (Ask all females between the ages of 19-55) ---No Is this a behavioral health or  substance abuse call? ---No Guidelines Guideline Title Affirmed Question Affirmed Notes Nurse Date/Time Lamount Cohen Time) Leg Swelling and Edema [1] Difficulty breathing with exertion (e.g., [place], RN, Gavin Pound 12/23/2019 10:56:42 AMPLEASE NOTE: All timestamps contained within this report are represented as [place] Standard Time. CONFIDENTIALTY NOTICE: This fax transmission is intended only for the addressee. It contains information that is legally privileged, confidential or otherwise protected from use or disclosure. If you are not the intended recipient, you are strictly prohibited from reviewing, disclosing, copying using or disseminating any of this information or taking any action in reliance on or regarding this information. If you have received this fax in error, please notify us immediately by telephone so that we can arrange for its return to [place]. Phone: [phone], Toll-Free: [phone], Fax: [phone] Page: 2 of 2 Call Id: [id number] Guidelines Guideline Title Affirmed Question Affirmed Notes Nurse Date/Time Lamount Cohen Time) walking) AND [2] newonset or worsening Disp. Time Lamount Cohen Time) Disposition Final User 12/23/2019 10:46:23 AM Send to Urgent Queue Josephina Shih 12/23/2019 10:59:25 AM Go to ED Now (or PCP triage) Yes Clarita Leber, RN, Jetty Duhamel Disagree/Comply Comply Caller Understands Yes PreDisposition Go to ED Care Advice Given Per Guideline GO TO ED NOW (OR PCP TRIAGE): * It is better and safer if another adult drives instead of you. Referrals GO TO FACILITY OTHER - SPECIFY

## 2019-12-23 NOTE — ED Triage Notes (Signed)
Patient c/o worsening bilateral lower extremities swelling. Stated PCP started her on Lasix 2 months ago. Pt c/o chest pain and short of breath as well as pain w/ ambulation.

## 2019-12-23 NOTE — Telephone Encounter (Signed)
Please call. She was seen in the ED today and not sure what else she needs to do from here  [phone]

## 2019-12-26 NOTE — Telephone Encounter (Signed)
Patient was last seen for this issue on 5/26 and given lasix 40mg  #30 with 1 refill. No recommendations noted in ED note but labs and DVT completed.   Please advise, thanks.

## 2019-12-30 ENCOUNTER — Encounter: Payer: Self-pay | Admitting: Family Medicine

## 2020-01-04 ENCOUNTER — Emergency Department (HOSPITAL_BASED_OUTPATIENT_CLINIC_OR_DEPARTMENT_OTHER): Payer: PRIVATE HEALTH INSURANCE

## 2020-01-04 ENCOUNTER — Emergency Department (HOSPITAL_BASED_OUTPATIENT_CLINIC_OR_DEPARTMENT_OTHER)
Admission: EM | Admit: 2020-01-04 | Discharge: 2020-01-04 | Disposition: A | Discharge status: Home / Self Care | Payer: PRIVATE HEALTH INSURANCE | Attending: Emergency Medicine | Admitting: Emergency Medicine

## 2020-01-04 ENCOUNTER — Encounter (HOSPITAL_BASED_OUTPATIENT_CLINIC_OR_DEPARTMENT_OTHER): Payer: Self-pay | Admitting: Emergency Medicine

## 2020-01-04 ENCOUNTER — Other Ambulatory Visit: Payer: Self-pay

## 2020-01-04 DIAGNOSIS — K219 Gastro-esophageal reflux disease without esophagitis: Secondary | ICD-10-CM | POA: Diagnosis not present

## 2020-01-04 DIAGNOSIS — F17210 Nicotine dependence, cigarettes, uncomplicated: Secondary | ICD-10-CM | POA: Diagnosis not present

## 2020-01-04 DIAGNOSIS — R197 Diarrhea, unspecified: Secondary | ICD-10-CM | POA: Insufficient documentation

## 2020-01-04 DIAGNOSIS — R1031 Right lower quadrant pain: Secondary | ICD-10-CM

## 2020-01-04 DIAGNOSIS — Z7901 Long term (current) use of anticoagulants: Secondary | ICD-10-CM | POA: Insufficient documentation

## 2020-01-04 DIAGNOSIS — R112 Nausea with vomiting, unspecified: Secondary | ICD-10-CM | POA: Diagnosis present

## 2020-01-04 LAB — CBC WITH DIFFERENTIAL/PLATELET
Abs Immature Granulocytes: 0.06 K/uL (ref 0.00–0.07)
Basophils Absolute: 0 K/uL (ref 0.0–0.1)
Basophils Relative: 0 %
Eosinophils Absolute: 0.1 K/uL (ref 0.0–0.5)
Eosinophils Relative: 1 %
HCT: 40.5 % (ref 36.0–46.0)
Hemoglobin: 13.1 g/dL (ref 12.0–15.0)
Immature Granulocytes: 1 %
Lymphocytes Relative: 29 %
Lymphs Abs: 2.8 K/uL (ref 0.7–4.0)
MCH: 26.8 pg (ref 26.0–34.0)
MCHC: 32.3 g/dL (ref 30.0–36.0)
MCV: 83 fL (ref 80.0–100.0)
Monocytes Absolute: 0.5 K/uL (ref 0.1–1.0)
Monocytes Relative: 5 %
Neutro Abs: 6.4 K/uL (ref 1.7–7.7)
Neutrophils Relative %: 64 %
Platelets: 276 K/uL (ref 150–400)
RBC: 4.88 MIL/uL (ref 3.87–5.11)
RDW: 13.5 % (ref 11.5–15.5)
WBC: 9.9 K/uL (ref 4.0–10.5)
nRBC: 0 % (ref 0.0–0.2)

## 2020-01-04 LAB — WET PREP, GENITAL
Sperm: NONE SEEN
Trich, Wet Prep: NONE SEEN
Yeast Wet Prep HPF POC: NONE SEEN

## 2020-01-04 LAB — COMPREHENSIVE METABOLIC PANEL WITH GFR
ALT: 41 U/L (ref 0–44)
AST: 31 U/L (ref 15–41)
Albumin: 4.1 g/dL (ref 3.5–5.0)
Alkaline Phosphatase: 67 U/L (ref 38–126)
Anion gap: 13 (ref 5–15)
BUN: 13 mg/dL (ref 6–20)
CO2: 25 mmol/L (ref 22–32)
Calcium: 8.6 mg/dL — ABNORMAL LOW (ref 8.9–10.3)
Chloride: 101 mmol/L (ref 98–111)
Creatinine, Ser: 0.79 mg/dL (ref 0.44–1.00)
GFR calc Af Amer: >60 mL/min
GFR calc non Af Amer: >60 mL/min
Glucose, Bld: 89 mg/dL (ref 70–99)
Potassium: 3.9 mmol/L (ref 3.5–5.1)
Sodium: 139 mmol/L (ref 135–145)
Total Bilirubin: 0.1 mg/dL — ABNORMAL LOW (ref 0.3–1.2)
Total Protein: 7.3 g/dL (ref 6.5–8.1)

## 2020-01-04 LAB — URINALYSIS, ROUTINE W REFLEX MICROSCOPIC
Bilirubin Urine: NEGATIVE
Glucose, UA: NEGATIVE mg/dL
Hgb urine dipstick: NEGATIVE
Ketones, ur: NEGATIVE mg/dL
Leukocytes,Ua: NEGATIVE
Nitrite: NEGATIVE
Protein, ur: NEGATIVE mg/dL
Specific Gravity, Urine: <1.005 — ABNORMAL LOW (ref 1.005–1.030)
pH: 7 (ref 5.0–8.0)

## 2020-01-04 LAB — PREGNANCY, URINE: Preg Test, Ur: NEGATIVE

## 2020-01-04 LAB — LIPASE, BLOOD: Lipase: 27 U/L (ref 11–51)

## 2020-01-04 MED ORDER — IOHEXOL 300 MG/ML  SOLN
100.0000 mL | Freq: Once | INTRAMUSCULAR | Status: AC | PRN
  Administered 2020-01-04: 100 mL via INTRAVENOUS

## 2020-01-04 MED ORDER — SODIUM CHLORIDE 0.9 % IV BOLUS
1000.0000 mL | Freq: Once | INTRAVENOUS | Status: AC
  Administered 2020-01-04: 1000 mL via INTRAVENOUS

## 2020-01-04 MED ORDER — ONDANSETRON 4 MG PO TBDP
ORAL_TABLET | ORAL | Status: AC
  Filled 2020-01-04: qty 1

## 2020-01-04 MED ORDER — FENTANYL CITRATE (PF) 100 MCG/2ML IJ SOLN
50.0000 ug | Freq: Once | INTRAMUSCULAR | Status: AC
  Administered 2020-01-04: 50 ug via INTRAVENOUS
  Filled 2020-01-04: qty 2

## 2020-01-04 MED ORDER — ONDANSETRON HCL 4 MG/2ML IJ SOLN
4.0000 mg | Freq: Once | INTRAMUSCULAR | Status: AC
  Administered 2020-01-04: 4 mg via INTRAVENOUS
  Filled 2020-01-04: qty 2

## 2020-01-04 MED ORDER — ONDANSETRON HCL 4 MG/2ML IJ SOLN
INTRAMUSCULAR | Status: AC
  Filled 2020-01-04: qty 2

## 2020-01-04 MED ORDER — ONDANSETRON HCL 4 MG PO TABS
4.0000 mg | ORAL_TABLET | Freq: Once | ORAL | Status: DC
  Filled 2020-01-04: qty 1

## 2020-01-04 MED ORDER — ONDANSETRON 4 MG PO TBDP
4.0000 mg | ORAL_TABLET | Freq: Once | ORAL | Status: AC
  Administered 2020-01-04: 4 mg via ORAL
  Filled 2020-01-04: qty 1

## 2020-01-04 MED ORDER — ONDANSETRON 4 MG PO TBDP: 4.0000 mg | ORAL_TABLET | Freq: Three times a day (TID) | ORAL | 0 refills | Status: DC | PRN

## 2020-01-04 MED ORDER — MORPHINE SULFATE (PF) 4 MG/ML IV SOLN
4.0000 mg | Freq: Once | INTRAVENOUS | Status: AC
  Administered 2020-01-04: 4 mg via INTRAVENOUS
  Filled 2020-01-04: qty 1

## 2020-01-04 MED ORDER — ONDANSETRON 4 MG PO TBDP
4.0000 mg | ORAL_TABLET | Freq: Once | ORAL | Status: AC
  Administered 2020-01-04: 4 mg via ORAL

## 2020-01-04 NOTE — Discharge Instructions (Addendum)
Please read instructions below. Drink clear liquids until your stomach feels better. Then, slowly introduce bland foods into your diet as tolerated, such as bread, rice, apples, bananas. You can take zofran every 8 hours as needed for nausea. You can take Tylenol every 4-6 hours as needed for pain. You can take Imodium as needed for diarrhea. If you continue to have persisting or worsening pain to your right lower abdomen, return to the emergency department for urgent reevaluation. Follow up with your primary care. Return to the ER for severe abdominal pain, fever, uncontrollable vomiting, or new or concerning symptoms.

## 2020-01-04 NOTE — ED Triage Notes (Signed)
Pt having N/V/D since last night, emesis x 2, multiple diarrhea.  No fever.  Possible exposure to covid but vaccinated.

## 2020-01-04 NOTE — ED Provider Notes (Signed)
[facility] EMERGENCY DEPARTMENT Provider Note   CSN: [id number] Arrival date & time: 01/04/20  8416     History Chief Complaint  Patient presents with  . Emesis    Erin Flores is a 34 y.o. female presenting to the emergency department with complaint of nausea, vomiting, diarrhea that began last night.  She states she has had multiple episodes of watery diarrhea.  She has severe pain to the right lower quadrant of her abdomen that is worse with movement.  She states it is a sharp stabbing pain.  Denies fevers or urinary symptoms, denies pelvic complaints.  Past abdominal surgeries include cholecystectomy.  No medications taken for symptoms.  Denies recent travel, new medications or antibiotic use.  No new foods yesterday.  The history is provided by the patient.       Past Medical History:  Diagnosis Date  . Anxiety and depression   . Bipolar affective disorder [facility])    question of:  pt saw psychiatrist and was on lamictal and tegretol aroun 2018; takes clonoprin 2mg  prn for social anxiety.  he majory sx's were in the context of signif psychsocial probs with the father of her daughter.  GERD (gastroesophageal reflux disease)   . Herpes zoster 2017  . History of iron deficiency anemia   . History of pyelonephritis 2018  . Migraines    "I take a lot of ibuprofen".  Worse on OCPs.  Gets 8 tension HA's a month and 1-2 migraines per month.  . Psoriatic arthritis (HCC) 2014 dx   Enbrel 2020. Simponi 2021->?htn and edema rx?->stopped by Dr. 2021 12/2019.    Patient Active Problem List   Diagnosis Date Noted  . Encounter for surveillance of Nexplanon subdermal contraceptive 11/05/2017  . Bipolar affective, manic (HCC) 11/05/2017  . Biliary dyskinesia 08/16/2013    Past Surgical History:  Procedure Laterality Date  . 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 Procedure: LAPAROSCOPIC CHOLECYSTECTOMY WITH INTRAOPERATIVE CHOLANGIOGRAM;  Surgeon: 08/21/2013, MD;  Location: [facility]S;  Service: General;  Laterality: N/A;     OB History   No obstetric history on file.     Family History  Problem Relation Age of Onset  . Alcohol abuse Mother   . Cervical cancer Mother   . Depression Mother   . Drug abuse Mother   . Hyperlipidemia Mother   . Hypertension Mother   . Learning disabilities Mother   . Bipolar disorder Mother   . Alcohol abuse Father   . Drug abuse Father   . Hyperlipidemia Father   . Hypertension Father   . Cancer Maternal Aunt        breast  . Alcohol abuse Sister   . Depression Sister   . Drug abuse Sister   . Bipolar disorder Sister   . Depression Daughter   . Learning disabilities Daughter   . Alcohol abuse Maternal Grandmother   . Drug abuse Maternal Grandmother   . Early death Maternal Grandmother        MVA - drunk driving  . Alcohol abuse Maternal Grandfather   . Drug abuse Maternal Grandfather   . Early death Maternal Grandfather   . Alcohol abuse Paternal Grandfather   . Drug abuse Paternal Grandfather   . Early death Paternal Grandfather     Social History   Tobacco Use  . Smoking status: Former Smoker    Packs/day: 0.25    Years: 8.00    Pack years: 2.00  Types: Cigarettes    Quit date: 06/02/2006    Years since quitting: 13.6  . Smokeless tobacco: Never Used  Vaping Use  . Vaping Use: Never used  Substance Use Topics  . Alcohol use: Yes    Comment: occasionally  . Drug use: No    Home Medications Prior to Admission medications   Medication Sig Start Date End Date Taking? Authorizing Provider  amphetamine-dextroamphetamine (ADDERALL) 20 MG tablet Take 20 mg by mouth 2 (two) times daily. 09/19/19  Yes [provider]  busPIRone (BUSPAR) 15 MG tablet Take 15 mg by mouth 2 (two) times daily. 10/13/19  Yes [provider]  etonogestrel (NEXPLANON) 68 MG IMPL implant Inject 68 mg into the skin once.    Yes [provider]  furosemide (LASIX) 20 MG tablet 1  tab po qd prn swelling Patient taking differently: Take 20 mg by mouth daily as needed (swelling).  10/26/19  Yes McGowen, Maryjean Morn, MD  zaleplon (SONATA) 10 MG capsule Take 10 mg by mouth at bedtime as needed for sleep. 01/03/20  Yes [provider]  LORazepam (ATIVAN) 0.5 MG tablet Take 0.5 mg by mouth 3 (three) times daily as needed for anxiety. 01/03/20   [provider]  ondansetron (ZOFRAN ODT) 4 MG disintegrating tablet Take 1 tablet (4 mg total) by mouth every 8 (eight) hours as needed for nausea or vomiting. 01/04/20   Damarrion Mimbs, [place] N, PA-C  traZODone (DESYREL) 150 MG tablet TAKE 1 2 (ONE HALF) TABLET BY MOUTH AT BEDTIME AS NEEDED FOR SLEEP Patient not taking: Reported on 01/04/2020 10/13/19   [provider]    Allergies    Dilaudid [hydromorphone hcl] and Simponi [golimumab]  Review of Systems   Review of Systems  Constitutional: Negative for fever.  HENT: Negative for congestion and sore throat.   Respiratory: Negative for cough.   Gastrointestinal: Positive for abdominal pain, diarrhea, nausea and vomiting.  Genitourinary: Negative for dysuria, frequency, pelvic pain, vaginal bleeding and vaginal discharge.  All other systems reviewed and are negative.   Physical Exam Updated Vital Signs BP (!) 147/92 (BP Location: Right Wrist)   Pulse 97   Temp 98.6 F (37 C)   Resp 16   Ht 5\' 3"  (1.6 m)   Wt 101.2 kg   SpO2 100%   BMI 39.50 kg/m   Physical Exam Vitals and nursing note reviewed. Exam conducted with a chaperone present.  Constitutional:      Appearance: She is well-developed.  HENT:     Head: Normocephalic and atraumatic.  Eyes:     Conjunctiva/sclera: Conjunctivae normal.  Cardiovascular:     Rate and Rhythm: Normal rate and regular rhythm.  Pulmonary:     Effort: Pulmonary effort is normal. No respiratory distress.     Breath sounds: Normal breath sounds.  Abdominal:     General: Bowel sounds are normal.     Palpations: Abdomen is  soft.     Tenderness: There is abdominal tenderness in the right lower quadrant. There is no guarding or rebound. Positive signs include Rovsing's sign.  Genitourinary:    General: Normal vulva.     Vagina: No erythema or tenderness.     Cervix: No cervical motion tenderness or friability.     Uterus: Normal.      Adnexa:        Right: Tenderness (slight) present.        Left: No tenderness.       Comments: Small-moderate amount clear/white discharge  present. Skin:    General: Skin is warm.  Neurological:     Mental Status: She is alert.  Psychiatric:        Behavior: Behavior normal.     ED Results / Procedures / Treatments   Labs (all labs ordered are listed, but only abnormal results are displayed) Labs Reviewed  WET PREP, GENITAL - Abnormal; Notable for the following components:      Result Value   Clue Cells Wet Prep HPF POC PRESENT (*)    WBC, Wet Prep HPF POC FEW (*)    All other components within normal limits  COMPREHENSIVE METABOLIC PANEL - Abnormal; Notable for the following components:   Calcium 8.6 (*)    Total Bilirubin 0.1 (*)    All other components within normal limits  URINALYSIS, ROUTINE W REFLEX MICROSCOPIC - Abnormal; Notable for the following components:   Specific Gravity, Urine <1.005 (*)    All other components within normal limits  LIPASE, BLOOD  CBC WITH DIFFERENTIAL/PLATELET  PREGNANCY, URINE  GC/CHLAMYDIA PROBE AMP ([place]) NOT AT [facility]    EKG None  Radiology CT Abdomen Pelvis W Contrast  Result Date: 01/04/2020 CLINICAL DATA:  Acute onset right lower quadrant pain with nausea and vomiting beginning last night. Suspected appendicitis. EXAM: CT ABDOMEN AND PELVIS WITH CONTRAST TECHNIQUE: Multidetector CT imaging of the abdomen and pelvis was performed using the standard protocol following bolus administration of intravenous contrast. CONTRAST:  OMNIPAQUE IOHEXOL 300 MG/ML  SOLN COMPARISON:  Noncontrast CT on 04/14/2015 FINDINGS:  Lower Chest: No acute findings. Hepatobiliary: No hepatic masses identified. Mild diffuse hepatic steatosis noted. Prior cholecystectomy. No evidence of biliary obstruction. Pancreas:  No mass or inflammatory changes. Spleen: Within normal limits in size and appearance. Adrenals/Urinary Tract: No masses identified. No evidence of ureteral calculi or hydronephrosis. Stomach/Bowel: No evidence of obstruction, inflammatory process or abnormal fluid collections. Normal appendix visualized. Vascular/Lymphatic: No pathologically enlarged lymph nodes. No abdominal aortic aneurysm. Reproductive:  No mass or other significant abnormality. Other:  None. Musculoskeletal:  No suspicious bone lesions identified. IMPRESSION: No evidence of appendicitis or other acute findings. Mild hepatic steatosis. Electronically Signed   By: Danae Orleans M.D.   On: 01/04/2020 12:15   US PELVIC COMPLETE W TRANSVAGINAL AND TORSION R/O  Result Date: 01/04/2020 CLINICAL DATA:  RIGHT lower quadrant pain EXAM: TRANSABDOMINAL AND TRANSVAGINAL ULTRASOUND OF PELVIS DOPPLER ULTRASOUND OF OVARIES TECHNIQUE: Both transabdominal and transvaginal ultrasound examinations of the pelvis were performed. Transabdominal technique was performed for global imaging of the pelvis including uterus, ovaries, adnexal regions, and pelvic cul-de-sac. It was necessary to proceed with endovaginal exam following the transabdominal exam to visualize the uterus, endometrium and RIGHT ovary/RIGHT and LEFT adnexa. Color and duplex Doppler ultrasound was utilized to evaluate blood flow to the ovaries. COMPARISON:  CT evaluation which was acquired on the same date. FINDINGS: Uterus Measurements: 8.1 x 3.3 x 4.2 cm = volume: 59.3 mL. No fibroids or other mass visualized. Endometrium Thickness: 4.2 mm.  No focal abnormality visualized. Right ovary Measurements: Not visualized due to overlying bowel gas. Left ovary Measurements: 2.4 x 1.7 x 1.6 cm = volume: 3.5 mL. Normal  appearance of the ovaries sonographically. The ovarian position is quite deep, obscured by bowel gas on the transabdominal images and quite high which makes assessment with Doppler difficult. Limited assessment due to high position of the bilateral ovaries on today's exam. RIGHT ovary is not seen. Spectral Doppler cannot be assessed on the LEFT likely due  to ovarian position. Other findings No abnormal free fluid. IMPRESSION: 1. High position of the ovaries obscured by overlying bowel gas with nonvisualization of the RIGHT ovary. LEFT ovary appears normal on grayscale images but cannot be assessed with Doppler or spectral imaging, likely due to technical factors. 2. Normal appearance of the uterus and endometrium. Electronically Signed   By: Donzetta KohutGeoffrey  Wile M.D.   On: 01/04/2020 14:15    Procedures Procedures (including critical care time)  Medications Ordered in ED Medications  ondansetron (ZOFRAN-ODT) disintegrating tablet 4 mg (has no administration in time range)  fentaNYL (SUBLIMAZE) injection 50 mcg (has no administration in time range)  ondansetron (ZOFRAN-ODT) disintegrating tablet 4 mg (4 mg Oral Given 01/04/20 1032)  sodium chloride 0.9 % bolus 1,000 mL (0 mLs Intravenous Stopped 01/04/20 1311)  ondansetron (ZOFRAN) injection 4 mg (4 mg Intravenous Given 01/04/20 1136)  morphine 4 MG/ML injection 4 mg (4 mg Intravenous Given 01/04/20 1136)  iohexol (OMNIPAQUE) 300 MG/ML solution 100 mL (100 mLs Intravenous Contrast Given 01/04/20 1149)  fentaNYL (SUBLIMAZE) injection 50 mcg (50 mcg Intravenous Given 01/04/20 1311)    ED Course  I have reviewed the triage vital signs and the nursing notes.  Pertinent labs & imaging results that were available during my care of the patient were reviewed by me and considered in my medical decision making (see chart for details).  Clinical Course as of Jan 04 1624  Wed Jan 04, 2020  1134 Pt reports pain is very much improved. Discussed U/S. Will proceed with pelvic  exam   [JR]  1523 Discussed with Dr. Shawnie PonsPratt with GYN, She reviewed imaging today. Suggests very unlikely torsion   [JR]    Clinical Course User Index [JR] Ebonique Hallstrom, [place] N, PA-C   MDM Rules/Calculators/A&P                          Patient presenting with right lower quadrant abdominal pain, with associated nausea, vomiting, diarrhea began last night.,  With associated nausea, vomiting, diarrhea that began last night.  On exam, vital signs are stable, she is afebrile.  Abdomen is soft with focal tenderness to the right lower quadrant.  Labs and imaging ordered to evaluate for acute appendicitis.  IV fluids, antiemetics and pain medicine also ordered.  Labs are reassuring, no leukocytosis or electrolyte derangement.  Normal renal and hepatic function, lipase within normal limits, UA negative, pregnancy negative.  CT scan is negative for appendicitis or other acute intra-abdominal/intra-pelvic findings. Followed with pelvic U/S to rule out torsion, however unable to visualize the right ovary. Pelvic exam with slight right adnexal TTP, though pt's symptoms have overall improved with interventions. Consulted with GYN on call Dr. Shawnie PonsPratt, she reviewed imaging and workup today, recommends unlikely torsion given normal CT, WBC, afebrile. Pt will be instructed to follow closely outpatient for recheck tomorrow, instructed to return to the ED sooner if symptoms persist or worsen in any way with possibility of early appendicitis. Pt verbalized understanding. Pt tolerating PO prior to discharge.   Pt discussed with Dr. Bernette MayersSheldon, agrees with workup and care plan at this time. Final Clinical Impression(s) / ED Diagnoses Final diagnoses:  Right lower quadrant abdominal pain  Nausea vomiting and diarrhea    Rx / DC Orders ED Discharge Orders         Ordered    ondansetron (ZOFRAN ODT) 4 MG disintegrating tablet  Every 8 hours PRN     Discontinue  Reprint  01/04/20 1608           Annaston Upham, [place] N,  PA-C 01/04/20 1719    Pollyann Savoy, MD 01/05/20 1059

## 2020-01-05 LAB — GC/CHLAMYDIA PROBE AMP (~~LOC~~) NOT AT ARMC
Chlamydia: NEGATIVE
Comment: NEGATIVE
Comment: NORMAL
Neisseria Gonorrhea: NEGATIVE

## 2020-01-11 ENCOUNTER — Other Ambulatory Visit: Payer: Self-pay

## 2020-01-11 ENCOUNTER — Ambulatory Visit (HOSPITAL_COMMUNITY)
Admission: EM | Admit: 2020-01-11 | Discharge: 2020-01-11 | Disposition: A | Discharge status: Home / Self Care | Payer: PRIVATE HEALTH INSURANCE | Attending: Psychiatry | Admitting: Psychiatry

## 2020-01-11 DIAGNOSIS — F3130 Bipolar disorder, current episode depressed, mild or moderate severity, unspecified: Secondary | ICD-10-CM | POA: Insufficient documentation

## 2020-01-11 MED ORDER — CARIPRAZINE HCL 1.5 MG PO CAPS
1.5000 mg | ORAL_CAPSULE | Freq: Every day | ORAL | Status: DC
  Filled 2020-01-11: qty 7

## 2020-01-11 NOTE — BH Assessment (Signed)
Comprehensive Clinical Assessment (CCA) Note  01/11/2020 Erin Flores [id number]  Visit Diagnosis:  Bipolar I Disorder, Depressed   CCA Screening, Triage and Referral (STR)  Patient Reported Information How did you hear about [place]? Family/Friend  Referral name: No data recorded Referral phone number: No data recorded  Whom do you see for routine medical problems? Primary Care  Practice/Facility Name: No data recorded Practice/Facility Phone Number: No data recorded Name of Contact: No data recorded Contact Number: No data recorded Contact Fax Number: No data recorded Prescriber Name: No data recorded Prescriber Address (if known): No data recorded  What Is the Reason for Your Visit/Call Today? No data recorded How Long Has This Been Causing You Problems? 1 wk - 1 month  What Do You Feel Would Help You the Most Today? Assessment Only;Medication   Have You Recently Been in Any Inpatient Treatment (Hospital/Detox/Crisis Center/28-Day Program)? No  Name/Location of Program/Hospital:No data recorded How Long Were You There? No data recorded When Were You Discharged? No data recorded  Have You Ever Received Services From [facility] Before? No  Who Do You See at [facility]? No data recorded  Have You Recently Had Any Thoughts About Hurting Yourself? Yes  Are You Planning to Commit Suicide/Harm Yourself At This time? No   Have you Recently Had Thoughts About Hurting Someone Erin Flores? No  Explanation: No data recorded  Have You Used Any Alcohol or Drugs in the Past 24 Hours? No  How Long Ago Did You Use Drugs or Alcohol? No data recorded What Did You Use and How Much? No data recorded  Do You Currently Have a Therapist/Psychiatrist? Yes  Name of Therapist/Psychiatrist: [facility] -- psychiatrist is Erin Flores   Have You Been Recently Discharged From Any Office Practice or Programs? No  Explanation of Discharge From Practice/Program: No  data recorded    CCA Screening Triage Referral Assessment Type of Contact: Face-to-Face  Is this Initial or Reassessment? No data recorded Date Telepsych consult ordered in [facility]:  No data recorded Time Telepsych consult ordered in [facility]:  No data recorded  Patient Reported Information Reviewed? Yes  Patient Left Without Being Seen? No data recorded Reason for Not Completing Assessment: No data recorded  Collateral Involvement: No data recorded  Does Patient Have a Court Appointed Legal Guardian? No data recorded Name and Contact of Legal Guardian: No data recorded If Minor and Not Living with Parent(s), Who has Custody? No data recorded Is CPS involved or ever been involved? Never  Is APS involved or ever been involved? Never   Patient Determined To Be At Risk for Harm To Self or Others Based on Review of Patient Reported Information or Presenting Complaint? No  Method: No data recorded Availability of Means: No data recorded Intent: No data recorded Notification Required: No data recorded Additional Information for Danger to Others Potential: No data recorded Additional Comments for Danger to Others Potential: No data recorded Are There Guns or Other Weapons in Your Home? No data recorded Types of Guns/Weapons: No data recorded Are These Weapons Safely Secured?                            No data recorded Who Could Verify You Are Able To Have These Secured: No data recorded Do You Have any Outstanding Charges, Pending Court Dates, Parole/Probation? No data recorded Contacted To Inform of Risk of Harm To Self or Others: No data recorded  Location of Assessment: [facility] Services   Does Patient Present under Involuntary Commitment? No  IVC Papers Initial File Date: No data recorded  [place] of Residence: [place]   Patient Currently Receiving the Following Services: Medication Management;Individual Therapy   Determination of Need: No data recorded  Options  For Referral: Medication Management;Outpatient Therapy     CCA Biopsychosocial  Intake/Chief Complaint:  CCA Intake With Chief Complaint CCA Part Two Date: 01/11/20 Chief Complaint/Presenting Problem: Tearfulness; ''feeling strange'' -- Pt requested medication change Patient's Currently Reported Symptoms/Problems: Tearfulness; despondency; urge to self-harm; off medication Type of Services Patient Feels Are Needed: Pt requested new medication  Pt is a 34 year old female who presented to [facility] on voluntary basis with complaint of despondency and disturbed sleep due to issues with medication.  Pt lives in [place], and she is employed at [employer].  Pt receives outpatient psychiatric services through [facility] where she is treated for Bipolar I.  Pt reported that recently she spoke with her psychiatrist about changing her medication since she felt that the medication regimen she was following -- including Depakote, Buspar, Trazodone, Cymbalta -- was not helping.  Psychiatrist agreed to stop those medications and begin a new medication (Vraylar).  Pt was to have begun medication, but there has been difficulty in processing the new medication request.  Pt has been off of medication for several days.  As a result, Pt has experienced despondency, disturbed sleep, tearfulness, and the desire to self-harm.  Pt denied suicidal or homicidal ideation, hallucination, and substance use concerns.    With Pt's permission, Erin Flores spoke with Pt's husband Erin Flores.  Mr. Erin Flores stated that he would be comfortable with Pt returning to the family home; he did not have concerns about her safety.  Mental Health Symptoms Depression:  Depression: Duration of symptoms less than two weeks, Tearfulness, Sleep (too much or little), Change in energy/activity  Mania:  Mania: None  Anxiety:   Anxiety: None  Psychosis:  Psychosis: None  Trauma:  Trauma: None  Obsessions:  Obsessions:  None  Compulsions:  Compulsions: None  Inattention:  Inattention: None  Hyperactivity/Impulsivity:  Hyperactivity/Impulsivity: N/A  Oppositional/Defiant Behaviors:  Oppositional/Defiant Behaviors: None  Emotional Irregularity:  Emotional Irregularity: None  Other Mood/Personality Symptoms:      Mental Status Exam Appearance and self-care  Stature:  Stature: Average  Weight:  Weight: Average weight  Clothing:  Clothing: Casual  Grooming:  Grooming: Normal  Cosmetic use:  Cosmetic Use: Age appropriate  Posture/gait:  Posture/Gait: Normal  Motor activity:     Sensorium  Attention:  Attention: Normal  Concentration:  Concentration: Normal  Orientation:  Orientation: X5  Recall/memory:  Recall/Memory: Normal  Affect and Mood  Affect:  Affect: Blunted  Mood:  Mood: Depressed  Relating  Eye contact:  Eye Contact: Fleeting  Facial expression:  Facial Expression: Constricted  Attitude toward examiner:  Attitude Toward Examiner: Cooperative  Thought and Language  Speech flow: Speech Flow: Clear and Coherent  Thought content:  Thought Content: Appropriate to Mood and Circumstances  Preoccupation:  Preoccupations: None  Hallucinations:  Hallucinations: None  Organization:     Company secretary of Knowledge:  Fund of Knowledge: Average  Intelligence:  Intelligence: Average  Abstraction:  Abstraction: Normal  Judgement:  Judgement: Common-sensical  Reality Testing:  Reality Testing: Realistic  Insight:  Insight: Fair  Decision Making:  Decision Making: Normal  Social Functioning  Social Maturity:  Social Maturity: Responsible  Social Judgement:  Social  Judgement: Normal  Stress  Stressors:  Stressors: Other (Comment) (change in medication)  Coping Ability:  Coping Ability: Exhausted  Skill Deficits:  Skill Deficits: None  Supports:  Supports: Family     Religion:    Leisure/Recreation:    Exercise/Diet: Exercise/Diet Do You Have Any Trouble Sleeping?:  Yes Explanation of Sleeping Difficulties: slept four hours last   CCA Employment/Education  Employment/Work Situation: Employment / Work Situation Employment situation: Employed Where is patient currently employed?: [employer] Patient's job has been impacted by current illness: No Has patient ever been in the [employer]?: No  Education: Education Is Patient Currently Attending School?: No Did Garment/textile technologist From [employer]?: Yes Did Theme park manager?: Yes Did Designer, television/film set?: Yes   CCA Family/Childhood History  Family and Relationship History: Family history Marital status: Married What types of issues is patient dealing with in the relationship?: good support Are you sexually active?: Yes What is your sexual orientation?: heterosexual Does patient have children?: Yes How many children?: 2 How is patient's relationship with their children?: good  Childhood History:     Child/Adolescent Assessment:     CCA Substance Use  Alcohol/Drug Use: Alcohol / Drug Use Pain Medications: See MAR Prescriptions: See MAR Over the Counter: See MAR History of alcohol / drug use?: No history of alcohol / drug abuse                         ASAM's:  Six Dimensions of Multidimensional Assessment  Dimension 1:  Acute Intoxication and/or Withdrawal Potential:      Dimension 2:  Biomedical Conditions and Complications:      Dimension 3:  Emotional, Behavioral, or Cognitive Conditions and Complications:     Dimension 4:  Readiness to Change:     Dimension 5:  Relapse, Continued use, or Continued Problem Potential:     Dimension 6:  Recovery/Living Environment:     ASAM Severity Score:    ASAM Recommended Level of Treatment:     Substance use Disorder (SUD)    Recommendations for Services/Supports/Treatments:    DSM5 Diagnoses: Patient Active Problem List   Diagnosis Date Noted  . Bipolar I disorder, most recent episode depressed  (HCC)   . Encounter for surveillance of Nexplanon subdermal contraceptive 11/05/2017  . Bipolar affective, manic (HCC) 11/05/2017  . Biliary dyskinesia 08/16/2013    Patient Centered Plan: Patient is on the following Treatment Plan(s):     Referrals to Alternative Service(s): Referred to Alternative Service(s):   Place:   Date:   Time:    Referred to Alternative Service(s):   Place:   Date:   Time:    Referred to Alternative Service(s):   Place:   Date:   Time:    Referred to Alternative Service(s):   Place:   Date:   Time:     MSE:  During assessment, Pt presented as alert and oriented.  She had poor eye contact (looked down).  Demeanor was calm and cooperative.  Pt's speech was normal in rate, rhythm, and volume.  Thought processes were within normal range, and thought content was logical and goal-oriented.  There was no evidence of delusion.  Memory and concentration were intact.  Insight, judgment, and impulse control were fair.  DISPOSITION:  Pt was also assessed by Burnetta Sabin, NP.  Pt is to be discharged with sample of medication. Dorris Fetch Treniece [place]cl[place]

## 2020-01-11 NOTE — ED Notes (Signed)
Patient belongings in locker 31 

## 2020-01-11 NOTE — ED Provider Notes (Signed)
Behavioral Health Medical Screening Exam  Erin Flores is a 34 y.o. female.  Patient presents as a walk-in to the [facility] downtrending.  Patient reports that she was evaluated because she goes to [place] psychiatric services as well last week she was not feeling very well was continued on her Depakote, Cymbalta, trazodone, BuSpar, and Adderall.  She states they were going to start her on Vraylar 1.5 mg p.o. daily but there has to be a prior authorization done and the pharmacy did not fill it.  She states that she is continued to feel bad and she has been diagnosed with bipolar manic depressive disorder  When asked about having suicidal ideations the patient reports that the other day she stopped on the highway and crime to make the guardrail and, shop, and attempted to cut herself.  She states that she does not want to die and she does not want to kill herself.  She reports a previous attempt of self injurious behavior and using a scalpel and cutting her arm 4 times and states that this was not an attempt to kill herself.  She states she lives with her husband and 2 kids which is a 28 year old daughter and 70-year-old son.  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  She reports that she does not feel that she needs to be hospitalized but feels that she needs to get back on her medications.  Feeling she presented discontinuation to 6/64/9.  [employer], which is his phone number for his employment.  I will release him.  She also provided me consent to contact Merlinda Frederick, NP at [facility]. Patient was also provided a work note to return to work on 01/16/20 to allow for stress reduction and medications to be started. She reports and confirms the patient's reported story.  She states that they have been having issues with getting prior authorization but they are currently working on it.  She states that they are trying to get the patient some samples but their  offices has not been open due to staffing issues.  Discussed with pharmacy and they stated they can provide the patient with a 7-day sample of Vraylar 1.5 mg.  Patient is in agreement with the assessment was given psychiatric Associates.  They will be notified of the symptoms provided to the patient.  Melissa Noon NP also stated that the patient will also has an appointment with [name] her psychiatrist tomorrow. TTS counselor contacted patient's husband and he stated he had no safety concerns with the patient being discharged home with her medications.  Total Time spent with patient: 30 minutes  Psychiatric Specialty Exam  Presentation  General Appearance:Appropriate for Environment;Casual;Fairly Groomed  Eye Contact:Good  Speech:Clear and Coherent;Normal Rate  Speech Volume:Normal  Handedness:Right   Mood and Affect  Mood:Depressed  Affect:Congruent   Thought Process  Thought Processes:Coherent  Descriptions of Associations:Intact  Orientation:Full (Time, Place and Person)  Thought Content:WDL  Hallucinations:None  Ideas of Reference:None  Suicidal Thoughts:No  Homicidal Thoughts:No   Sensorium  Memory:Immediate Good;Recent Good;Remote Good  Judgment:Good  Insight:Good   Executive Functions  Concentration:Good  Attention Span:Good  Recall:Good  Fund of Knowledge:Good  Language:Good   Psychomotor Activity  Psychomotor Activity:Normal   Assets  Assets:Communication Skills;Desire for Improvement;Financial Resources/Insurance;Housing;Physical Health;Social Support;Transportation   Sleep  Sleep:Fair  Number of hours: No data recorded  Physical Exam: Physical Exam Vitals and nursing note reviewed.  Constitutional:      Appearance: She is  well-developed.  Cardiovascular:     Rate and Rhythm: Normal rate.  Pulmonary:     Effort: Pulmonary effort is normal.  Musculoskeletal:        General: Normal range of motion.  Skin:     General: Skin is warm.  Neurological:     Mental Status: She is alert and oriented to person, place, and time.  Psychiatric:        Mood and Affect: Mood is depressed.    Review of Systems  Constitutional: Negative.   HENT: Negative.   Eyes: Negative.   Respiratory: Negative.   Cardiovascular: Negative.   Gastrointestinal: Negative.   Genitourinary: Negative.   Musculoskeletal: Negative.   Skin: Negative.   Neurological: Negative.   Endo/Heme/Allergies: Negative.   Psychiatric/Behavioral: Positive for depression.   Blood pressure (!) 144/94, pulse 100, temperature (!) 97.1 F (36.2 C), temperature source Temporal, resp. rate 18, SpO2 99 %. There is no height or weight on file to calculate BMI.  Musculoskeletal: Strength & Muscle Tone: within normal limits Gait & Station: normal Patient leans: N/A   Recommendations:  Based on my evaluation the patient does not appear to have an emergency medical condition.  Gerlene Burdock Jourdain Gu[place], FNP 01/11/2020, 3:06 PM

## 2020-01-11 NOTE — Discharge Instructions (Addendum)
Keep scheduled appointments Take medications as prescribed 

## 2020-01-11 NOTE — ED Notes (Signed)
Patient discharged home. AVS reviewed and written copy given to patient and she verbalized understanding. Patient given medication samples and note for work. Patient denies SI/HI at discharged. Patient escorted to lobby to call friend to pick her up.

## 2020-02-16 ENCOUNTER — Other Ambulatory Visit: Payer: Self-pay

## 2020-02-16 ENCOUNTER — Encounter: Payer: Self-pay | Admitting: Family Medicine

## 2020-02-16 ENCOUNTER — Ambulatory Visit: Payer: PRIVATE HEALTH INSURANCE | Admitting: Family Medicine

## 2020-02-16 VITALS — BP 136/85 | HR 99 | Ht 63.0 in | Wt 222.0 lb

## 2020-02-16 DIAGNOSIS — I10 Essential (primary) hypertension: Secondary | ICD-10-CM | POA: Diagnosis not present

## 2020-02-16 DIAGNOSIS — Z23 Encounter for immunization: Secondary | ICD-10-CM | POA: Diagnosis not present

## 2020-02-16 MED ORDER — IRBESARTAN 75 MG PO TABS
75.0000 mg | ORAL_TABLET | Freq: Every day | ORAL | 0 refills | Status: DC
Start: 2020-02-16 — End: 2020-03-02

## 2020-02-16 NOTE — Progress Notes (Signed)
OFFICE VISIT  02/16/2020  CC:  Chief Complaint  Patient presents with  . Hypertension    pt taken BP this weekend and was in the 160s with the machine in [employer]; and was 148 at the dr office yesterday;     HPI:    Patient is a 34 y.o. Caucasian female who presents for elevated blood pressure. BP high for a couple months.  Checks it at work a couple times a week and bps 160s/100 range frequently.  Occ measurment in 130s. 5 d/a at [employer] bp 164/11.  Next few days over 140s/90s. No pain or distress/anxiety during times of these bp checks.  Has not been rx'd any antihypertensive in the past  No sudafed. No caffeine last 2-3 wks. Drinks lots of water.  Goes to the gym regularly and does cardio and wt lifting. She estimates normal sodium intake.  Started VRAYLAR, a new atypical antipsychotic.  Has been on adderall since March this year.    Review of BPs in EMR since 2013 show the large majority in normal range, with 4-5 in mildly hypertensive range.  Not uncommon for her HR to be 90s-low 100s.  12/23/19 lower ext venous dopplers in ED were NORMAL. No hx of echocardiogram being done (none in our EMR).  Past Medical History:  Diagnosis Date  . Anxiety and depression   . Bilateral lower extremity edema 2020/2021  . Bipolar affective disorder [facility])    question of:  pt saw psychiatrist and was on lamictal and tegretol aroun 2018; takes clonoprin 2mg  prn for social anxiety.  he majory sx's were in the context of signif psychsocial probs with the father of her daughter.  GERD (gastroesophageal reflux disease)   . Herpes zoster 2017  . History of iron deficiency anemia   . History of pyelonephritis 2018  . Migraines    "I take a lot of ibuprofen".  Worse on OCPs.  Gets 8 tension HA's a month and 1-2 migraines per month.  . Psoriatic arthritis (HCC) 2014 dx   Enbrel 2020. Simponi 2021->?htn and edema rx?->stopped by Dr. 2021 12/2019.    Past Surgical History:  Procedure  Laterality Date  . 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 Procedure: LAPAROSCOPIC CHOLECYSTECTOMY WITH INTRAOPERATIVE CHOLANGIOGRAM;  Surgeon: 08/21/2013, MD;  Location: [facility] ORS;  Service: General;  Laterality: N/A;    Outpatient Medications Prior to Visit  Medication Sig Dispense Refill  . amphetamine-dextroamphetamine (ADDERALL) 20 MG tablet Take 20 mg by mouth in the morning, at noon, and at bedtime.     . cariprazine (VRAYLAR) capsule Take by mouth daily.    Velora Heckler gabapentin (NEURONTIN) 100 MG capsule Take 100 mg by mouth 2 (two) times daily.    . zaleplon (SONATA) 10 MG capsule Take 10 mg by mouth at bedtime as needed for sleep.    Marland Kitchen sertraline (ZOLOFT) 100 MG tablet Take 200 mg by mouth daily.    Marland Kitchen etonogestrel (NEXPLANON) 68 MG IMPL implant Inject 68 mg into the skin once.  (Patient not taking: Reported on 02/16/2020)    . furosemide (LASIX) 20 MG tablet 1 tab po qd prn swelling (Patient not taking: Reported on 02/16/2020) 30 tablet 1  . hydrOXYzine (VISTARIL) 50 MG capsule Take 50 mg by mouth 2 (two) times daily as needed.    . ondansetron (ZOFRAN ODT) 4 MG disintegrating tablet Take 1 tablet (4 mg total) by mouth every 8 (eight) hours as needed for nausea or vomiting. (Patient not taking:  Reported on 02/16/2020) 20 tablet 0  . sertraline (ZOLOFT) 50 MG tablet Take by mouth.    . busPIRone (BUSPAR) 15 MG tablet Take 15 mg by mouth 2 (two) times daily. (Patient not taking: Reported on 02/16/2020)    . LORazepam (ATIVAN) 0.5 MG tablet Take 0.5 mg by mouth 3 (three) times daily as needed for anxiety. (Patient not taking: Reported on 02/16/2020)    . traZODone (DESYREL) 150 MG tablet TAKE 1 2 (ONE HALF) TABLET BY MOUTH AT BEDTIME AS NEEDED FOR SLEEP (Patient not taking: Reported on 01/04/2020)     No facility-administered medications prior to visit.    Allergies  Allergen Reactions  . Dilaudid [Hydromorphone Hcl] Other (See Comments)    Issues with urination  . Simponi  [Golimumab] Hypertension    ? HTN and LE edema    ROS As per HPI  PE: Vitals with BMI 02/16/2020 01/04/2020 01/04/2020  Height 5\' 3"  - -  Weight 222 lbs - -  BMI 39.34 - -  Systolic 136 118  Diastolic 85 78 78  Pulse 99 101 93  Some encounter information is confidential and restricted. Go to Review Flowsheets activity to see all data.  O2 sat 100% on RA today   Exam chaperoned by female CMA. Gen: Alert, well appearing.  Patient is oriented to person, place, time, and situation. AFFECT: pleasant, lucid thought and speech. 063: no injection, icteris, swelling, or exudate.  EOMI, PERRLA. Mouth: lips without lesion/swelling.  Oral mucosa pink and moist. Oropharynx without erythema, exudate, or swelling.  Neck - No masses or thyromegaly or limitation in range of motion.  No bruits.  Carotids 2+. CV: RRR, no m/r/g.   LUNGS: CTA bilat, nonlabored resps, good aeration in all lung fields. ABD: soft, NT, ND, BS normal.  No hepatospenomegaly or mass.  No bruits. EXT: no clubbing or cyanosis.  NO edema.    LABS:    Chemistry      Component Value Date/Time   NA 139 01/04/2020 1132   NA 143 08/06/2018 0000   K 3.9 01/04/2020 1132   CL 101 01/04/2020 1132   CO2 25 01/04/2020 1132   BUN 13 01/04/2020 1132   BUN 8 08/06/2018 0000   CREATININE 0.79 01/04/2020 1132   GLU 78 08/06/2018 0000      Component Value Date/Time   CALCIUM 8.6 (L) 01/04/2020 1132   ALKPHOS 67 01/04/2020 1132   AST 31 01/04/2020 1132   ALT 41 01/04/2020 1132   BILITOT 0.1 (L) 01/04/2020 1132     Lab Results  Component Value Date   TSH 2.91 10/26/2019   Lab Results  Component Value Date   WBC 9.9 01/04/2020   HGB 13.1 01/04/2020   HCT 40.5 01/04/2020   MCV 83.0 01/04/2020   PLT 276 01/04/2020   No results found for: CHOL, HDL, LDLCALC, LDLDIRECT, TRIG, CHOLHDL No results found for: 03/05/2020   IMPRESSION AND PLAN:  Essential HTN: has had this for a while likely but officially new dx  today. Start irbesartan 75 mg qd. Electrolytes and kidney function normal 01/04/20.  CBC and TSH normal w/in the last 4 mo.  Reviewed EMR and found no elevated glucoses and she has not had a Hba1c.  She does not smoke. Will have her return for fasting lipids and BMET after she has been on irbesartan for 1 wk. Check bp and HR once daily and we'll review these at office f/u in 2 wks.  Of note, she has  no leg edema at all today.  Says she is not taking any lasix and says she does not have any lasix anymore.  I did not rx anymore lasix today.  An After Visit Summary was printed and given to the patient.  FOLLOW UP: Return in about 2 weeks (around 03/01/2020) for f/u HTN.  Also needs fasting lab appt at her earliest convenience.  Signed:  Santiago Bumpers, MD           02/16/2020

## 2020-02-24 ENCOUNTER — Ambulatory Visit (INDEPENDENT_AMBULATORY_CARE_PROVIDER_SITE_OTHER): Payer: PRIVATE HEALTH INSURANCE

## 2020-02-24 ENCOUNTER — Other Ambulatory Visit: Payer: Self-pay

## 2020-02-24 ENCOUNTER — Telehealth: Payer: Self-pay

## 2020-02-24 DIAGNOSIS — I10 Essential (primary) hypertension: Secondary | ICD-10-CM | POA: Diagnosis not present

## 2020-02-24 DIAGNOSIS — Z79899 Other long term (current) drug therapy: Secondary | ICD-10-CM

## 2020-02-24 DIAGNOSIS — F319 Bipolar disorder, unspecified: Secondary | ICD-10-CM

## 2020-02-24 LAB — LIPID PANEL
Cholesterol: 213 mg/dL — ABNORMAL HIGH (ref 0–200)
HDL: 59.4 mg/dL (ref >39.00)
LDL Cholesterol: 117 mg/dL — ABNORMAL HIGH (ref 0–99)
NonHDL: 153.95
Total CHOL/HDL Ratio: 4
Triglycerides: 185 mg/dL — ABNORMAL HIGH (ref 0.0–149.0)
VLDL: 37 mg/dL (ref 0.0–40.0)

## 2020-02-24 LAB — CBC
HCT: 37.9 % (ref 36.0–46.0)
Hemoglobin: 12.5 g/dL (ref 12.0–15.0)
MCHC: 32.9 g/dL (ref 30.0–36.0)
MCV: 79.7 fl (ref 78.0–100.0)
Platelets: 336 10*3/uL (ref 150.0–400.0)
RBC: 4.76 Mil/uL (ref 3.87–5.11)
RDW: 15.1 % (ref 11.5–15.5)
WBC: 11.2 10*3/uL — ABNORMAL HIGH (ref 4.0–10.5)

## 2020-02-24 LAB — BASIC METABOLIC PANEL
BUN: 12 mg/dL (ref 6–23)
CO2: 27 mEq/L (ref 19–32)
Calcium: 9.2 mg/dL (ref 8.4–10.5)
Chloride: 101 mEq/L (ref 96–112)
Creatinine, Ser: 0.75 mg/dL (ref 0.40–1.20)
GFR: 87.32 mL/min (ref >60.00)
Glucose, Bld: 82 mg/dL (ref 70–99)
Potassium: 4.3 mEq/L (ref 3.5–5.1)
Sodium: 138 mEq/L (ref 135–145)

## 2020-02-24 LAB — TSH: TSH: 3.35 u[IU]/mL (ref 0.35–4.50)

## 2020-02-24 NOTE — Addendum Note (Signed)
Addended by: Paschal Dopp on: 02/24/2020 01:50 PM   Modules accepted: Orders

## 2020-02-24 NOTE — Progress Notes (Addendum)
Psych needs TSH and CBC. Pt stated that she spoke with someone yesterday and they told her that they would put it in a note. No note found but labs have been drawn in case we are able to have them done.  [name] is the psych dr: phone [phone]

## 2020-02-24 NOTE — Telephone Encounter (Signed)
No further action needed.

## 2020-02-24 NOTE — Telephone Encounter (Signed)
OK, orders signed.  When results come in I'll send message to you to forward results on to her psychiatrist.-thx

## 2020-02-24 NOTE — Telephone Encounter (Signed)
Psych needs TSH and CBC. Pt stated that she spoke with someone yesterday and they told her that they would put it in a note. No note found but labs have been drawn in case we are able to have them done.  [name] is the psych dr: phone [phone]  Orders pending

## 2020-02-25 NOTE — Progress Notes (Signed)
Pt aware of lab results. Faxed results as requested

## 2020-03-01 ENCOUNTER — Other Ambulatory Visit: Payer: Self-pay

## 2020-03-02 ENCOUNTER — Ambulatory Visit (INDEPENDENT_AMBULATORY_CARE_PROVIDER_SITE_OTHER): Payer: PRIVATE HEALTH INSURANCE | Admitting: Family Medicine

## 2020-03-02 ENCOUNTER — Encounter: Payer: Self-pay | Admitting: Family Medicine

## 2020-03-02 ENCOUNTER — Other Ambulatory Visit: Payer: Self-pay

## 2020-03-02 VITALS — BP 133/88 | HR 104 | Temp 97.1°F | Resp 18 | Ht 63.0 in | Wt 228.0 lb

## 2020-03-02 DIAGNOSIS — I10 Essential (primary) hypertension: Secondary | ICD-10-CM

## 2020-03-02 MED ORDER — IRBESARTAN 150 MG PO TABS
150.0000 mg | ORAL_TABLET | Freq: Every day | ORAL | 0 refills | Status: DC
Start: 2020-03-02 — End: 2021-01-07

## 2020-03-02 NOTE — Progress Notes (Signed)
OFFICE VISIT  03/02/2020  CC:  Chief Complaint  Patient presents with  . Hypertension    headaches , good at home readings  . Contraception    HPI:    Patient is a 34 y.o. Caucasian female who presents for 2 wks f/u HTN. A/P as of last visit: "Essential HTN: has had this for a while likely but officially new dx today. Start irbesartan 75 mg qd. Electrolytes and kidney function normal 01/04/20.  CBC and TSH normal w/in the last 4 mo.  Reviewed EMR and found no elevated glucoses and she has not had a Hba1c.  She does not smoke. Will have her return for fasting lipids and BMET after she has been on irbesartan for 1 wk. Check bp and HR once daily and we'll review these at office f/u in 2 wks.  Of note, she has no leg edema at all today.  Says she is not taking any lasix and says she does not have any lasix anymore.  I did not rx anymore lasix today."   INTERIM HX: Electrolytes and kidney function all normal 1 wk after starting the irbesartan. Doing ok.  Has had some HAs since getting on the med---back of head to temples.  Checks bp when it occurs and not particularly elevated.  Has approx 3 per week, mild/mod intensity.  She has also recently started pristiq prior to onset of her headaches.  Has appt with her psych md in 1 hr. BP 110/70 yesterday at rheum MD. At work her bp's avg 135/85. Working on better diet->"hello fresh". Trying to get back into exercise routine.  Rheum MD yesterday: says she is doing well with this.  She'll be starting a monthly injection.  She is currently on no meds for this. She had her nexplanon taken out a couple months ago after a couple years of using it. Says she took it out b/c she felt like it was affecting her losing wt. She has OB/GYN at [facility]. She has not had a menses since stopping her nexplanon.   Past Medical History:  Diagnosis Date  . Anxiety and depression   . Bilateral lower extremity edema 2020/2021  . Bipolar affective  disorder [facility])    question of:  pt saw psychiatrist and was on lamictal and tegretol aroun 2018; takes clonoprin 2mg  prn for social anxiety.  he majory sx's were in the context of signif psychsocial probs with the father of her daughter.  GERD (gastroesophageal reflux disease)   . Herpes zoster 2017  . History of iron deficiency anemia   . History of pyelonephritis 2018  . Migraines    "I take a lot of ibuprofen".  Worse on OCPs.  Gets 8 tension HA's a month and 1-2 migraines per month.  . Psoriatic arthritis (HCC) 2014 dx   Enbrel 2020. Simponi 2021->?htn and edema rx?->stopped by Dr. 2021 12/2019.    Past Surgical History:  Procedure Laterality Date  . 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 Procedure: LAPAROSCOPIC CHOLECYSTECTOMY WITH INTRAOPERATIVE CHOLANGIOGRAM;  Surgeon: 08/21/2013, MD;  Location: [facility];  Service: General;  Laterality: N/A;    Outpatient Medications Prior to Visit  Medication Sig Dispense Refill  . amphetamine-dextroamphetamine (ADDERALL) 20 MG tablet Take 20 mg by mouth in the morning, at noon, and at bedtime.     . cariprazine (VRAYLAR) capsule Take by mouth daily.    Velora Heckler Desvenlafaxine Succinate ER 25 MG TB24 Take by mouth.    . gabapentin (  NEURONTIN) 100 MG capsule Take 100 mg by mouth 2 (two) times daily.    . hydrOXYzine (VISTARIL) 50 MG capsule Take 50 mg by mouth 2 (two) times daily as needed.    . zaleplon (SONATA) 10 MG capsule Take 10 mg by mouth at bedtime as needed for sleep.    . irbesartan (AVAPRO) 75 MG tablet Take 1 tablet (75 mg total) by mouth daily. 30 tablet 0  . etonogestrel (NEXPLANON) 68 MG IMPL implant Inject 68 mg into the skin once.  (Patient not taking: Reported on 02/16/2020)    . furosemide (LASIX) 20 MG tablet 1 tab po qd prn swelling (Patient not taking: Reported on 02/16/2020) 30 tablet 1  . ondansetron (ZOFRAN ODT) 4 MG disintegrating tablet Take 1 tablet (4 mg total) by mouth every 8 (eight) hours as needed for  nausea or vomiting. (Patient not taking: Reported on 02/16/2020) 20 tablet 0  . sertraline (ZOLOFT) 50 MG tablet Take by mouth. (Patient not taking: Reported on 03/02/2020)     No facility-administered medications prior to visit.    Allergies  Allergen Reactions  . Dilaudid [Hydromorphone Hcl] Other (See Comments)    Issues with urination  . Simponi [Golimumab] Hypertension    ? HTN and LE edema    ROS As per HPI  PE: Vitals with BMI 03/02/2020 02/16/2020 01/04/2020  Height 5\' 3"  5\' 3"  -  Weight 228 lbs 222 lbs -  BMI 40.4 39.34 -  Systolic 133 136  Diastolic 88 85 78  Pulse 104 99 101  Some encounter information is confidential and restricted. Go to Review Flowsheets activity to see all data.     Gen: Alert, well appearing.  Patient is oriented to person, place, time, and situation. AFFECT: pleasant, lucid thought and speech. No further exam today.  LABS:    Chemistry      Component Value Date/Time   NA 138 02/24/2020 0852   NA 143 08/06/2018 0000   K 4.3 02/24/2020 0852   CL 101 02/24/2020 0852   CO2 27 02/24/2020 0852   BUN 12 02/24/2020 0852   BUN 8 08/06/2018 0000   CREATININE 0.75 02/24/2020 0852   GLU 78 08/06/2018 0000      Component Value Date/Time   CALCIUM 9.2 02/24/2020 0852   ALKPHOS 67 01/04/2020 1132   AST 31 01/04/2020 1132   ALT 41 01/04/2020 1132   BILITOT 0.1 (L) 01/04/2020 1132     Lab Results  Component Value Date   WBC 11.2 (H) 02/24/2020   HGB 12.5 02/24/2020   HCT 37.9 02/24/2020   MCV 79.7 02/24/2020   PLT 336.0 02/24/2020   Lab Results  Component Value Date   TSH 3.35 02/24/2020   Lab Results  Component Value Date   CHOL 213 (H) 02/24/2020   HDL 59.40 02/24/2020   LDLCALC 117 (H) 02/24/2020   TRIG 185.0 (H) 02/24/2020   CHOLHDL 4 02/24/2020    IMPRESSION AND PLAN:  Uncontrolled HTN: improving but not at goal of avg <130 over <80. Increase irbesartan to 150mg  qd. I don't think her headaches are coming from this med  but if they excalate on increased dose then we'll change medications. Continue with increased efforts at diet/exercise/wt loss and continue bp monitoring outside of medical office. No labs today.  She asked about me managing contraceptive for her but given all the factors involved (bp's, she took her nexplanon out 2 mo ago, has not had another menses yet) I recommended she  get this managed through her OB/GYN MD.  An After Visit Summary was printed and given to the patient.  FOLLOW UP: Return for f/u 3-4 wks HTN.  Signed:  Santiago Bumpers, MD           03/02/2020

## 2020-04-12 ENCOUNTER — Emergency Department (HOSPITAL_BASED_OUTPATIENT_CLINIC_OR_DEPARTMENT_OTHER): Payer: PRIVATE HEALTH INSURANCE

## 2020-04-12 ENCOUNTER — Other Ambulatory Visit: Payer: Self-pay

## 2020-04-12 ENCOUNTER — Other Ambulatory Visit (HOSPITAL_BASED_OUTPATIENT_CLINIC_OR_DEPARTMENT_OTHER): Payer: Self-pay | Admitting: Emergency Medicine

## 2020-04-12 ENCOUNTER — Emergency Department (HOSPITAL_BASED_OUTPATIENT_CLINIC_OR_DEPARTMENT_OTHER)
Admission: EM | Admit: 2020-04-12 | Discharge: 2020-04-12 | Disposition: A | Discharge status: Home / Self Care | Payer: PRIVATE HEALTH INSURANCE | Attending: Emergency Medicine | Admitting: Emergency Medicine

## 2020-04-12 ENCOUNTER — Encounter (HOSPITAL_BASED_OUTPATIENT_CLINIC_OR_DEPARTMENT_OTHER): Payer: Self-pay | Admitting: Emergency Medicine

## 2020-04-12 DIAGNOSIS — K219 Gastro-esophageal reflux disease without esophagitis: Secondary | ICD-10-CM | POA: Insufficient documentation

## 2020-04-12 DIAGNOSIS — Z79899 Other long term (current) drug therapy: Secondary | ICD-10-CM | POA: Diagnosis not present

## 2020-04-12 DIAGNOSIS — Z87891 Personal history of nicotine dependence: Secondary | ICD-10-CM | POA: Diagnosis not present

## 2020-04-12 DIAGNOSIS — R7309 Other abnormal glucose: Secondary | ICD-10-CM | POA: Diagnosis not present

## 2020-04-12 DIAGNOSIS — R109 Unspecified abdominal pain: Secondary | ICD-10-CM | POA: Insufficient documentation

## 2020-04-12 DIAGNOSIS — M545 Low back pain, unspecified: Secondary | ICD-10-CM

## 2020-04-12 DIAGNOSIS — R739 Hyperglycemia, unspecified: Secondary | ICD-10-CM

## 2020-04-12 LAB — URINALYSIS, MICROSCOPIC (REFLEX)

## 2020-04-12 LAB — URINALYSIS, ROUTINE W REFLEX MICROSCOPIC
Bilirubin Urine: NEGATIVE
Glucose, UA: NEGATIVE mg/dL
Ketones, ur: NEGATIVE mg/dL
Leukocytes,Ua: NEGATIVE
Nitrite: NEGATIVE
Protein, ur: NEGATIVE mg/dL
Specific Gravity, Urine: 1.025 (ref 1.005–1.030)
pH: 6 (ref 5.0–8.0)

## 2020-04-12 LAB — CBC WITH DIFFERENTIAL/PLATELET
Abs Immature Granulocytes: 0.09 10*3/uL — ABNORMAL HIGH (ref 0.00–0.07)
Basophils Absolute: 0 10*3/uL (ref 0.0–0.1)
Basophils Relative: 0 %
Eosinophils Absolute: 0 10*3/uL (ref 0.0–0.5)
Eosinophils Relative: 0 %
HCT: 40.3 % (ref 36.0–46.0)
Hemoglobin: 12.7 g/dL (ref 12.0–15.0)
Immature Granulocytes: 1 %
Lymphocytes Relative: 17 %
Lymphs Abs: 2.3 10*3/uL (ref 0.7–4.0)
MCH: 25.6 pg — ABNORMAL LOW (ref 26.0–34.0)
MCHC: 31.5 g/dL (ref 30.0–36.0)
MCV: 81.3 fL (ref 80.0–100.0)
Monocytes Absolute: 0.4 10*3/uL (ref 0.1–1.0)
Monocytes Relative: 3 %
Neutro Abs: 11 10*3/uL — ABNORMAL HIGH (ref 1.7–7.7)
Neutrophils Relative %: 79 %
Platelets: 382 10*3/uL (ref 150–400)
RBC: 4.96 MIL/uL (ref 3.87–5.11)
RDW: 14.1 % (ref 11.5–15.5)
WBC: 13.9 10*3/uL — ABNORMAL HIGH (ref 4.0–10.5)
nRBC: 0 % (ref 0.0–0.2)

## 2020-04-12 LAB — COMPREHENSIVE METABOLIC PANEL
ALT: 32 U/L (ref 0–44)
AST: 23 U/L (ref 15–41)
Albumin: 4.2 g/dL (ref 3.5–5.0)
Alkaline Phosphatase: 76 U/L (ref 38–126)
Anion gap: 13 (ref 5–15)
BUN: 14 mg/dL (ref 6–20)
CO2: 21 mmol/L — ABNORMAL LOW (ref 22–32)
Calcium: 8.9 mg/dL (ref 8.9–10.3)
Chloride: 102 mmol/L (ref 98–111)
Creatinine, Ser: 0.74 mg/dL (ref 0.44–1.00)
GFR, Estimated: >60 mL/min (ref >60)
Glucose, Bld: 163 mg/dL — ABNORMAL HIGH (ref 70–99)
Potassium: 3.8 mmol/L (ref 3.5–5.1)
Sodium: 136 mmol/L (ref 135–145)
Total Bilirubin: 0.4 mg/dL (ref 0.3–1.2)
Total Protein: 7.7 g/dL (ref 6.5–8.1)

## 2020-04-12 LAB — LIPASE, BLOOD: Lipase: 21 U/L (ref 11–51)

## 2020-04-12 LAB — PREGNANCY, URINE: Preg Test, Ur: NEGATIVE

## 2020-04-12 MED ORDER — HYDROCODONE-ACETAMINOPHEN 5-325 MG PO TABS
1.0000 | ORAL_TABLET | ORAL | 0 refills | Status: DC | PRN
Start: 2020-04-12 — End: 2020-04-12

## 2020-04-12 MED ORDER — SODIUM CHLORIDE 0.9 % IV BOLUS
500.0000 mL | Freq: Once | INTRAVENOUS | Status: AC
  Administered 2020-04-12: 500 mL via INTRAVENOUS

## 2020-04-12 MED ORDER — FENTANYL CITRATE (PF) 100 MCG/2ML IJ SOLN
50.0000 ug | Freq: Once | INTRAMUSCULAR | Status: AC
  Administered 2020-04-12: 50 ug via INTRAVENOUS
  Filled 2020-04-12: qty 2

## 2020-04-12 MED ORDER — ONDANSETRON HCL 4 MG/2ML IJ SOLN
4.0000 mg | Freq: Once | INTRAMUSCULAR | Status: AC
  Administered 2020-04-12: 4 mg via INTRAVENOUS
  Filled 2020-04-12: qty 2

## 2020-04-12 MED FILL — HYDROCODON-APAP 5-325: 5-325 | 2 days supply | Qty: 15 | Fill #0

## 2020-04-12 NOTE — ED Notes (Signed)
Called lab to add on CBC 

## 2020-04-12 NOTE — ED Notes (Signed)
Provided pt with drink for PO challenge

## 2020-04-12 NOTE — ED Triage Notes (Signed)
Right flank pain x 4 days , pain radiating to hip and right thigh. Denies urinary symptoms.

## 2020-04-12 NOTE — ED Notes (Signed)
Pt ambulatory to CT scan

## 2020-04-12 NOTE — ED Provider Notes (Signed)
[facility] EMERGENCY DEPARTMENT Provider Note   CSN: [id number] Arrival date & time: 04/12/20  3329     History Chief Complaint  Patient presents with  . Flank Pain    Erin Flores is a 34 y.o. female.  Patient is a 34 year old female with a history of bipolar disorder, GERD, migraines and psoriatic arthritis who presents with back pain.  She says she is having 2 different types of back pain.  1 she describes as kidney pain on her right side.  Its in her right mid back.  It radiates a little bit to her right upper flank area.  She has had some associated nausea but no vomiting.  She says that she has had kidney pain similar to that in the past and usually she takes ibuprofen for it and it improves but it has not been improving recently.  She denies any known fevers.  No change in bowels.  She denies any urinary symptoms.  The pain is not worse with eating.  She is status post cholecystectomy.  It is a little bit worse with movement and deep breathing although she denies any shortness of breath.  No unilateral leg swelling.  No history of VTE.  No history of recent immobilization.  She does have some pain to her right lower back as well that radiates down her right buttocks area and thigh.  There is no numbness or weakness to her extremities.  No loss of bowel or bladder control.        Past Medical History:  Diagnosis Date  . Anxiety and depression   . Bilateral lower extremity edema 2020/2021  . Bipolar affective disorder [facility])    question of:  pt saw psychiatrist and was on lamictal and tegretol aroun 2018; takes clonoprin 2mg  prn for social anxiety.  he majory sx's were in the context of signif psychsocial probs with the father of her daughter.  GERD (gastroesophageal reflux disease)   . Herpes zoster 2017  . History of iron deficiency anemia   . History of pyelonephritis 2018  . Migraines    "I take a lot of ibuprofen".  Worse on OCPs.  Gets 8 tension HA's a  month and 1-2 migraines per month.  . Psoriatic arthritis (HCC) 2014 dx   Enbrel 2020. Simponi 2021->?htn and edema rx?->stopped by Dr. 2021 12/2019.    Patient Active Problem List   Diagnosis Date Noted  . Bipolar I disorder, most recent episode depressed (HCC)   . Encounter for surveillance of Nexplanon subdermal contraceptive 11/05/2017  . Bipolar affective, manic (HCC) 11/05/2017  . Biliary dyskinesia 08/16/2013    Past Surgical History:  Procedure Laterality Date  . 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 Procedure: LAPAROSCOPIC CHOLECYSTECTOMY WITH INTRAOPERATIVE CHOLANGIOGRAM;  Surgeon: 08/21/2013, MD;  Location: [facility];  Service: General;  Laterality: N/A;     OB History   No obstetric history on file.     Family History  Problem Relation Age of Onset  . Alcohol abuse Mother   . Cervical cancer Mother   . Depression Mother   . Drug abuse Mother   . Hyperlipidemia Mother   . Hypertension Mother   . Learning disabilities Mother   . Bipolar disorder Mother   . Alcohol abuse Father   . Drug abuse Father   . Hyperlipidemia Father   . Hypertension Father   . Cancer Maternal Aunt        breast  . Alcohol  abuse Sister   . Depression Sister   . Drug abuse Sister   . Bipolar disorder Sister   . Depression Daughter   . Learning disabilities Daughter   . Alcohol abuse Maternal Grandmother   . Drug abuse Maternal Grandmother   . Early death Maternal Grandmother        MVA - drunk driving  . Alcohol abuse Maternal Grandfather   . Drug abuse Maternal Grandfather   . Early death Maternal Grandfather   . Alcohol abuse Paternal Grandfather   . Drug abuse Paternal Grandfather   . Early death Paternal Grandfather     Social History   Tobacco Use  . Smoking status: Former Smoker    Packs/day: 0.25    Years: 8.00    Pack years: 2.00    Types: Cigarettes    Quit date: 06/02/2006    Years since quitting: 13.8  . Smokeless tobacco: Never Used    Vaping Use  . Vaping Use: Never used  Substance Use Topics  . Alcohol use: Yes    Comment: occasionally  . Drug use: No    Home Medications Prior to Admission medications   Medication Sig Start Date End Date Taking? Authorizing Provider  amphetamine-dextroamphetamine (ADDERALL) 20 MG tablet Take 20 mg by mouth in the morning, at noon, and at bedtime.  09/19/19   [provider]  cariprazine (VRAYLAR) capsule Take by mouth daily.    [provider]  Desvenlafaxine Succinate ER 25 MG TB24 Take by mouth. 02/23/20   [provider]  etonogestrel (NEXPLANON) 68 MG IMPL implant Inject 68 mg into the skin once.  Patient not taking: Reported on 02/16/2020    [provider]  furosemide (LASIX) 20 MG tablet 1 tab po qd prn swelling Patient not taking: Reported on 02/16/2020 10/26/19   Jeoffrey Massed, MD  gabapentin (NEURONTIN) 100 MG capsule Take 100 mg by mouth 2 (two) times daily.    [provider]  HYDROcodone-acetaminophen (NORCO/VICODIN) 5-325 MG tablet Take 1-2 tablets by mouth every 4 (four) hours as needed. 04/12/20   Rolan Bucco, MD  hydrOXYzine (VISTARIL) 50 MG capsule Take 50 mg by mouth 2 (two) times daily as needed. 02/11/20   [provider]  irbesartan (AVAPRO) 150 MG tablet Take 1 tablet (150 mg total) by mouth daily. 03/02/20   McGowen, Maryjean Morn, MD  ondansetron (ZOFRAN ODT) 4 MG disintegrating tablet Take 1 tablet (4 mg total) by mouth every 8 (eight) hours as needed for nausea or vomiting. Patient not taking: Reported on 02/16/2020 01/04/20   Robinson, [place] N, PA-C  sertraline (ZOLOFT) 50 MG tablet Take by mouth. Patient not taking: Reported on 03/02/2020 02/10/20   [provider]  zaleplon (SONATA) 10 MG capsule Take 10 mg by mouth at bedtime as needed for sleep. 01/03/20   [provider]    Allergies    Dilaudid [hydromorphone hcl] and Simponi [golimumab]  Review of Systems   Review of Systems   Constitutional: Negative for chills, diaphoresis, fatigue and fever.  HENT: Negative for congestion, rhinorrhea and sneezing.   Eyes: Negative.   Respiratory: Negative for cough, chest tightness and shortness of breath.   Cardiovascular: Negative for chest pain and leg swelling.  Gastrointestinal: Positive for nausea. Negative for abdominal pain, blood in stool, diarrhea and vomiting.  Genitourinary: Positive for flank pain. Negative for difficulty urinating, frequency and hematuria.  Musculoskeletal: Positive for back pain. Negative for arthralgias.  Skin: Negative for rash.  Neurological: Negative  for dizziness, speech difficulty, weakness, numbness and headaches.    Physical Exam Updated Vital Signs BP 127/69   Pulse 94   Temp 97.9 F (36.6 C) (Oral)   Resp (!) 25 Comment: Pt in pain after ambulating to CT  LMP 04/07/2020   SpO2 100%   Physical Exam Constitutional:      Appearance: She is well-developed.  HENT:     Head: Normocephalic and atraumatic.  Eyes:     Pupils: Pupils are equal, round, and reactive to light.  Cardiovascular:     Rate and Rhythm: Normal rate and regular rhythm.     Heart sounds: Normal heart sounds.  Pulmonary:     Effort: Pulmonary effort is normal. No respiratory distress.     Breath sounds: Normal breath sounds. No wheezing or rales.  Chest:     Chest wall: No tenderness.  Abdominal:     General: Bowel sounds are normal.     Palpations: Abdomen is soft.     Tenderness: There is no abdominal tenderness. There is no guarding or rebound.  Musculoskeletal:        General: Normal range of motion.     Cervical back: Normal range of motion and neck supple.     Comments: Positive tenderness to the right mid back at the upper lumbar region.  It extends a little bit into the right upper flank and mild tenderness in the right upper quadrant.  There is also some tenderness to the right lower lumbar area just to the right paraspinal region.  Negative  straight leg raise bilaterally.  Patellar reflexes symmetric bilaterally.  She has normal sensation and motor function distally.  Lymphadenopathy:     Cervical: No cervical adenopathy.  Skin:    General: Skin is warm and dry.     Findings: No rash.  Neurological:     Mental Status: She is alert and oriented to person, place, and time.     ED Results / Procedures / Treatments   Labs (all labs ordered are listed, but only abnormal results are displayed) Labs Reviewed  COMPREHENSIVE METABOLIC PANEL - Abnormal; Notable for the following components:      Result Value   CO2 21 (*)    Glucose, Bld 163 (*)    All other components within normal limits  URINALYSIS, ROUTINE W REFLEX MICROSCOPIC - Abnormal; Notable for the following components:   APPearance HAZY (*)    Hgb urine dipstick MODERATE (*)    All other components within normal limits  URINALYSIS, MICROSCOPIC (REFLEX) - Abnormal; Notable for the following components:   Bacteria, UA FEW (*)    All other components within normal limits  CBC WITH DIFFERENTIAL/PLATELET - Abnormal; Notable for the following components:   WBC 13.9 (*)    MCH 25.6 (*)    Neutro Abs 11.0 (*)    Abs Immature Granulocytes 0.09 (*)    All other components within normal limits  LIPASE, BLOOD  PREGNANCY, URINE    EKG None  Radiology CT Renal Stone Study  Result Date: 04/12/2020 CLINICAL DATA:  Flank pain, kidney stone suspected EXAM: CT ABDOMEN AND PELVIS WITHOUT CONTRAST TECHNIQUE: Multidetector CT imaging of the abdomen and pelvis was performed following the standard protocol without IV contrast. COMPARISON:  01/04/2020 CT abdomen pelvis and prior. FINDINGS: Please note that lack of intravenous contrast limits evaluation of the viscera and vasculature. Lower chest: Subsegmental atelectasis. Hepatobiliary: No focal hepatic lesion. Hepatic steatosis. No biliary dilatation. Gallbladder is surgically absent. Pancreas:  No focal lesions or pancreatic ductal  dilatation. No surrounding inflammation. Spleen: Unremarkable. Adrenals/Urinary Tract: Adrenal glands are unremarkable. No focal renal lesion or calculi. No hydronephrosis. Bladder is unremarkable. Stomach/Bowel: Stomach is within normal limits. Appendix appears normal. No dilated bowel. Fecalization within distal ileal loops reflects slow transit. No bowel wall thickening or inflammatory changes. Mild colorectal stool burden. No ascites. Vascular/Lymphatic: Vasculature is within normal limits for patient's age. No abdominopelvic adenopathy. Reproductive: Unremarkable. Other: External soft tissues are unremarkable. Musculoskeletal: No acute or significant osseous findings. IMPRESSION: No evidence of nephrolithiasis or hydronephrosis. Distal ileal fecalization reflecting slow transit. No bowel obstruction. Hepatic steatosis. Electronically Signed   By: Stana Bunting M.D.   On: 04/12/2020 09:37    Procedures Procedures (including critical care time)  Medications Ordered in ED Medications  fentaNYL (SUBLIMAZE) injection 50 mcg (50 mcg Intravenous Given 04/12/20 0755)  ondansetron (ZOFRAN) injection 4 mg (4 mg Intravenous Given 04/12/20 0755)  sodium chloride 0.9 % bolus 500 mL (0 mLs Intravenous Stopped 04/12/20 0904)  fentaNYL (SUBLIMAZE) injection 50 mcg (50 mcg Intravenous Given 04/12/20 5183)    ED Course  I have reviewed the triage vital signs and the nursing notes.  Pertinent labs & imaging results that were available during my care of the patient were reviewed by me and considered in my medical decision making (see chart for details).    MDM Rules/Calculators/A&P                          Patient is a 34 year old female who presents with right-sided back pain..  She has some mild lower back pain which radiates a little bit down her sciatic nerve and seems to be more musculoskeletal in nature.  She does have some right side higher back pain which is less clear.  It radiates a little  bit to her flank.  She has had a little bit of an elevation in her WBC count.  Her urine has some blood in it but has just ended her menstrual cycle.  There is no suggestions of infection in her urine.  Her LFTs are normal.  She is status post cholecystectomy.  She had a CT scan which shows no acute abnormality.  There are some slight fecal transit but nothing that appears more acute.  The appendix appears normal.  No other suggestion of with exercising.  She is tolerating oral fluids.  She was discharged home in good condition.  She was encouraged to follow-up with her PCP for recheck.  She will also need to have her glucose rechecked.  She also states she works at [employer] and potentially will follow up with one of the spine surgeons there.  Given her WBC count was elevated, she was advised to return here if she has any onset of fevers or other worsening symptoms. Final Clinical Impression(s) / ED Diagnoses Final diagnoses:  Acute right-sided low back pain without sciatica  Elevated blood sugar    Rx / DC Orders ED Discharge Orders         Ordered    HYDROcodone-acetaminophen (NORCO/VICODIN) 5-325 MG tablet  Every 4 hours PRN        04/12/20 0955           Rolan Bucco, MD 04/12/20 1008

## 2020-08-22 ENCOUNTER — Encounter: Payer: Self-pay | Admitting: Family Medicine

## 2021-01-07 ENCOUNTER — Other Ambulatory Visit: Payer: Self-pay | Admitting: Family Medicine

## 2021-01-16 LAB — BASIC METABOLIC PANEL
BUN: 8 (ref 4–21)
CO2: 18 (ref 13–22)
Chloride: 101 (ref 99–108)
Creatinine: 0.8 (ref 0.5–1.1)
Glucose: 84
Potassium: 5 (ref 3.4–5.3)
Sodium: 138 (ref 137–147)

## 2021-01-16 LAB — HEPATIC FUNCTION PANEL
ALT: 32 (ref 7–35)
AST: 31 (ref 13–35)
Alkaline Phosphatase: 95 (ref 25–125)
Bilirubin, Total: 0.3

## 2021-01-16 LAB — COMPREHENSIVE METABOLIC PANEL
Albumin: 4.8 (ref 3.5–5.0)
Calcium: 10 (ref 8.7–10.7)
GFR calc non Af Amer: 100
Globulin: 2.5

## 2021-01-16 LAB — CBC AND DIFFERENTIAL
HCT: 42 (ref 36–46)
Hemoglobin: 13.3 (ref 12.0–16.0)
Neutrophils Absolute: 5.9
Platelets: 389 (ref 150–399)
WBC: 8.9

## 2021-01-16 LAB — CBC: RBC: 5.27 — AB (ref 3.87–5.11)

## 2021-01-22 ENCOUNTER — Ambulatory Visit (INDEPENDENT_AMBULATORY_CARE_PROVIDER_SITE_OTHER): Payer: PRIVATE HEALTH INSURANCE | Admitting: Bariatrics

## 2021-01-23 ENCOUNTER — Other Ambulatory Visit: Payer: Self-pay

## 2021-01-23 ENCOUNTER — Ambulatory Visit (INDEPENDENT_AMBULATORY_CARE_PROVIDER_SITE_OTHER): Payer: No Typology Code available for payment source | Admitting: Family Medicine

## 2021-01-23 ENCOUNTER — Encounter (INDEPENDENT_AMBULATORY_CARE_PROVIDER_SITE_OTHER): Payer: Self-pay | Admitting: Family Medicine

## 2021-01-23 VITALS — BP 134/84 | HR 100 | Temp 98.3°F | Ht 63.0 in | Wt 201.0 lb

## 2021-01-23 DIAGNOSIS — R0602 Shortness of breath: Secondary | ICD-10-CM

## 2021-01-23 DIAGNOSIS — R5383 Other fatigue: Secondary | ICD-10-CM | POA: Diagnosis not present

## 2021-01-23 DIAGNOSIS — Z0289 Encounter for other administrative examinations: Secondary | ICD-10-CM

## 2021-01-23 DIAGNOSIS — I10 Essential (primary) hypertension: Secondary | ICD-10-CM

## 2021-01-23 DIAGNOSIS — E66812 Obesity, class 2: Secondary | ICD-10-CM

## 2021-01-23 DIAGNOSIS — Z1331 Encounter for screening for depression: Secondary | ICD-10-CM

## 2021-01-23 DIAGNOSIS — Z9189 Other specified personal risk factors, not elsewhere classified: Secondary | ICD-10-CM

## 2021-01-23 DIAGNOSIS — R739 Hyperglycemia, unspecified: Secondary | ICD-10-CM | POA: Diagnosis not present

## 2021-01-23 DIAGNOSIS — Z6835 Body mass index (BMI) 35.0-35.9, adult: Secondary | ICD-10-CM

## 2021-01-24 LAB — COMPREHENSIVE METABOLIC PANEL
ALT: 42 IU/L — ABNORMAL HIGH (ref 0–32)
AST: 35 IU/L (ref 0–40)
Albumin/Globulin Ratio: 1.9 (ref 1.2–2.2)
Albumin: 4.8 g/dL (ref 3.8–4.8)
Alkaline Phosphatase: 89 IU/L (ref 44–121)
BUN/Creatinine Ratio: 11 (ref 9–23)
BUN: 9 mg/dL (ref 6–20)
Bilirubin Total: 0.4 mg/dL (ref 0.0–1.2)
CO2: 22 mmol/L (ref 20–29)
Calcium: 9.5 mg/dL (ref 8.7–10.2)
Chloride: 99 mmol/L (ref 96–106)
Creatinine, Ser: 0.79 mg/dL (ref 0.57–1.00)
Globulin, Total: 2.5 g/dL (ref 1.5–4.5)
Glucose: 86 mg/dL (ref 65–99)
Potassium: 4.6 mmol/L (ref 3.5–5.2)
Sodium: 137 mmol/L (ref 134–144)
Total Protein: 7.3 g/dL (ref 6.0–8.5)
eGFR: 99 mL/min/{1.73_m2} (ref >59)

## 2021-01-24 LAB — HEMOGLOBIN A1C
Est. average glucose Bld gHb Est-mCnc: 111 mg/dL
Hgb A1c MFr Bld: 5.5 % (ref 4.8–5.6)

## 2021-01-24 LAB — LIPID PANEL WITH LDL/HDL RATIO
Cholesterol, Total: 257 mg/dL — ABNORMAL HIGH (ref 100–199)
HDL: 60 mg/dL (ref >39)
LDL Chol Calc (NIH): 160 mg/dL — ABNORMAL HIGH (ref 0–99)
LDL/HDL Ratio: 2.7 ratio (ref 0.0–3.2)
Triglycerides: 201 mg/dL — ABNORMAL HIGH (ref 0–149)
VLDL Cholesterol Cal: 37 mg/dL (ref 5–40)

## 2021-01-24 LAB — CBC WITH DIFFERENTIAL
Basophils Absolute: 0 10*3/uL (ref 0.0–0.2)
Basos: 0 %
EOS (ABSOLUTE): 0.1 10*3/uL (ref 0.0–0.4)
Eos: 2 %
Hematocrit: 41.1 % (ref 34.0–46.6)
Hemoglobin: 13.8 g/dL (ref 11.1–15.9)
Immature Grans (Abs): 0 10*3/uL (ref 0.0–0.1)
Immature Granulocytes: 0 %
Lymphocytes Absolute: 2.2 10*3/uL (ref 0.7–3.1)
Lymphs: 26 %
MCH: 26.1 pg — ABNORMAL LOW (ref 26.6–33.0)
MCHC: 33.6 g/dL (ref 31.5–35.7)
MCV: 78 fL — ABNORMAL LOW (ref 79–97)
Monocytes Absolute: 0.4 10*3/uL (ref 0.1–0.9)
Monocytes: 5 %
Neutrophils Absolute: 5.4 10*3/uL (ref 1.4–7.0)
Neutrophils: 67 %
RBC: 5.28 x10E6/uL (ref 3.77–5.28)
RDW: 15.7 % — ABNORMAL HIGH (ref 11.7–15.4)
WBC: 8.2 10*3/uL (ref 3.4–10.8)

## 2021-01-24 LAB — VITAMIN B12: Vitamin B-12: 335 pg/mL (ref 232–1245)

## 2021-01-24 LAB — INSULIN, RANDOM: INSULIN: 12.5 u[IU]/mL (ref 2.6–24.9)

## 2021-01-24 LAB — T3: T3, Total: 203 ng/dL — ABNORMAL HIGH (ref 71–180)

## 2021-01-24 LAB — TSH: TSH: 2.87 u[IU]/mL (ref 0.450–4.500)

## 2021-01-24 LAB — T4, FREE: Free T4: 1.45 ng/dL (ref 0.82–1.77)

## 2021-01-24 LAB — VITAMIN D 25 HYDROXY (VIT D DEFICIENCY, FRACTURES): Vit D, 25-Hydroxy: 51 ng/mL (ref 30.0–100.0)

## 2021-01-24 LAB — FOLATE: Folate: 5.9 ng/mL (ref >3.0)

## 2021-01-24 NOTE — Progress Notes (Signed)
Chief Complaint:   OBESITY Erin Flores (MR# [id number]) is a 35 y.o. female who presents for evaluation and treatment of obesity and related comorbidities. Current BMI is Body mass index is 35.61 kg/m. [name] has been struggling with her weight for many years and has been unsuccessful in either losing weight, maintaining weight loss, or reaching her healthy weight goal.  [name] is currently in the action stage of change and ready to dedicate time achieving and maintaining a healthier weight. [name] is interested in becoming our patient and working on intensive lifestyle modifications including (but not limited to) diet and exercise for weight loss.  [name]'s habits were reviewed today and are as follows: Her family eats meals together, she thinks her family will eat healthier with her, her desired weight loss is 51-61 lbs, she has been heavy most of her life, she started gaining weight late 2019, her heaviest weight ever was 230 pounds, she has significant food cravings issues, she snacks frequently in the evenings, she skips meals frequently, she is frequently drinking liquids with calories, she frequently makes poor food choices, she has problems with excessive hunger, she frequently eats larger portions than normal, and she struggles with emotional eating.  Depression Screen Randal's Food and Mood (modified PHQ-9) score was 18.  Depression screen [facility] 2/9 01/23/2021  Decreased Interest 3  Down, Depressed, Hopeless 2  PHQ - 2 Score 5  Altered sleeping 3  Tired, decreased energy 3  Change in appetite 2  Feeling bad or failure about yourself  1  Trouble concentrating 2  Moving slowly or fidgety/restless 2  Suicidal thoughts 0  PHQ-9 Score 18  Difficult doing work/chores Not difficult at all  Some encounter information is confidential and restricted. Go to Review Flowsheets activity to see all data.   Subjective:   1. Other fatigue [name] admits to daytime somnolence  and admits to waking up still tired. Patent has a history of symptoms of daytime fatigue and morning headache. [name] generally gets 7 hours of sleep per night, and states that she has difficulty falling asleep. Snoring is not present. Apneic episodes are not present. Epworth Sleepiness Score is 2.  2. SOB (shortness of breath) on exertion [name] notes increasing shortness of breath with exercising and seems to be worsening over time with weight gain. She notes getting out of breath sooner with activity than she used to. This has not gotten worse recently. [name] denies shortness of breath at rest or orthopnea.  3. Essential hypertension [name] is on medications to help control her hypertension. Her blood pressure elevation may be due in part to some of her medications.  4. Hyperglycemia [name] has some elevated glucose readings in the past, which may be at least partially related to steroid injections and autoimmune issues with psoriatic arthritis.  5. At risk for heart disease [name] is at a higher than average risk for cardiovascular disease due to obesity.   Assessment/Plan:   1. Other fatigue [name] does feel that her weight is causing her energy to be lower than it should be. Fatigue may be related to obesity, depression or many other causes. Labs will be ordered, and in the meanwhile, [name] will focus on self care including making healthy food choices, increasing physical activity and focusing on stress reduction.  - Vitamin B12 - VITAMIN D 25 Hydroxy (Vit-D Deficiency, Fractures) - TSH - Lipid Panel With LDL/HDL Ratio - Folate - T3 - CBC With Differential - EKG 12-Lead - T4,  free  2. SOB (shortness of breath) on exertion [name] does feel that she gets out of breath more easily that she used to when she exercises. [name]'s shortness of breath appears to be obesity related and exercise induced. She has agreed to work on weight loss and gradually increase exercise to  treat her exercise induced shortness of breath. Will continue to monitor closely.  3. Essential hypertension We will check labs today. [name] will start her Category 2 plan, and will work on healthy weight loss and exercise to improve blood pressure control. We will watch for signs of hypotension as she continues her lifestyle modifications.  - Comprehensive metabolic panel  4. Hyperglycemia Fasting labs will be obtained today, and results with be discussed with [name] in 2 weeks at her follow up visit. In the meanwhile [name] will start on her Category 2 plan and will work on weight loss efforts.  - Insulin, random - Hemoglobin A1c  5. Screening for depression [name] had a positive depression screening. Depression is commonly associated with obesity and often results in emotional eating behaviors. We will monitor this closely and work on CBT to help improve the non-hunger eating patterns. Referral to Psychology may be required if no improvement is seen as she continues in our clinic.  6. At risk for heart disease [name] was given approximately 30 minutes of coronary artery disease prevention counseling today. She is 35 y.o. female and has risk factors for heart disease including obesity. We discussed intensive lifestyle modifications today with an emphasis on specific weight loss instructions and strategies.   Repetitive spaced learning was employed today to elicit superior memory formation and behavioral change.  7. Obesity with current BMI 35.7 [name] is currently in the action stage of change and her goal is to continue with weight loss efforts. I recommend Devanee begin the structured treatment plan as follows:  She has agreed to the Category 2 Plan.  Exercise goals: No exercise has been prescribed for now, while we concentrate on nutritional changes.  Behavioral modification strategies: increasing lean protein intake.  She was informed of the importance of frequent follow-up  visits to maximize her success with intensive lifestyle modifications for her multiple health conditions. She was informed we would discuss her lab results at her next visit unless there is a critical issue that needs to be addressed sooner. Jhane agreed to keep her next visit at the agreed upon time to discuss these results.  Objective:   Blood pressure 134/84, pulse 100, temperature 98.3 F (36.8 C), height 5\' 3"  (1.6 m), weight 201 lb (91.2 kg), SpO2 98 %. Body mass index is 35.61 kg/m.  EKG: Normal sinus rhythm, rate 105 BPM.  Indirect Calorimeter completed today shows a VO2 of 210 and a REE of 1440.  Her calculated basal metabolic rate is thus her basal metabolic rate is worse than expected.  General: Cooperative, alert, well developed, in no acute distress. HEENT: Conjunctivae and lids unremarkable. Cardiovascular: Regular rhythm.  Lungs: Normal work of breathing. Neurologic: No focal deficits.   Lab Results  Component Value Date   CREATININE 0.79 01/23/2021   BUN 9 01/23/2021   NA 137 01/23/2021   K 4.6 01/23/2021   CL 99 01/23/2021   CO2 22 01/23/2021   Lab Results  Component Value Date   ALT 42 (H) 01/23/2021   AST 35 01/23/2021   ALKPHOS 89 01/23/2021   BILITOT 0.4 01/23/2021   Lab Results  Component Value Date   HGBA1C 5.5  01/23/2021   Lab Results  Component Value Date   INSULIN 12.5 01/23/2021   Lab Results  Component Value Date   TSH 2.870 01/23/2021   Lab Results  Component Value Date   CHOL 257 (H) 01/23/2021   HDL 60 01/23/2021   LDLCALC 160 (H) 01/23/2021   TRIG 201 (H) 01/23/2021   CHOLHDL 4 02/24/2020   Lab Results  Component Value Date   WBC 8.2 01/23/2021   HGB 13.8 01/23/2021   HCT 41.1 01/23/2021   MCV 78 (L) 01/23/2021   PLT 382 04/12/2020   No results found for: IRON, TIBC, FERRITIN  Attestation Statements:   Reviewed by clinician on day of visit: allergies, medications, problem list, medical history, surgical history,  family history, social history, and previous encounter notes.   I, Evgenia Merriman, am acting as transcriptionist for Quillian Quince, MD.  I have reviewed the above documentation for accuracy and completeness, and I agree with the above. - Quillian Quince, MD

## 2021-01-28 ENCOUNTER — Other Ambulatory Visit: Payer: Self-pay

## 2021-01-28 ENCOUNTER — Ambulatory Visit: Payer: No Typology Code available for payment source | Admitting: Family Medicine

## 2021-01-28 ENCOUNTER — Encounter: Payer: Self-pay | Admitting: Family Medicine

## 2021-01-28 VITALS — BP 118/81 | HR 114 | Temp 97.8°F | Resp 16 | Ht 63.0 in | Wt 208.6 lb

## 2021-01-28 DIAGNOSIS — D509 Iron deficiency anemia, unspecified: Secondary | ICD-10-CM | POA: Diagnosis not present

## 2021-01-28 DIAGNOSIS — R7989 Other specified abnormal findings of blood chemistry: Secondary | ICD-10-CM

## 2021-01-28 DIAGNOSIS — R000 Tachycardia, unspecified: Secondary | ICD-10-CM | POA: Diagnosis not present

## 2021-01-28 DIAGNOSIS — I10 Essential (primary) hypertension: Secondary | ICD-10-CM

## 2021-01-28 LAB — TSH: TSH: 3.1 u[IU]/mL (ref 0.35–5.50)

## 2021-01-28 LAB — T4, FREE: Free T4: 0.71 ng/dL (ref 0.60–1.60)

## 2021-01-28 MED ORDER — IRBESARTAN 150 MG PO TABS
150.0000 mg | ORAL_TABLET | Freq: Every day | ORAL | 3 refills | Status: DC
Start: 2021-01-28 — End: 2021-02-22

## 2021-01-28 NOTE — Progress Notes (Signed)
OFFICE VISIT  01/28/2021  CC:  Chief Complaint  Patient presents with   Follow-up    Blood pressure   HPI:    Patient is a 35 y.o. Caucasian female who presents for f/u uncontrolled HTN-->I last saw her 11 months ago. A/P as of last visit: "Uncontrolled HTN: improving but not at goal of avg <130 over <80. Increase irbesartan to 150mg  qd. I don't think her headaches are coming from this med but if they excalate on increased dose then we'll change medications. Continue with increased efforts at diet/exercise/wt loss and continue bp monitoring outside of medical office. No labs today.   She asked about me managing contraceptive for her but given all the factors involved (bp's, she took her nexplanon out 2 mo ago, has not had another menses yet) I recommended she get this managed through her OB/GYN MD."  INTERIM HX: She stopped taking her bp med not long after I last saw her. She says the last couple months bp was back up into 160s so she restarted irbesart 150 qd. Now bp's have consistently wnl.  Review of EMR, shows HR in low 100s has not been uncommon at medical office visits dating back to 2017. When I saw her last she was on adderall.  She is now on vyvanse 70 qd since approx Jan this year. She sees Dr. Feb, psychiatrist in [place].  Recent labs at Bariatric clinic on 01/23/21-->Hba1c 5.5%, ALT 42 but otherwise her CMET was normal. Trigs 201, LDL 160, HDL 60.    Past Medical History:  Diagnosis Date   Anemia    Anxiety and depression    Attention deficit disorder (ADD)    Bilateral lower extremity edema 2020/2021   Bipolar affective disorder (HCC)    question of:  pt saw psychiatrist and was on lamictal and tegretol aroun 2018; takes clonoprin 2mg  prn for social anxiety.  he majory sx's were in the context of signif psychsocial probs with the father of her daughter.   Chest pain    GERD (gastroesophageal reflux disease)    Herpes zoster 2017   History of iron  deficiency anemia    History of pyelonephritis 2018   Hypertension    Migraines    "I take a lot of ibuprofen".  Worse on OCPs.  Gets 8 tension HA's a month and 1-2 migraines per month.   Psoriatic arthritis (HCC) 2014 dx   Enbrel 2020. Simponi 2021->?htn and edema rx?->stopped by Dr. 2019 12/2019.  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 Procedure: LAPAROSCOPIC CHOLECYSTECTOMY WITH INTRAOPERATIVE CHOLANGIOGRAM;  Surgeon: 04[birth year]01[birth year]1992, MD;  Location: [facility];  Service: General;  Laterality: N/A;    Outpatient Medications Prior to Visit  Medication Sig Dispense Refill   lisdexamfetamine (VYVANSE) 70 MG capsule Take 70 mg by mouth daily.     norethindrone-ethinyl estradiol-FE (LOESTRIN FE) 1-20 MG-MCG tablet Take 1 tablet by mouth daily.     zaleplon (SONATA) 10 MG capsule Take 10 mg by mouth at bedtime as needed for sleep.     irbesartan (AVAPRO) 150 MG tablet Take 150 mg by mouth daily.     ALPRAZolam (XANAX) 1 MG tablet Take 1 mg by mouth 2 (two) times daily as needed. (Patient not taking: Reported on 01/28/2021)     Upadacitinib ER 30 MG TB24 Take by mouth. (Patient not taking: Reported on 01/28/2021)     amphetamine-dextroamphetamine (ADDERALL) 20  MG tablet Take 20 mg by mouth in the morning, at noon, and at bedtime.  (Patient not taking: Reported on 01/28/2021)     No facility-administered medications prior to visit.    Allergies  Allergen Reactions   Dilaudid [Hydromorphone Hcl] Other (See Comments)    Issues with urination   Simponi [Golimumab] Hypertension    ? HTN and LE edema    ROS As per HPI  PE: Vitals with BMI 01/28/2021 01/23/2021 04/12/2020  Height 5\' 3"  5\' 3"  -  Weight 208 lbs 10 oz 201 lbs -  BMI 36.96 35.61 -  Systolic 118 134  Diastolic 81 84 71  Pulse 114 100 92  Some encounter information is confidential and restricted. Go to Review Flowsheets  activity to see all data.   Gen: Alert, well appearing.  Patient is oriented to person, place, time, and situation. AFFECT: pleasant, lucid thought and speech. Neck: no palpable thyromegaly, nodule, or tenderness. CV: regular, tachy to 115-120, no m/r/g Chest is clear, no wheezing or rales. Normal symmetric air entry throughout both lung fields. No chest wall deformities or tenderness. EXT: no clubbing or cyanosis.  no edema.    LABS:  Lab Results  Component Value Date   TSH 2.870 01/23/2021   Lab Results  Component Value Date   WBC 8.2 01/23/2021   HGB 13.8 01/23/2021   HCT 41.1 01/23/2021   MCV 78 (L) 01/23/2021   PLT 382 04/12/2020   Lab Results  Component Value Date   CREATININE 0.79 01/23/2021   BUN 9 01/23/2021   NA 137 01/23/2021   K 4.6 01/23/2021   CL 99 01/23/2021   CO2 22 01/23/2021   Lab Results  Component Value Date   ALT 42 (H) 01/23/2021   AST 35 01/23/2021   ALKPHOS 89 01/23/2021   BILITOT 0.4 01/23/2021   Lab Results  Component Value Date   CHOL 257 (H) 01/23/2021   Lab Results  Component Value Date   HDL 60 01/23/2021   Lab Results  Component Value Date   LDLCALC 160 (H) 01/23/2021   Lab Results  Component Value Date   TRIG 201 (H) 01/23/2021   Lab Results  Component Value Date   CHOLHDL 4 02/24/2020   Lab Results  Component Value Date   HGBA1C 5.5 01/23/2021   IMPRESSION AND PLAN:  1) HTN, well controlled on irbesartan 150 qd. Lytes/cr normal 1 wk ago at bariatric clinic.  2) Elevated T3 level 1 wk ago (normal free T4 and TSH).   Unclear significance but given her resting tachycardia (worsening somewhat) I think it bears some further attention.  TSH down from 3.35 to 2.87 over the last year. Rpeat  TSH, free T4, and T3 total.  Also check Thyrotropin receptor antibodies today.  3) Iron def anemia per pt report. Most recent CBC 1 wk ago with normal Hb/Hct but MCV 78. We'll get most recent labs from her rheum MD (about 2 wks  ago and see if iron level done, see what Hb was at that time).  4) Sinus tachycardia (see HPI for details).  HR about 115-120 on exam here today. Suspect there is some effect from her vyvanse contributing.   Repeating thyroid labs per #2 above. Discussed adding BB or CCB but since she's asymptomatic we chose to hold off for now.  An After Visit Summary was printed and given to the patient.  FOLLOW UP: Return for to be determined based on lab results.  Signed:  Santiago Bumpers, MD           01/28/2021

## 2021-01-29 LAB — T3: T3, Total: 96 ng/dL (ref 76–181)

## 2021-01-29 LAB — SPECIMEN STATUS REPORT

## 2021-01-29 LAB — THYROTROPIN RECEPTOR AUTOABS

## 2021-02-06 ENCOUNTER — Encounter (INDEPENDENT_AMBULATORY_CARE_PROVIDER_SITE_OTHER): Payer: Self-pay | Admitting: Family Medicine

## 2021-02-06 ENCOUNTER — Other Ambulatory Visit: Payer: Self-pay

## 2021-02-06 ENCOUNTER — Ambulatory Visit (INDEPENDENT_AMBULATORY_CARE_PROVIDER_SITE_OTHER): Payer: PRIVATE HEALTH INSURANCE | Admitting: Family Medicine

## 2021-02-06 ENCOUNTER — Ambulatory Visit (INDEPENDENT_AMBULATORY_CARE_PROVIDER_SITE_OTHER): Payer: No Typology Code available for payment source | Admitting: Family Medicine

## 2021-02-06 VITALS — BP 124/80 | HR 111 | Temp 97.9°F | Ht 63.0 in | Wt 197.0 lb

## 2021-02-06 DIAGNOSIS — R519 Headache, unspecified: Secondary | ICD-10-CM

## 2021-02-06 DIAGNOSIS — Z6835 Body mass index (BMI) 35.0-35.9, adult: Secondary | ICD-10-CM

## 2021-02-06 DIAGNOSIS — R7401 Elevation of levels of liver transaminase levels: Secondary | ICD-10-CM | POA: Diagnosis not present

## 2021-02-06 DIAGNOSIS — E782 Mixed hyperlipidemia: Secondary | ICD-10-CM | POA: Diagnosis not present

## 2021-02-06 DIAGNOSIS — E8881 Metabolic syndrome: Secondary | ICD-10-CM

## 2021-02-06 MED ORDER — METFORMIN HCL 500 MG PO TABS: 500.0000 mg | ORAL_TABLET | Freq: Every day | ORAL | 0 refills | Status: DC

## 2021-02-06 NOTE — Progress Notes (Signed)
Chief Complaint:   OBESITY Erin Flores is here to discuss her progress with her obesity treatment plan along with follow-up of her obesity related diagnoses. Erin Flores is on the Category 2 Plan and states she is following her eating plan approximately 100% of the time. Erin Flores states she is at the gym doing cardio and light weights for 30-40 minutes 5 times per week.  Today's visit was #: 2 Starting weight: 201 lbs Starting date: 01/23/2021 Today's weight: 197 lbs Today's date: 02/06/2021 Total lbs lost to date: 4 Total lbs lost since last in-office visit: 4  Interim History: Erin Flores has done very well with weight loss on her Category 2 plan. She struggles with monotony of this plan and would like more choices.  Subjective:   1. Intractable headache, unspecified chronicity pattern, unspecified headache type Erin Flores has been having chronic daily headaches. She has tried medications to help and she is taking ibuprofen daily with limited improvement.  2. Elevated alanine aminotransferase (ALT) level Erin Flores's ALT is mildly elevated. She denies abdominal pain or jaundice. I discussed labs with the patient today.  3. Mixed hyperlipidemia Erin Flores is not on statin, and she is working on diet. She denies chest pain. I discussed labs with the patient today.  4. Insulin resistance Erin Flores has a new diagnosis of insulin resistance. She has a normal A1c and glucose, but her fasting insulin is elevated and she notes polyphagia. I discussed labs with the patient today.  Assessment/Plan:   1. Intractable headache, unspecified chronicity pattern, unspecified headache type We will refer Erin Flores to Dr. Lucia Flores for evaluation, and will follow up at her next visit.  - Ambulatory referral to Neurology  2. Elevated alanine aminotransferase (ALT) level Erin Flores likely has non-alcoholic fatty liver disease, and she is already doing well with weight loss. She will continue with diet, exercise, and we will  make sure to recheck labs in 3 months.  3. Mixed hyperlipidemia Cardiovascular risk and specific lipid/LDL goals reviewed. We discussed several lifestyle modifications today. Erin Flores will continue to work on diet, exercise and weight loss efforts. We will recheck labs in 3 months to monitor her progress. Orders and follow up as documented in patient record.   4. Insulin resistance Erin Flores agreed to start metformin 500 mg q AM with food #30, with no refills. She will continue to work on weight loss, exercise, and decreasing simple carbohydrates to help decrease the risk of diabetes. Erin Flores agreed to follow-up with Erin Flores as directed to closely monitor her progress.  5. Obesity with current BMI 35.0 Erin Flores is currently in the action stage of change. As such, her goal is to continue with weight loss efforts. She has agreed to the Category 2 Plan or keeping a food journal and adhering to recommended goals of 1100-1400 calories and 80+ grams of protein daily.   Exercise goals: As is.  Behavioral modification strategies: increasing lean protein intake and meal planning and cooking strategies.  Erin Flores has agreed to follow-up with our clinic in 2 weeks. She was informed of the importance of frequent follow-up visits to maximize her success with intensive lifestyle modifications for her multiple health conditions.   Objective:   Blood pressure 124/80, pulse (!) 111, temperature 97.9 F (36.6 C), height 5\' 3"  (1.6 m), weight 197 lb (89.4 kg), SpO2 98 %. Body mass index is 34.9 kg/m.  General: Cooperative, alert, well developed, in no acute distress. HEENT: Conjunctivae and lids unremarkable. Cardiovascular: Regular rhythm.  Lungs: Normal work of breathing. Neurologic: No  focal deficits.   Lab Results  Component Value Date   CREATININE 0.79 01/23/2021   BUN 9 01/23/2021   NA 137 01/23/2021   K 4.6 01/23/2021   CL 99 01/23/2021   CO2 22 01/23/2021   Lab Results  Component Value Date   ALT  42 (H) 01/23/2021   AST 35 01/23/2021   ALKPHOS 89 01/23/2021   BILITOT 0.4 01/23/2021   Lab Results  Component Value Date   HGBA1C 5.5 01/23/2021   Lab Results  Component Value Date   INSULIN 12.5 01/23/2021   Lab Results  Component Value Date   TSH 3.10 01/28/2021   Lab Results  Component Value Date   CHOL 257 (H) 01/23/2021   HDL 60 01/23/2021   LDLCALC 160 (H) 01/23/2021   TRIG 201 (H) 01/23/2021   CHOLHDL 4 02/24/2020   Lab Results  Component Value Date   VD25OH 51.0 01/23/2021   Lab Results  Component Value Date   WBC 8.2 01/23/2021   HGB 13.8 01/23/2021   HCT 41.1 01/23/2021   MCV 78 (L) 01/23/2021   PLT 382 04/12/2020   No results found for: IRON, TIBC, FERRITIN  Attestation Statements:   Reviewed by clinician on day of visit: allergies, medications, problem list, medical history, surgical history, family history, social history, and previous encounter notes.  Time spent on visit including pre-visit chart review and post-visit care and charting was 46 minutes.    I, Jaella Weinert, am acting as transcriptionist for Quillian Quince, MD.  I have reviewed the above documentation for accuracy and completeness, and I agree with the above. -  Quillian Quince, MD

## 2021-02-22 ENCOUNTER — Other Ambulatory Visit: Payer: Self-pay

## 2021-02-22 MED ORDER — IRBESARTAN 150 MG PO TABS
150.0000 mg | ORAL_TABLET | Freq: Every day | ORAL | 3 refills | Status: DC
Start: 2021-02-22 — End: 2022-08-21

## 2021-02-25 ENCOUNTER — Ambulatory Visit (INDEPENDENT_AMBULATORY_CARE_PROVIDER_SITE_OTHER): Payer: No Typology Code available for payment source | Admitting: Family Medicine

## 2021-02-25 ENCOUNTER — Encounter (INDEPENDENT_AMBULATORY_CARE_PROVIDER_SITE_OTHER): Payer: Self-pay | Admitting: Family Medicine

## 2021-02-25 ENCOUNTER — Other Ambulatory Visit: Payer: Self-pay

## 2021-02-25 VITALS — BP 142/83 | HR 113 | Temp 98.1°F | Ht 63.0 in | Wt 193.0 lb

## 2021-02-25 DIAGNOSIS — Z9189 Other specified personal risk factors, not elsewhere classified: Secondary | ICD-10-CM

## 2021-02-25 DIAGNOSIS — Z6835 Body mass index (BMI) 35.0-35.9, adult: Secondary | ICD-10-CM

## 2021-02-25 DIAGNOSIS — E66812 Obesity, class 2: Secondary | ICD-10-CM | POA: Insufficient documentation

## 2021-02-25 DIAGNOSIS — E8881 Metabolic syndrome: Secondary | ICD-10-CM

## 2021-02-25 DIAGNOSIS — I10 Essential (primary) hypertension: Secondary | ICD-10-CM

## 2021-02-25 DIAGNOSIS — E88819 Insulin resistance, unspecified: Secondary | ICD-10-CM

## 2021-02-25 MED ORDER — METFORMIN HCL 500 MG PO TABS: 500.0000 mg | ORAL_TABLET | Freq: Every day | ORAL | 0 refills | Status: DC

## 2021-02-25 NOTE — Progress Notes (Signed)
Chief Complaint:   OBESITY Erin Flores is here to discuss her progress with her obesity treatment plan along with follow-up of her obesity related diagnoses. Erin Flores is on keeping a food journal and adhering to recommended goals of 1100-1400 calories and 80 grams of  protein and states she is following her eating plan approximately 99% of the time. Erin Flores states she is doing gym exercise for 60 minutes 4-5 times per week.  Today's visit was #: 3 Starting weight: 201 lbs Starting date: 01/23/2021 Today's weight: 193 lbs Today's date: 02/25/2021 Total lbs lost to date: 8 lbs Total lbs lost since last in-office visit: 4 lbs  Interim History: Erin Flores is a bit disappointed with only 4 lbs weight loss. She is staying under 1100 calories but has trouble meeting protein goals. She tends struggles to find time to eat at work sometimes. She also tends to get bored with eating the same foods for extended amounts of time.   Subjective:   1. Insulin resistance Erin Flores was started on Metformin. She is tolerating this well but she has had some loose stools.   Lab Results  Component Value Date   INSULIN 12.5 01/23/2021   Lab Results  Component Value Date   HGBA1C 5.5 01/23/2021    2. Essential hypertension Erin Flores's blood pressure is slightly elevated today. It is usually within normal limits. She is on Irbesartan.   BP Readings from Last 3 Encounters:  02/25/21 (!) 142/83  02/06/21 124/80  01/28/21 118/81     3. At risk for side effect of medication Erin Flores is at risk for side effect of medication due to Metformin.  Assessment/Plan:   1. Insulin resistance We will refill Metformin 500 mg every for 3 months with no refills. She may take 1/2 pill if loose stools worsen. Erin Flores will continue to work on weight loss, exercise, and decreasing simple carbohydrates to help decrease the risk of diabetes. Erin Flores agreed to follow-up with [place] as directed to closely monitor her progress.  -  metFORMIN (GLUCOPHAGE) 500 MG tablet; Take 1 tablet (500 mg total) by mouth daily with breakfast.  Dispense: 90 tablet; Refill: 0  2. Essential hypertension Erin Flores will continue Irbesartan.   3. At risk for side effect of medication Erin Flores was given approximately 15 minutes of drug side effect counseling today.  We discussed side effect possibility and risk versus benefits. Erin Flores agreed to the medication and will contact this office if these side effects are intolerable.  Repetitive spaced learning was employed today to elicit superior memory formation and behavioral change.   4. Obesity with current BMI 34.2 Erin Flores is currently in the action stage of change. As such, her goal is to continue with weight loss efforts. She has agreed to the Category 2 Plan and keeping a food journal or adhering to recommended goals of 1100-1400 calories and 80 grams of protein daily.   We discussed expectations for weight loss. I advised her that she was losing weight at a very good rate. We discussed other breakfast options she could have instead of eggs.   Exercise goals:  As is.  Behavioral modification strategies: meal planning and cooking strategies and better snacking choices. Handouts: Lunch and breakfast options.  Erin Flores has agreed to follow-up with our clinic in 2 weeks.  Objective:   Blood pressure (!) 142/83, pulse (!) 113, temperature 98.1 F (36.7 C), height 5\' 3"  (1.6 m), weight 193 lb (87.5 kg), SpO2 99 %. Body mass index is 34.19 kg/m.  General:  Cooperative, alert, well developed, in no acute distress. HEENT: Conjunctivae and lids unremarkable. Cardiovascular: Regular rhythm.  Lungs: Normal work of breathing. Neurologic: No focal deficits.   Lab Results  Component Value Date   CREATININE 0.79 01/23/2021   BUN 9 01/23/2021   NA 137 01/23/2021   K 4.6 01/23/2021   CL 99 01/23/2021   CO2 22 01/23/2021   Lab Results  Component Value Date   ALT 42 (H) 01/23/2021   AST 35  01/23/2021   ALKPHOS 89 01/23/2021   BILITOT 0.4 01/23/2021   Lab Results  Component Value Date   HGBA1C 5.5 01/23/2021   Lab Results  Component Value Date   INSULIN 12.5 01/23/2021   Lab Results  Component Value Date   TSH 3.10 01/28/2021   Lab Results  Component Value Date   CHOL 257 (H) 01/23/2021   HDL 60 01/23/2021   LDLCALC 160 (H) 01/23/2021   TRIG 201 (H) 01/23/2021   CHOLHDL 4 02/24/2020   Lab Results  Component Value Date   VD25OH 51.0 01/23/2021   Lab Results  Component Value Date   WBC 8.2 01/23/2021   HGB 13.8 01/23/2021   HCT 41.1 01/23/2021   MCV 78 (L) 01/23/2021   PLT 389 01/16/2021   No results found for: IRON, TIBC, FERRITIN  Attestation Statements:   Reviewed by clinician on day of visit: allergies, medications, problem list, medical history, surgical history, family history, social history, and previous encounter notes.  I, Jackson Latino, RMA, am acting as Energy manager for [place], FNP.   I have reviewed the above documentation for accuracy and completeness, and I agree with the above. -  Jesse Sans, FNP

## 2021-03-18 ENCOUNTER — Other Ambulatory Visit: Payer: Self-pay

## 2021-03-18 ENCOUNTER — Encounter (INDEPENDENT_AMBULATORY_CARE_PROVIDER_SITE_OTHER): Payer: Self-pay | Admitting: Family Medicine

## 2021-03-18 ENCOUNTER — Ambulatory Visit (INDEPENDENT_AMBULATORY_CARE_PROVIDER_SITE_OTHER): Payer: No Typology Code available for payment source | Admitting: Family Medicine

## 2021-03-18 VITALS — BP 131/82 | HR 101 | Temp 97.7°F | Ht 63.0 in | Wt 194.0 lb

## 2021-03-18 DIAGNOSIS — Z9189 Other specified personal risk factors, not elsewhere classified: Secondary | ICD-10-CM

## 2021-03-18 DIAGNOSIS — E88819 Insulin resistance, unspecified: Secondary | ICD-10-CM

## 2021-03-18 DIAGNOSIS — E8881 Metabolic syndrome: Secondary | ICD-10-CM | POA: Diagnosis not present

## 2021-03-18 DIAGNOSIS — I10 Essential (primary) hypertension: Secondary | ICD-10-CM

## 2021-03-18 DIAGNOSIS — Z6835 Body mass index (BMI) 35.0-35.9, adult: Secondary | ICD-10-CM

## 2021-03-18 MED ORDER — TIRZEPATIDE 2.5 MG/0.5ML ~~LOC~~ SOAJ: 2.5000 mg | SUBCUTANEOUS | 0 refills | Status: DC

## 2021-03-18 NOTE — Progress Notes (Signed)
Chief Complaint:   OBESITY Erin Flores is here to discuss her progress with her obesity treatment plan along with follow-up of her obesity related diagnoses. Erin Flores is on keeping a food journal and adhering to recommended goals of 1100-1400 calories and 80 grams of protein and states she is following her eating plan approximately 90% of the time. Erin Flores states she is doing gym exercise for 60 minutes 3-5 times per week.  Today's visit was #: 4 Starting weight: 201 lbs Starting date: 01/23/2021 Today's weight: 194 lbs Today's date: 03/18/2021 Total lbs lost to date: 7 lbs Total lbs lost since last in-office visit: 0  Interim History: Erin Flores notes quite a bit of stress (due to work). She also repots fatigue and increased hunger. She has not been journaling with an app but keeps track in her head. She has a protein shake and a yogurt in the a.m. for breakfast. She has a frozen meal at lunch.  Subjective:   1. Insulin resistance Erin Flores notes increased hunger cravings recently. She has had alternating constipation and diarrhea with Metformin. She is status post cholecystectomy. She denies personal/family history of thyroid cancer and pancreatitis.   Lab Results  Component Value Date   INSULIN 12.5 01/23/2021   Lab Results  Component Value Date   HGBA1C 5.5 01/23/2021    2. Essential hypertension Erin Flores's blood pressure is well controlled. She is on Irbesartan 150 mg daily.  BP Readings from Last 3 Encounters:  03/18/21 131/82  02/25/21 (!) 142/83  02/06/21 124/80   3. At risk for side effect of medication Erin Flores is at risk for side effect of medication due to start of Mounjaro.  Assessment/Plan:   1. Insulin resistance Erin Flores will discontinue Metformin. She agreed to start Mounjaro 2.5 mg weekly with no refills. She is on OCP. I advised her to use back up birth control for one month at the tart of this medicine and with any dose changes.  - tirzepatide [facility]) 2.5  MG/0.5ML Pen; Inject 2.5 mg into the skin once a week.  Dispense: 2 mL; Refill: 0  2. Essential hypertension Erin Flores will continue Irbesartan.   3. At risk for side effect of medication Erin Flores was given approximately 15 minutes of drug side effect counseling today.  We discussed side effect possibility and risk versus benefits. Erin Flores agreed to the medication and will contact this office if these side effects are intolerable.  Repetitive spaced learning was employed today to elicit superior memory formation and behavioral change.   4. Obesity with current BMI 34.37 Erin Flores is currently in the action stage of change. As such, her goal is to continue with weight loss efforts. She has agreed to keeping a food journal and adhering to recommended goals of 1100-1400 calories and 80 grams of protein.   Erin Flores will journal 5 out of 7 days per week.  Exercise goals:  As is.  Behavioral modification strategies: increasing lean protein intake.  Erin Flores has agreed to follow-up with our clinic in 2 weeks.  Objective:   Blood pressure 131/82, pulse (!) 101, temperature 97.7 F (36.5 C), height 5\' 3"  (1.6 m), weight 194 lb (88 kg), SpO2 99 %. Body mass index is 34.37 kg/m.  General: Cooperative, alert, well developed, in no acute distress. HEENT: Conjunctivae and lids unremarkable. Cardiovascular: Regular rhythm.  Lungs: Normal work of breathing. Neurologic: No focal deficits.   Lab Results  Component Value Date   CREATININE 0.79 01/23/2021   BUN 9 01/23/2021   NA 137  01/23/2021   K 4.6 01/23/2021   CL 99 01/23/2021   CO2 22 01/23/2021   Lab Results  Component Value Date   ALT 42 (H) 01/23/2021   AST 35 01/23/2021   ALKPHOS 89 01/23/2021   BILITOT 0.4 01/23/2021   Lab Results  Component Value Date   HGBA1C 5.5 01/23/2021   Lab Results  Component Value Date   INSULIN 12.5 01/23/2021   Lab Results  Component Value Date   TSH 3.10 01/28/2021   Lab Results  Component  Value Date   CHOL 257 (H) 01/23/2021   HDL 60 01/23/2021   LDLCALC 160 (H) 01/23/2021   TRIG 201 (H) 01/23/2021   CHOLHDL 4 02/24/2020   Lab Results  Component Value Date   VD25OH 51.0 01/23/2021   Lab Results  Component Value Date   WBC 8.2 01/23/2021   HGB 13.8 01/23/2021   HCT 41.1 01/23/2021   MCV 78 (L) 01/23/2021   PLT 389 01/16/2021   No results found for: IRON, TIBC, FERRITIN  Attestation Statements:   Reviewed by clinician on day of visit: allergies, medications, problem list, medical history, surgical history, family history, social history, and previous encounter notes.  I, Jackson Latino, RMA, am acting as Energy manager for [place], FNP.   I have reviewed the above documentation for accuracy and completeness, and I agree with the above. -  Jesse Sans, FNP

## 2021-04-04 ENCOUNTER — Other Ambulatory Visit: Payer: Self-pay

## 2021-04-04 ENCOUNTER — Encounter (INDEPENDENT_AMBULATORY_CARE_PROVIDER_SITE_OTHER): Payer: Self-pay | Admitting: Physician Assistant

## 2021-04-04 ENCOUNTER — Ambulatory Visit (INDEPENDENT_AMBULATORY_CARE_PROVIDER_SITE_OTHER): Payer: No Typology Code available for payment source | Admitting: Physician Assistant

## 2021-04-04 VITALS — BP 119/79 | HR 85 | Temp 98.0°F | Ht 63.0 in | Wt 189.0 lb

## 2021-04-04 DIAGNOSIS — Z9189 Other specified personal risk factors, not elsewhere classified: Secondary | ICD-10-CM | POA: Diagnosis not present

## 2021-04-04 DIAGNOSIS — E8881 Metabolic syndrome: Secondary | ICD-10-CM

## 2021-04-04 DIAGNOSIS — Z6835 Body mass index (BMI) 35.0-35.9, adult: Secondary | ICD-10-CM

## 2021-04-04 DIAGNOSIS — E88819 Insulin resistance, unspecified: Secondary | ICD-10-CM

## 2021-04-04 DIAGNOSIS — E66812 Obesity, class 2: Secondary | ICD-10-CM

## 2021-04-04 MED ORDER — TIRZEPATIDE 2.5 MG/0.5ML ~~LOC~~ SOAJ: 2.5000 mg | SUBCUTANEOUS | 0 refills | Status: DC

## 2021-04-04 NOTE — Progress Notes (Signed)
Chief Complaint:   OBESITY Erin Flores is here to discuss her progress with her obesity treatment plan along with follow-up of her obesity related diagnoses. Erin Flores is on keeping a food journal and adhering to recommended goals of 1100-1400 calories and 80 grams protein and states she is following her eating plan approximately 60-70% of the time. Erin Flores states she is not currently exercising.  Today's visit was #: 5 Starting weight: 201 lbs Starting date: 01/23/2021 Today's weight: 189 lbs Today's date: 04/04/2021 Total lbs lost to date: 12 Total lbs lost since last in-office visit: 5  Interim History: Since starting Erin Flores, Erin Flores has had a food aversion. She was sick last week and has been eating beef jerky, protein shakes, and soups. She is not journaling.   Subjective:   1. Insulin resistance Erin Flores denies polyphagia and has no cravings.  2. At risk for deficient intake of food Erin Flores is at risk for deficient intake of food due to food aversion.  Assessment/Plan:   1. Insulin resistance Erin Flores will continue to work on weight loss, exercise, and decreasing simple carbohydrates to help decrease the risk of diabetes. Erin Flores agreed to follow-up with [place] as directed to closely monitor her progress.  Refill- tirzepatide (MOUNJARO) 2.5 MG/0.5ML Pen; Inject 2.5 mg into the skin once a week.  Dispense: 2 mL; Refill: 0  2. At risk for deficient intake of food Erin Flores was given approximately 15 minutes of deficit intake of food prevention counseling today. Erin Flores is at risk for eating too few calories based on current food recall. She was encouraged to focus on meeting caloric and protein goals according to her recommended meal plan.   3. Obesity with current BMI 33.49  Erin Flores is currently in the action stage of change. As such, her goal is to continue with weight loss efforts. She has agreed to keeping a food journal and adhering to recommended goals of 1100-1300 calories and  80 grams protein.   Exercise goals:  As is  Behavioral modification strategies: increasing lean protein intake, no skipping meals, and keeping a strict food journal.  Erin Flores has agreed to follow-up with our clinic in 2 weeks. She was informed of the importance of frequent follow-up visits to maximize her success with intensive lifestyle modifications for her multiple health conditions.   Objective:   Blood pressure 119/79, pulse 85, temperature 98 F (36.7 C), height 5\' 3"  (1.6 m), weight 189 lb (85.7 kg), SpO2 99 %. Body mass index is 33.48 kg/m.  General: Cooperative, alert, well developed, in no acute distress. HEENT: Conjunctivae and lids unremarkable. Cardiovascular: Regular rhythm.  Lungs: Normal work of breathing. Neurologic: No focal deficits.   Lab Results  Component Value Date   CREATININE 0.79 01/23/2021   BUN 9 01/23/2021   NA 137 01/23/2021   K 4.6 01/23/2021   CL 99 01/23/2021   CO2 22 01/23/2021   Lab Results  Component Value Date   ALT 42 (H) 01/23/2021   AST 35 01/23/2021   ALKPHOS 89 01/23/2021   BILITOT 0.4 01/23/2021   Lab Results  Component Value Date   HGBA1C 5.5 01/23/2021   Lab Results  Component Value Date   INSULIN 12.5 01/23/2021   Lab Results  Component Value Date   TSH 3.10 01/28/2021   Lab Results  Component Value Date   CHOL 257 (H) 01/23/2021   HDL 60 01/23/2021   LDLCALC 160 (H) 01/23/2021   TRIG 201 (H) 01/23/2021   CHOLHDL 4 02/24/2020  Lab Results  Component Value Date   VD25OH 51.0 01/23/2021   Lab Results  Component Value Date   WBC 8.2 01/23/2021   HGB 13.8 01/23/2021   HCT 41.1 01/23/2021   MCV 78 (L) 01/23/2021   PLT 389 01/16/2021    Attestation Statements:   Reviewed by clinician on day of visit: allergies, medications, problem list, medical history, surgical history, family history, social history, and previous encounter notes.  Edmund Hilda, CMA, am acting as transcriptionist for [employer], PA-C.  I have reviewed the above documentation for accuracy and completeness, and I agree with the above. Alois Cliche, PA-C

## 2021-04-05 ENCOUNTER — Ambulatory Visit: Payer: No Typology Code available for payment source | Admitting: Psychiatry

## 2021-04-09 ENCOUNTER — Other Ambulatory Visit (INDEPENDENT_AMBULATORY_CARE_PROVIDER_SITE_OTHER): Payer: Self-pay | Admitting: Family Medicine

## 2021-04-09 DIAGNOSIS — E8881 Metabolic syndrome: Secondary | ICD-10-CM

## 2021-04-11 ENCOUNTER — Other Ambulatory Visit (INDEPENDENT_AMBULATORY_CARE_PROVIDER_SITE_OTHER): Payer: Self-pay | Admitting: Physician Assistant

## 2021-04-11 ENCOUNTER — Other Ambulatory Visit (INDEPENDENT_AMBULATORY_CARE_PROVIDER_SITE_OTHER): Payer: Self-pay | Admitting: Emergency Medicine

## 2021-04-11 ENCOUNTER — Other Ambulatory Visit (INDEPENDENT_AMBULATORY_CARE_PROVIDER_SITE_OTHER): Payer: Self-pay | Admitting: Family Medicine

## 2021-04-11 DIAGNOSIS — E8881 Metabolic syndrome: Secondary | ICD-10-CM

## 2021-04-11 MED ORDER — TIRZEPATIDE 2.5 MG/0.5ML ~~LOC~~ SOAJ: 2.5000 mg | SUBCUTANEOUS | 0 refills | Status: DC

## 2021-04-11 NOTE — Telephone Encounter (Signed)
Called [facility] to be sure patient did not pick up medication, Then sent new rx of [facility]o [facility].

## 2021-04-11 NOTE — Telephone Encounter (Signed)
Please review

## 2021-04-11 NOTE — Telephone Encounter (Signed)
Msg below  

## 2021-04-11 NOTE — Telephone Encounter (Signed)
Patient wanted to change pharmacy on this medication. oNCE CHANGE LOCATION THEY NOW STATES PATIENT MUST HAVE A DX OF TYPE II DIABETES

## 2021-04-11 NOTE — Telephone Encounter (Signed)
Last OV with Erin Flores 

## 2021-04-11 NOTE — Telephone Encounter (Signed)
Last OV with Tracey 

## 2021-04-15 ENCOUNTER — Other Ambulatory Visit (INDEPENDENT_AMBULATORY_CARE_PROVIDER_SITE_OTHER): Payer: Self-pay | Admitting: Physician Assistant

## 2021-04-15 DIAGNOSIS — E8881 Metabolic syndrome: Secondary | ICD-10-CM

## 2021-04-17 ENCOUNTER — Other Ambulatory Visit (INDEPENDENT_AMBULATORY_CARE_PROVIDER_SITE_OTHER): Payer: Self-pay | Admitting: Physician Assistant

## 2021-04-17 DIAGNOSIS — E8881 Metabolic syndrome: Secondary | ICD-10-CM

## 2021-04-17 NOTE — Telephone Encounter (Signed)
Spoke with [facility] pharmacy and they state patient must have dx of type II dm. Waiting on return call from Dara with [facility] rep

## 2021-04-17 NOTE — Telephone Encounter (Signed)
Last OV with Tracey 

## 2021-04-18 ENCOUNTER — Other Ambulatory Visit (INDEPENDENT_AMBULATORY_CARE_PROVIDER_SITE_OTHER): Payer: Self-pay | Admitting: Emergency Medicine

## 2021-04-18 ENCOUNTER — Encounter (INDEPENDENT_AMBULATORY_CARE_PROVIDER_SITE_OTHER): Payer: Self-pay | Admitting: Physician Assistant

## 2021-04-18 DIAGNOSIS — E8881 Metabolic syndrome: Secondary | ICD-10-CM

## 2021-04-18 MED ORDER — TIRZEPATIDE 2.5 MG/0.5ML ~~LOC~~ SOAJ: 2.5000 mg | SUBCUTANEOUS | 0 refills | Status: DC

## 2021-04-18 NOTE — Telephone Encounter (Signed)
Since [facility] is not allow [facility] to use her coupon for the [facility] patient wanted to send rx back to the [facility] in [place] so she can [place] her coupon on file. Rx has been sent to [facility]. Patient informed to let me know if she have trouble at the  [facility] pharmacy.

## 2021-04-22 ENCOUNTER — Ambulatory Visit (INDEPENDENT_AMBULATORY_CARE_PROVIDER_SITE_OTHER): Payer: No Typology Code available for payment source | Admitting: Family Medicine

## 2021-04-22 ENCOUNTER — Encounter (INDEPENDENT_AMBULATORY_CARE_PROVIDER_SITE_OTHER): Payer: Self-pay | Admitting: Family Medicine

## 2021-04-22 ENCOUNTER — Other Ambulatory Visit: Payer: Self-pay

## 2021-04-22 VITALS — BP 133/85 | HR 96 | Temp 97.5°F | Ht 63.0 in | Wt 189.0 lb

## 2021-04-22 DIAGNOSIS — F39 Unspecified mood [affective] disorder: Secondary | ICD-10-CM | POA: Diagnosis not present

## 2021-04-22 DIAGNOSIS — I10 Essential (primary) hypertension: Secondary | ICD-10-CM | POA: Diagnosis not present

## 2021-04-22 DIAGNOSIS — E66812 Obesity, class 2: Secondary | ICD-10-CM

## 2021-04-22 DIAGNOSIS — E8881 Metabolic syndrome: Secondary | ICD-10-CM | POA: Diagnosis not present

## 2021-04-22 DIAGNOSIS — E88819 Insulin resistance, unspecified: Secondary | ICD-10-CM

## 2021-04-22 DIAGNOSIS — Z6835 Body mass index (BMI) 35.0-35.9, adult: Secondary | ICD-10-CM

## 2021-04-22 NOTE — Progress Notes (Signed)
Chief Complaint:   OBESITY Erin Flores is here to discuss her progress with her obesity treatment plan along with follow-up of her obesity related diagnoses. Erin Flores is on keeping a food journal and adhering to recommended goals of 1100-1300 calories and 80 grams of protein daily and states she is following her eating plan approximately 90% of the time. Erin Flores states she is at the gym, on the elliptical and doing strengthening for 60 minutes 3 times per week.  Today's visit was #: 6 Starting weight: 201 lbs Starting date: 01/23/2021 Today's weight: 189 lbs Today's date: 04/22/2021 Total lbs lost to date: 12 Total lbs lost since last in-office visit: 0  Interim History: Erin Flores started with Dr. Dalbert Garnet. She is following the journaling plan. She its her calories and protein goals everyday except for Saturday and Sunday, and on those two days she is short just 100 calories. The past 2 weeks she went back to the gym. She is drinking 90-100 oz of water daily.  Subjective:   1. Essential hypertension [place]'s blood pressure is at goal. Her home blood pressure run between 120's/70-80's. She is taking irbesartan. Cardiovascular ROS: no chest pain or dyspnea on exertion.  BP Readings from Last 3 Encounters:  04/22/21 133/85  04/04/21 119/79  03/18/21 131/82   2. Insulin resistance Erin Flores has a diagnosis of insulin resistance based on her elevated fasting insulin level >5. She notes her hunger and cravings are controlled with Mounjaro. She continues to work on diet and exercise to decrease her risk of diabetes.  3. Mood disorder (HCC) with emotional eating Erin Flores has a Psych doctor, Dr. [name] at [facility]. May has possible bipolar (?). She has a history of anxiety, depression, and ADHD.   Assessment/Plan:  No orders of the defined types were placed in this encounter.   Medications Discontinued During This Encounter  Medication Reason   zaleplon (SONATA) 10 MG capsule       No orders of the defined types were placed in this encounter.    1. Essential hypertension Erin Flores will continue her medications, healthy weight loss, and exercise to improve blood pressure control. We will watch for signs of hypotension as she continues her lifestyle modifications.  2. Insulin resistance Erin Flores will continue Mounjaro at her current dose, as she received a refill 5 days ago. We will consider increasing at her next office visit. She will continue to work on weight loss, exercise, and decreasing simple carbohydrates to help decrease the risk of diabetes. Erin Flores agreed to follow-up with [place] as directed to closely monitor her progress.  3. Mood disorder (HCC) with emotional eating Historically Erin Flores has not found a counselor that is helpful. She hasn't used one recently, but I recommended one if she is struggling emotionally. Behavior modification techniques were discussed today to help Erin Flores deal with her emotional/non-hunger eating behaviors. Orders and follow up as documented in patient record.   4. Obesity with current BMI of 33.6 Erin Flores is currently in the action stage of change. As such, her goal is to continue with weight loss efforts. She has agreed to keeping a food journal and adhering to recommended goals of 1100-1300 calories and 80 grams of protein daily.   Erin Flores is to come fasting for labs to her next office visit. She is to bring in her labs from various other specialists.  Exercise goals: As is.  Behavioral modification strategies: keeping a strict food journal.  Erin Flores has agreed to follow-up with our clinic in 2  to 3 weeks. She was informed of the importance of frequent follow-up visits to maximize her success with intensive lifestyle modifications for her multiple health conditions.   Objective:   Blood pressure 133/85, pulse 96, temperature (!) 97.5 F (36.4 C), height 5\' 3"  (1.6 m), weight 189 lb (85.7 kg), SpO2 100 %. Body mass index is 33.48  kg/m.  General: Cooperative, alert, well developed, in no acute distress. HEENT: Conjunctivae and lids unremarkable. Cardiovascular: Regular rhythm.  Lungs: Normal work of breathing. Neurologic: No focal deficits.   Lab Results  Component Value Date   CREATININE 0.79 01/23/2021   BUN 9 01/23/2021   NA 137 01/23/2021   K 4.6 01/23/2021   CL 99 01/23/2021   CO2 22 01/23/2021   Lab Results  Component Value Date   ALT 42 (H) 01/23/2021   AST 35 01/23/2021   ALKPHOS 89 01/23/2021   BILITOT 0.4 01/23/2021   Lab Results  Component Value Date   HGBA1C 5.5 01/23/2021   Lab Results  Component Value Date   INSULIN 12.5 01/23/2021   Lab Results  Component Value Date   TSH 3.10 01/28/2021   Lab Results  Component Value Date   CHOL 257 (H) 01/23/2021   HDL 60 01/23/2021   LDLCALC 160 (H) 01/23/2021   TRIG 201 (H) 01/23/2021   CHOLHDL 4 02/24/2020   Lab Results  Component Value Date   VD25OH 51.0 01/23/2021   Lab Results  Component Value Date   WBC 8.2 01/23/2021   HGB 13.8 01/23/2021   HCT 41.1 01/23/2021   MCV 78 (L) 01/23/2021   PLT 389 01/16/2021   No results found for: IRON, TIBC, FERRITIN  Attestation Statements:   Reviewed by clinician on day of visit: allergies, medications, problem list, medical history, surgical history, family history, social history, and previous encounter notes.   01/18/2021, am acting as transcriptionist for Trude Mcburney, DO.  I have reviewed the above documentation for accuracy and completeness, and I agree with the above. [employer], D.O.  The 21st Century Cures Act was signed into law in 2016 which includes the topic of electronic health records.  This provides immediate access to information in MyChart.  This includes consultation notes, operative notes, office notes, lab results and pathology reports.  If you have any questions about what you read please let 2017 know at your next visit so we can discuss your  concerns and take corrective action if need be.  We are right here with you.

## 2021-05-01 LAB — HM PAP SMEAR: HM Pap smear: NORMAL

## 2021-05-01 LAB — RESULTS CONSOLE HPV: CHL HPV: NEGATIVE

## 2021-05-13 NOTE — Telephone Encounter (Signed)
Will discuss with Dr. Val Eagle tomorrow at visit.

## 2021-05-14 ENCOUNTER — Encounter (INDEPENDENT_AMBULATORY_CARE_PROVIDER_SITE_OTHER): Payer: Self-pay | Admitting: Family Medicine

## 2021-05-14 ENCOUNTER — Ambulatory Visit (INDEPENDENT_AMBULATORY_CARE_PROVIDER_SITE_OTHER): Payer: No Typology Code available for payment source | Admitting: Family Medicine

## 2021-05-14 ENCOUNTER — Other Ambulatory Visit: Payer: Self-pay

## 2021-05-14 VITALS — BP 109/72 | HR 99 | Temp 97.8°F | Ht 63.0 in | Wt 192.0 lb

## 2021-05-14 DIAGNOSIS — Z9189 Other specified personal risk factors, not elsewhere classified: Secondary | ICD-10-CM

## 2021-05-14 DIAGNOSIS — K760 Fatty (change of) liver, not elsewhere classified: Secondary | ICD-10-CM

## 2021-05-14 DIAGNOSIS — I10 Essential (primary) hypertension: Secondary | ICD-10-CM

## 2021-05-14 DIAGNOSIS — E66812 Obesity, class 2: Secondary | ICD-10-CM

## 2021-05-14 DIAGNOSIS — E782 Mixed hyperlipidemia: Secondary | ICD-10-CM | POA: Diagnosis not present

## 2021-05-14 DIAGNOSIS — E8881 Metabolic syndrome: Secondary | ICD-10-CM

## 2021-05-14 DIAGNOSIS — D509 Iron deficiency anemia, unspecified: Secondary | ICD-10-CM | POA: Insufficient documentation

## 2021-05-14 DIAGNOSIS — E88819 Insulin resistance, unspecified: Secondary | ICD-10-CM

## 2021-05-14 DIAGNOSIS — Z6835 Body mass index (BMI) 35.0-35.9, adult: Secondary | ICD-10-CM

## 2021-05-14 MED ORDER — TIRZEPATIDE 5 MG/0.5ML ~~LOC~~ SOAJ: 5.0000 mg | SUBCUTANEOUS | 0 refills | Status: DC

## 2021-05-14 NOTE — Progress Notes (Signed)
Chief Complaint:   OBESITY Erin Flores is here to discuss her progress with her obesity treatment plan along with follow-up of her obesity related diagnoses. Erin Flores is on keeping a food journal and adhering to recommended goals of 1100-1300 calories and 80 grams protein and states she is following her eating plan approximately 95% of the time. Erin Flores states she is going to the gym 60 minutes 3-4 times per week.  Today's visit was #: 7 Starting weight: 201 lbs Starting date: 01/23/2021 Today's weight: 192 lbs Today's date: 05/14/2021 Total lbs lost to date: 9 Total lbs lost since last in-office visit: +3  Interim History: Erin Flores is frustrated that she is not losing weight and feels she is eating everything she should.  Subjective:   1. Mixed hyperlipidemia Pt has history of elevated LDL and triglycerides. Medication: None  2. Insulin resistance No help with Mounjaro. Pt feels hunger throughout the day. She is on journaling plan but not tracking calories or protein.  3. Essential hypertension Pt's BP is much improved with change to BP medication.  4. NAFLD (nonalcoholic fatty liver disease) She has a history of elevated ALT.  5. Iron deficiency anemia, unspecified iron deficiency anemia type Pt does not take an iron supplement.  6. At risk for side effect of medication Erin Flores is at risk for side effects of medication due to increasing dose of Mounjaro.  Assessment/Plan:   Orders Placed This Encounter  Procedures   Iron and TIBC   Ferritin   Lipid panel   Insulin, random   Hemoglobin A1c   Comprehensive metabolic panel   CBC with Differential/Platelet    Medications Discontinued During This Encounter  Medication Reason   Eszopiclone 3 MG TABS    norethindrone-ethinyl estradiol-FE (LOESTRIN FE) 1-20 MG-MCG tablet    tirzepatide (MOUNJARO) 2.5 MG/0.5ML Pen      Meds ordered this encounter  Medications   tirzepatide (MOUNJARO) 5 MG/0.5ML Pen    Sig: Inject 5  mg into the skin once a week.    Dispense:  2 mL    Refill:  0     1. Mixed hyperlipidemia Cardiovascular risk and specific lipid/LDL goals reviewed.  We discussed several lifestyle modifications today and Erin Flores will continue to work on diet, exercise and weight loss efforts. Orders and follow up as documented in patient record.   Counseling Intensive lifestyle modifications are the first line treatment for this issue. Dietary changes: Increase soluble fiber. Decrease simple carbohydrates. Exercise changes: Moderate to vigorous-intensity aerobic activity 150 minutes per week if tolerated. Lipid-lowering medications: see documented in medical record. Check labs today.  - Lipid panel - Comprehensive metabolic panel  2. Insulin resistance Erin Flores will increase Mounjaro to 5 mg and continue to work on weight loss, exercise, and decreasing simple carbohydrates to help decrease the risk of diabetes. Erin Flores agreed to follow-up with [place] as directed to closely monitor her progress. Check labs today.  - Insulin, random - Hemoglobin A1c  Increase & Refill- tirzepatide (MOUNJARO) 5 MG/0.5ML Pen; Inject 5 mg into the skin once a week.  Dispense: 2 mL; Refill: 0  3. Essential hypertension At goal now. Erin Flores is working on healthy weight loss and exercise to improve blood pressure control. We will watch for signs of hypotension as she continues her lifestyle modifications. Continue Avapro.  4. NAFLD (nonalcoholic fatty liver disease) We discussed the likely diagnosis of non-alcoholic fatty liver disease today and how this condition is obesity related. Erin Flores was educated the importance of weight  loss. Erin Flores agreed to continue with her weight loss efforts with healthier diet and exercise as an essential part of her treatment plan. Check labs today.  - Comprehensive metabolic panel  5. Iron deficiency anemia, unspecified iron deficiency anemia type Check labs today.  - Iron and TIBC -  Ferritin - CBC with Differential/Platelet  6. At risk for side effect of medication Due to Erin Flores's current conditions and medications, she is at a higher risk for drug side effect.  At least 10 minutes was spent on counseling her about these concerns today.  We discussed the benefits and potential risks of these medications, and all of patient's concerns were addressed and questions were answered.  she will call us, or their PCP or other specialists who treat their conditions with medications, with any questions or concerns that may develop.    7. Obesity with current BMI of 34.1  Erin Flores is currently in the action stage of change. As such, her goal is to continue with weight loss efforts. She has agreed to change to keeping a food journal and adhering to recommended goals of 1000-1100 calories and 90+ grams protein.   Pt strongly encouraged to bring in log/journaling totals of daily protein and calories.   Exercise goals:  As is  Behavioral modification strategies: holiday eating strategies .  Erin Flores has agreed to follow-up with our clinic in 3 weeks- first week of January with first available provider since we increased dose of medication, pt needs closer monitoring. She was informed of the importance of frequent follow-up visits to maximize her success with intensive lifestyle modifications for her multiple health conditions.   Erin Flores was informed we would discuss her lab results at her next visit unless there is a critical issue that needs to be addressed sooner. Erin Flores agreed to keep her next visit at the agreed upon time to discuss these results.  Objective:   Blood pressure 109/72, pulse 99, temperature 97.8 F (36.6 C), height 5\' 3"  (1.6 m), weight 192 lb (87.1 kg), SpO2 99 %. Body mass index is 34.01 kg/m.  General: Cooperative, alert, well developed, in no acute distress. HEENT: Conjunctivae and lids unremarkable. Cardiovascular: Regular rhythm.  Lungs: Normal work of  breathing. Neurologic: No focal deficits.   Lab Results  Component Value Date   CREATININE 0.79 01/23/2021   BUN 9 01/23/2021   NA 137 01/23/2021   K 4.6 01/23/2021   CL 99 01/23/2021   CO2 22 01/23/2021   Lab Results  Component Value Date   ALT 42 (H) 01/23/2021   AST 35 01/23/2021   ALKPHOS 89 01/23/2021   BILITOT 0.4 01/23/2021   Lab Results  Component Value Date   HGBA1C 5.5 01/23/2021   Lab Results  Component Value Date   INSULIN 12.5 01/23/2021   Lab Results  Component Value Date   TSH 3.10 01/28/2021   Lab Results  Component Value Date   CHOL 257 (H) 01/23/2021   HDL 60 01/23/2021   LDLCALC 160 (H) 01/23/2021   TRIG 201 (H) 01/23/2021   CHOLHDL 4 02/24/2020   Lab Results  Component Value Date   VD25OH 51.0 01/23/2021   Lab Results  Component Value Date   WBC 8.2 01/23/2021   HGB 13.8 01/23/2021   HCT 41.1 01/23/2021   MCV 78 (L) 01/23/2021   PLT 389 01/16/2021    Attestation Statements:   Reviewed by clinician on day of visit: allergies, medications, problem list, medical history, surgical history, family history, social history, and  previous encounter notes.  Coral Ceo, CMA, am acting as transcriptionist for [employer], DO.  I have reviewed the above documentation for accuracy and completeness, and I agree with the above. Marjory Sneddon, D.O.  The [place] was signed into law in 2016 which includes the topic of electronic health records.  This provides immediate access to information in MyChart.  This includes consultation notes, operative notes, office notes, lab results and pathology reports.  If you have any questions about what you read please let us know at your next visit so we can discuss your concerns and take corrective action if need be.  We are right here with you.

## 2021-05-15 LAB — CBC WITH DIFFERENTIAL/PLATELET
Basophils Absolute: 0 10*3/uL (ref 0.0–0.2)
Basos: 0 %
EOS (ABSOLUTE): 0.1 10*3/uL (ref 0.0–0.4)
Eos: 1 %
Hematocrit: 36.3 % (ref 34.0–46.6)
Hemoglobin: 11.5 g/dL (ref 11.1–15.9)
Immature Grans (Abs): 0.1 10*3/uL (ref 0.0–0.1)
Immature Granulocytes: 1 %
Lymphocytes Absolute: 3.1 10*3/uL (ref 0.7–3.1)
Lymphs: 31 %
MCH: 26.9 pg (ref 26.6–33.0)
MCHC: 31.7 g/dL (ref 31.5–35.7)
MCV: 85 fL (ref 79–97)
Monocytes Absolute: 0.6 10*3/uL (ref 0.1–0.9)
Monocytes: 6 %
Neutrophils Absolute: 6.2 10*3/uL (ref 1.4–7.0)
Neutrophils: 61 %
Platelets: 375 10*3/uL (ref 150–450)
RBC: 4.27 x10E6/uL (ref 3.77–5.28)
RDW: 15.6 % — ABNORMAL HIGH (ref 11.7–15.4)
WBC: 10 10*3/uL (ref 3.4–10.8)

## 2021-05-15 LAB — LIPID PANEL
Chol/HDL Ratio: 5.5 ratio — ABNORMAL HIGH (ref 0.0–4.4)
Cholesterol, Total: 264 mg/dL — ABNORMAL HIGH (ref 100–199)
HDL: 48 mg/dL (ref >39)
LDL Chol Calc (NIH): 162 mg/dL — ABNORMAL HIGH (ref 0–99)
Triglycerides: 288 mg/dL — ABNORMAL HIGH (ref 0–149)
VLDL Cholesterol Cal: 54 mg/dL — ABNORMAL HIGH (ref 5–40)

## 2021-05-15 LAB — COMPREHENSIVE METABOLIC PANEL
ALT: 69 IU/L — ABNORMAL HIGH (ref 0–32)
AST: 36 IU/L (ref 0–40)
Albumin/Globulin Ratio: 1.7 (ref 1.2–2.2)
Albumin: 4.5 g/dL (ref 3.8–4.8)
Alkaline Phosphatase: 96 IU/L (ref 44–121)
BUN/Creatinine Ratio: 19 (ref 9–23)
BUN: 16 mg/dL (ref 6–20)
Bilirubin Total: 0.4 mg/dL (ref 0.0–1.2)
CO2: 25 mmol/L (ref 20–29)
Calcium: 9.2 mg/dL (ref 8.7–10.2)
Chloride: 101 mmol/L (ref 96–106)
Creatinine, Ser: 0.85 mg/dL (ref 0.57–1.00)
Globulin, Total: 2.6 g/dL (ref 1.5–4.5)
Glucose: 83 mg/dL (ref 70–99)
Potassium: 4.6 mmol/L (ref 3.5–5.2)
Sodium: 139 mmol/L (ref 134–144)
Total Protein: 7.1 g/dL (ref 6.0–8.5)
eGFR: 90 mL/min/{1.73_m2} (ref >59)

## 2021-05-15 LAB — IRON AND TIBC
Iron Saturation: 17 % (ref 15–55)
Iron: 94 ug/dL (ref 27–159)
Total Iron Binding Capacity: 539 ug/dL — ABNORMAL HIGH (ref 250–450)
UIBC: 445 ug/dL — ABNORMAL HIGH (ref 131–425)

## 2021-05-15 LAB — HEMOGLOBIN A1C
Est. average glucose Bld gHb Est-mCnc: 105 mg/dL
Hgb A1c MFr Bld: 5.3 % (ref 4.8–5.6)

## 2021-05-15 LAB — FERRITIN: Ferritin: 21 ng/mL (ref 15–150)

## 2021-05-15 LAB — INSULIN, RANDOM: INSULIN: 6.8 u[IU]/mL (ref 2.6–24.9)

## 2021-06-05 ENCOUNTER — Ambulatory Visit (INDEPENDENT_AMBULATORY_CARE_PROVIDER_SITE_OTHER): Payer: No Typology Code available for payment source | Admitting: Physician Assistant

## 2021-06-26 ENCOUNTER — Emergency Department (HOSPITAL_BASED_OUTPATIENT_CLINIC_OR_DEPARTMENT_OTHER): Payer: No Typology Code available for payment source

## 2021-06-26 ENCOUNTER — Encounter (HOSPITAL_BASED_OUTPATIENT_CLINIC_OR_DEPARTMENT_OTHER): Payer: Self-pay | Admitting: Emergency Medicine

## 2021-06-26 ENCOUNTER — Emergency Department (HOSPITAL_BASED_OUTPATIENT_CLINIC_OR_DEPARTMENT_OTHER)
Admission: EM | Admit: 2021-06-26 | Discharge: 2021-06-26 | Disposition: A | Discharge status: Home / Self Care | Payer: No Typology Code available for payment source | Attending: Emergency Medicine | Admitting: Emergency Medicine

## 2021-06-26 ENCOUNTER — Other Ambulatory Visit: Payer: Self-pay

## 2021-06-26 DIAGNOSIS — I10 Essential (primary) hypertension: Secondary | ICD-10-CM | POA: Insufficient documentation

## 2021-06-26 DIAGNOSIS — R110 Nausea: Secondary | ICD-10-CM | POA: Diagnosis not present

## 2021-06-26 DIAGNOSIS — D649 Anemia, unspecified: Secondary | ICD-10-CM | POA: Insufficient documentation

## 2021-06-26 DIAGNOSIS — R1031 Right lower quadrant pain: Secondary | ICD-10-CM | POA: Diagnosis present

## 2021-06-26 LAB — CBC WITH DIFFERENTIAL/PLATELET
Abs Immature Granulocytes: 0.05 10*3/uL (ref 0.00–0.07)
Basophils Absolute: 0 10*3/uL (ref 0.0–0.1)
Basophils Relative: 0 %
Eosinophils Absolute: 0.1 10*3/uL (ref 0.0–0.5)
Eosinophils Relative: 1 %
HCT: 33.1 % — ABNORMAL LOW (ref 36.0–46.0)
Hemoglobin: 10.7 g/dL — ABNORMAL LOW (ref 12.0–15.0)
Immature Granulocytes: 1 %
Lymphocytes Relative: 26 %
Lymphs Abs: 2.5 10*3/uL (ref 0.7–4.0)
MCH: 26.7 pg (ref 26.0–34.0)
MCHC: 32.3 g/dL (ref 30.0–36.0)
MCV: 82.5 fL (ref 80.0–100.0)
Monocytes Absolute: 0.7 10*3/uL (ref 0.1–1.0)
Monocytes Relative: 7 %
Neutro Abs: 6.5 10*3/uL (ref 1.7–7.7)
Neutrophils Relative %: 65 %
Platelets: 312 10*3/uL (ref 150–400)
RBC: 4.01 MIL/uL (ref 3.87–5.11)
RDW: 15 % (ref 11.5–15.5)
WBC: 10 10*3/uL (ref 4.0–10.5)
nRBC: 0 % (ref 0.0–0.2)

## 2021-06-26 LAB — COMPREHENSIVE METABOLIC PANEL
ALT: 64 U/L — ABNORMAL HIGH (ref 0–44)
AST: 34 U/L (ref 15–41)
Albumin: 3.9 g/dL (ref 3.5–5.0)
Alkaline Phosphatase: 72 U/L (ref 38–126)
Anion gap: 9 (ref 5–15)
BUN: 16 mg/dL (ref 6–20)
CO2: 25 mmol/L (ref 22–32)
Calcium: 8.6 mg/dL — ABNORMAL LOW (ref 8.9–10.3)
Chloride: 101 mmol/L (ref 98–111)
Creatinine, Ser: 0.78 mg/dL (ref 0.44–1.00)
GFR, Estimated: >60 mL/min (ref >60)
Glucose, Bld: 85 mg/dL (ref 70–99)
Potassium: 4 mmol/L (ref 3.5–5.1)
Sodium: 135 mmol/L (ref 135–145)
Total Bilirubin: 0.5 mg/dL (ref 0.3–1.2)
Total Protein: 7.1 g/dL (ref 6.5–8.1)

## 2021-06-26 LAB — PREGNANCY, URINE: Preg Test, Ur: NEGATIVE

## 2021-06-26 LAB — URINALYSIS, ROUTINE W REFLEX MICROSCOPIC
Bilirubin Urine: NEGATIVE
Glucose, UA: NEGATIVE mg/dL
Hgb urine dipstick: NEGATIVE
Ketones, ur: NEGATIVE mg/dL
Leukocytes,Ua: NEGATIVE
Nitrite: NEGATIVE
Protein, ur: NEGATIVE mg/dL
Specific Gravity, Urine: 1.01 (ref 1.005–1.030)
pH: 6 (ref 5.0–8.0)

## 2021-06-26 LAB — LIPASE, BLOOD: Lipase: 30 U/L (ref 11–51)

## 2021-06-26 MED ORDER — SODIUM CHLORIDE 0.9 % IV BOLUS
1000.0000 mL | Freq: Once | INTRAVENOUS | Status: AC
  Administered 2021-06-26: 13:00:00 1000 mL via INTRAVENOUS

## 2021-06-26 MED ORDER — FENTANYL CITRATE PF 50 MCG/ML IJ SOSY
50.0000 ug | PREFILLED_SYRINGE | Freq: Once | INTRAMUSCULAR | Status: AC
  Administered 2021-06-26: 13:00:00 50 ug via INTRAVENOUS
  Filled 2021-06-26: qty 1

## 2021-06-26 MED ORDER — ONDANSETRON 8 MG PO TBDP: 8.0000 mg | ORAL_TABLET | Freq: Three times a day (TID) | ORAL | 0 refills | Status: DC | PRN

## 2021-06-26 MED ORDER — ONDANSETRON HCL 4 MG/2ML IJ SOLN
4.0000 mg | Freq: Once | INTRAMUSCULAR | Status: AC
  Administered 2021-06-26: 13:00:00 4 mg via INTRAVENOUS
  Filled 2021-06-26: qty 2

## 2021-06-26 MED ORDER — IOHEXOL 300 MG/ML  SOLN
100.0000 mL | Freq: Once | INTRAMUSCULAR | Status: AC | PRN
  Administered 2021-06-26: 14:00:00 100 mL via INTRAVENOUS

## 2021-06-26 NOTE — ED Notes (Signed)
Pt transported to CT ?

## 2021-06-26 NOTE — ED Triage Notes (Signed)
Pt reports she woke up last night twice with RLQ/flank pain and nausea that lasted an hour. Pt has had issues with alternating diarrhea and constipation the past few weeks.

## 2021-06-26 NOTE — ED Provider Notes (Signed)
[facility] EMERGENCY DEPARTMENT Provider Note   CSN: [id number] Arrival date & time: 06/26/21  1230     History  Chief Complaint  Patient presents with   Abdominal Pain    Erin Flores is a 36 y.o. female.   Abdominal Pain  Patient with history of anxiety, ADD, bipolar, GERD, hypertension, psoriatic arthritis presents today with right lower quadrant abdominal pain.  Started last night, associated with nausea but no emesis.  It was right lower quadrant with radiation to the left lower quadrant across her lower abdomen, felt like a sharp pain and did not radiate elsewhere.  This improved with sleep, woke up with the pain still present but less severe.  It is localized to the right lower quadrant now, is worse when she walks.  Denies emesis, dysuria, hematuria, fevers, vaginal pain, vaginal discharge  Surgical history notable for cholecystectomy in 2015.  Home Medications Prior to Admission medications   Medication Sig Start Date End Date Taking? Authorizing Provider  ALPRAZolam Duanne Moron) 1 MG tablet Take 1 mg by mouth 2 (two) times daily as needed. 01/27/21  Yes [provider]  amoxicillin (AMOXIL) 250 MG capsule Take 250 mg by mouth 3 (three) times daily.   Yes [provider]  amphetamine-dextroamphetamine (ADDERALL) 20 MG tablet Take 20 mg by mouth daily.   Yes [provider]  escitalopram (LEXAPRO) 10 MG tablet Take 10 mg by mouth daily.   Yes [provider]  irbesartan (AVAPRO) 150 MG tablet Take 1 tablet (150 mg total) by mouth daily. 02/22/21  Yes McGowen, Adrian Blackwater, MD  ondansetron (ZOFRAN-ODT) 8 MG disintegrating tablet Take 1 tablet (8 mg total) by mouth every 8 (eight) hours as needed for nausea or vomiting. 06/26/21  Yes Sherrill Raring, PA-C  traZODone (DESYREL) 100 MG tablet Take 100 mg by mouth at bedtime.   Yes [provider]  norethindrone (MICRONOR) 0.35 MG tablet Take 1 tablet by mouth daily.    [provider]  tirzepatide Darcel Bayley) 5 MG/0.5ML Pen Inject 5 mg into the skin once a week. 05/14/21   Mellody Dance, DO  Upadacitinib ER 30 MG TB24 Take by mouth.    [provider]      Allergies    Dilaudid [hydromorphone hcl] and Simponi [golimumab]    Review of Systems   Review of Systems  Gastrointestinal:  Positive for abdominal pain.   Physical Exam Updated Vital Signs BP 134/82    Pulse 93    Temp 98 F (36.7 C) (Oral)    Resp 16    Ht 5\' 3"  (1.6 m)    Wt 95.2 kg    LMP 06/19/2021 Comment: neg upreg in er today.   SpO2 100%    BMI 37.18 kg/m  Physical Exam Vitals and nursing note reviewed. Exam conducted with a chaperone present.  Constitutional:      Appearance: Normal appearance. She is obese.     Comments: BMI 37.18  HENT:     Head: Normocephalic and atraumatic.  Eyes:     General: No scleral icterus.       Right eye: No discharge.        Left eye: No discharge.     Extraocular Movements: Extraocular movements intact.     Pupils: Pupils are equal, round, and reactive to light.  Cardiovascular:     Rate and Rhythm: Normal rate and regular rhythm.     Pulses: Normal pulses.     Heart sounds: Normal  heart sounds. No murmur heard.   No friction rub. No gallop.  Pulmonary:     Effort: Pulmonary effort is normal. No respiratory distress.     Breath sounds: Normal breath sounds.  Abdominal:     General: Abdomen is flat. Bowel sounds are normal. There is no distension.     Palpations: Abdomen is soft.     Tenderness: There is abdominal tenderness in the right lower quadrant. Negative signs include Murphy's sign and Rovsing's sign.  Skin:    General: Skin is warm and dry.     Coloration: Skin is not jaundiced.  Neurological:     Mental Status: She is alert. Mental status is at baseline.     Coordination: Coordination normal.    ED Results / Procedures / Treatments   Labs (all labs ordered are listed, but only abnormal results are displayed) Labs  Reviewed  CBC WITH DIFFERENTIAL/PLATELET - Abnormal; Notable for the following components:      Result Value   Hemoglobin 10.7 (*)    HCT 33.1 (*)    All other components within normal limits  COMPREHENSIVE METABOLIC PANEL - Abnormal; Notable for the following components:   Calcium 8.6 (*)    ALT 64 (*)    All other components within normal limits  LIPASE, BLOOD  URINALYSIS, ROUTINE W REFLEX MICROSCOPIC  PREGNANCY, URINE    EKG None  Radiology CT Abdomen Pelvis W Contrast  Result Date: 06/26/2021 CLINICAL DATA:  Right lower quadrant abdominal pain beginning last night. Nausea. EXAM: CT ABDOMEN AND PELVIS WITH CONTRAST TECHNIQUE: Multidetector CT imaging of the abdomen and pelvis was performed using the standard protocol following bolus administration of intravenous contrast. RADIATION DOSE REDUCTION: This exam was performed according to the departmental dose-optimization program which includes automated exposure control, adjustment of the mA and/or kV according to patient size and/or use of iterative reconstruction technique. CONTRAST:  141mL OMNIPAQUE IOHEXOL 300 MG/ML  SOLN COMPARISON:  CT stone study 04/12/2020. Pelvic ultrasound 01/04/2020. FINDINGS: Lower chest: The lung bases are clear without focal nodule, mass, or airspace disease. Hepatobiliary: No focal liver abnormality is seen. Status post cholecystectomy. No biliary dilatation. Pancreas: Unremarkable. No pancreatic ductal dilatation or surrounding inflammatory changes. Spleen: Normal in size without focal abnormality. Adrenals/Urinary Tract: Adrenal glands are normal bilaterally. Kidneys and ureters are unremarkable. No stone or mass lesion is present. No obstruction is present. The urinary bladder is within normal limits. Stomach/Bowel: Stomach is within normal limits. Appendix appears normal. No evidence of bowel wall thickening, distention, or inflammatory changes. Vascular/Lymphatic: No significant vascular findings are present.  No enlarged abdominal or pelvic lymph nodes. Reproductive: Uterus and bilateral adnexa are unremarkable. Other: No abdominal wall hernia or abnormality. No abdominopelvic ascites. Musculoskeletal: No acute or significant osseous findings. IMPRESSION: 1. No acute or focal lesion to explain the patient's symptoms. Normal appearance of the appendix. 2. Status post cholecystectomy. Electronically Signed   By: San Morelle M.D.   On: 06/26/2021 14:46    Procedures Procedures    Medications Ordered in ED Medications  sodium chloride 0.9 % bolus 1,000 mL (0 mLs Intravenous Stopped 06/26/21 1508)  ondansetron (ZOFRAN) injection 4 mg (4 mg Intravenous Given 06/26/21 1322)  fentaNYL (SUBLIMAZE) injection 50 mcg (50 mcg Intravenous Given 06/26/21 1322)  iohexol (OMNIPAQUE) 300 MG/ML solution 100 mL (100 mLs Intravenous Contrast Given 06/26/21 1425)    ED Course/ Medical Decision Making/ A&P  Medical Decision Making Amount and/or Complexity of Data Reviewed Labs: ordered. Radiology: ordered.  Risk Prescription drug management.   This patient presents to the ED for concern of RLQ abdominal pain, this involves an extensive number of treatment options, and is a complaint that carries with it a high risk of complications and morbidity.  The differential diagnosis includes appendicitis, nephrolithiasis, ovarian cyst, PID, pyelonephritis, abscess, enterocolitis, peritonitis, gastroparesis from DM, atypical constipation, other   Additional history obtained: -Additional history obtained from Chart review.   I reviewed her prior imaging.  Patient had CT renal study done on 04/12/2020 that was unrevealing.  Pelvic ultrasound completed 01/04/2020 which was also unrevealing, CT scan with contrast of the abdomen pelvis on 01/04/2020 also unrevealing.  Of note, there has been evidence of constipation on most of her prior imaging.  -External records from outside source obtained and  reviewed including: Chart review including previous notes, labs, imaging, consultation notes   Lab Tests: -I ordered, reviewed, and interpreted labs.  The pertinent results include:  No leukocytosis, no gross electrolyte derangement.  She is slightly aneNo leukocytosis, no gross electrolyte derangement.  She is slightly anemic at 10.7, consistent with recent menstrual cycle.  Lipase low, not consistent with pancreatitis.  UA without any evidence of cystitis or hemoglobin concerning for nephrolithiasis.mic at 10.7, consistent with recent menstrual cycle.  Lipase low, not consistent with pancreatitis.  UA without any evidence of cystitis or hemoglobin concerning for nephrolithiasis.   Imaging Studies ordered: -I ordered imaging studies including CT abdomen pelvis with contrast -I independently visualized and interpreted imaging which showed no evidence of appendicitis, no acute findings to explain her current symptoms. -I agree with the radiologist interpretation   Medicines ordered and prescription drug management: -I ordered medication including fentanyl, Zofran, fluid for nausea and abdominal pain -Reevaluation of the patient after these medicines showed that the patient stayed the same -I have reviewed the patients home medicines and have made adjustments as needed   ED Course: Patient presented with right lower quadrant tenderness without any rebound or CVA tenderness.  Her vitals are stable, not febrile.  There is no peritoneal signs or guarding, CT abdomen does not show any acute process.  Doubt torsion given the constant nature of the pain, doubt intra-abdominal abscess given not febrile and no leukocytosis.  Doubt peritonitis, nephrolithiasis, acute process requiring additional emergent work-up at this time.    Vitals are stable, repeat lab work-up is unremarkable.  I discussed the broad differential with the patient and advised her to follow-up with GI given this is the third time in  roughly 1 year she has had a CT imaging of the abdomen without any acute findings.  Patient is in agreement.  She was discharged in stable condition.    Cardiac Monitoring: The patient was maintained on a cardiac monitor.  I personally viewed and interpreted the cardiac monitored which showed an underlying rhythm of: NSR   Reevaluation: After the interventions noted above, I reevaluated the patient and found that they have :stayed the same   Dispostion: Follow up with GI.          Final Clinical Impression(s) / ED Diagnoses Final diagnoses:  Right lower quadrant abdominal pain    Rx / DC Orders ED Discharge Orders          Ordered    ondansetron (ZOFRAN-ODT) 8 MG disintegrating tablet  Every 8 hours PRN        06/26/21 1505  Sherrill Raring, PA-C 06/26/21 [place], [place], DO 06/29/21 [phone]

## 2021-06-26 NOTE — Discharge Instructions (Addendum)
F/U with [facility] gastroenterology  Take Tylenol and Motrin as needed for pain. Nausea medicine prescribed, take as needed. Work-up today was reassuring, although we do not see any evidence on the CT scan that would be the cause of your pain I do feel you need to follow-up with a specialist.

## 2021-07-15 ENCOUNTER — Other Ambulatory Visit: Payer: Self-pay

## 2021-07-15 ENCOUNTER — Encounter (INDEPENDENT_AMBULATORY_CARE_PROVIDER_SITE_OTHER): Payer: Self-pay | Admitting: Family Medicine

## 2021-07-15 ENCOUNTER — Ambulatory Visit (INDEPENDENT_AMBULATORY_CARE_PROVIDER_SITE_OTHER): Payer: No Typology Code available for payment source | Admitting: Family Medicine

## 2021-07-15 VITALS — BP 137/70 | HR 116 | Temp 98.3°F | Ht 63.0 in | Wt 208.0 lb

## 2021-07-15 DIAGNOSIS — Z6836 Body mass index (BMI) 36.0-36.9, adult: Secondary | ICD-10-CM

## 2021-07-15 DIAGNOSIS — E8881 Metabolic syndrome: Secondary | ICD-10-CM | POA: Diagnosis not present

## 2021-07-15 DIAGNOSIS — K5900 Constipation, unspecified: Secondary | ICD-10-CM | POA: Diagnosis not present

## 2021-07-15 DIAGNOSIS — E669 Obesity, unspecified: Secondary | ICD-10-CM | POA: Diagnosis not present

## 2021-07-15 DIAGNOSIS — Z6835 Body mass index (BMI) 35.0-35.9, adult: Secondary | ICD-10-CM

## 2021-07-15 MED ORDER — TIRZEPATIDE 5 MG/0.5ML ~~LOC~~ SOAJ: 5.0000 mg | SUBCUTANEOUS | 0 refills | Status: DC

## 2021-07-15 NOTE — Progress Notes (Signed)
Chief Complaint:   OBESITY Erin Flores is here to discuss her progress with her obesity treatment plan along with follow-up of her obesity related diagnoses. Erin Flores is on the Category 2 Plan and keeping a food journal and adhering to recommended goals of 1000-1100 calories and 90 grams of protein and states she is following her eating plan approximately 99% of the time. Erin Flores states she is doing cardio and strength for 60 minutes 5 times per week.  Today's visit was #: 8 Starting weight: 201 lbs Starting date: 01/23/2021 Today's weight: 208 lbs Today's date: 07/15/2021 Total lbs lost to date: 0 Total lbs lost since last in-office visit: +16  Interim History: Erin Flores notes she has been "off the wagon" since last office visit on 05-19-2021. She notes a bit of depression over Christmas. She started back on Category 2 last week and things are going well. She feels much better being back on track. She is also going back to the gym. Her water intake is good.   Subjective:   1. Insulin resistance Erin Flores's last A1C was 5.3. She had an injection of Mounjaro 1.5 weeks ago.   Lab Results  Component Value Date   HGBA1C 5.3 05/14/2021   Lab Results  Component Value Date   INSULIN 6.8 05/14/2021   INSULIN 12.5 01/23/2021    2. Constipation, unspecified constipation type Erin Flores notes constipation is an issue for her. She notes Miralax and Metamucil don't work. She has been on Linzess in the past. Her vegetable and water intake is good.   Assessment/Plan:   1. Insulin resistance We will refill Mounjaro 5 mg weekly.  - tirzepatide [facility]) 5 MG/0.5ML Pen; Inject 5 mg into the skin once a week.  Dispense: 2 mL; Refill: 0  2. Constipation, unspecified constipation type Erin Flores will start stool softener daily. She will add Miralax 2 times daily if stool softener doesn't work. We may consider adding Linzess if needed.   3. Obesity with current BMI of 36.85 Erin Flores is currently in the  action stage of change. As such, her goal is to continue with weight loss efforts. She has agreed to the Category 2 Plan.   Exercise goals:  As is.  Behavioral modification strategies: increasing lean protein intake, meal planning and cooking strategies, and planning for success.  Ayeshia has agreed to follow-up with our clinic in 3 weeks with Abby Potash, PA-C or Dr. Raliegh Scarlet.   Objective:   Blood pressure 137/70, pulse (!) 116, temperature 98.3 F (36.8 C), height 5\' 3"  (1.6 m), weight 208 lb (94.3 kg), last menstrual period 06/19/2021, SpO2 98 %. Body mass index is 36.85 kg/m.  General: Cooperative, alert, well developed, in no acute distress. HEENT: Conjunctivae and lids unremarkable. Cardiovascular: Regular rhythm.  Lungs: Normal work of breathing. Neurologic: No focal deficits.   Lab Results  Component Value Date   CREATININE 0.78 06/26/2021   BUN 16 06/26/2021   NA 135 06/26/2021   K 4.0 06/26/2021   CL 101 06/26/2021   CO2 25 06/26/2021   Lab Results  Component Value Date   ALT 64 (H) 06/26/2021   AST 34 06/26/2021   ALKPHOS 72 06/26/2021   BILITOT 0.5 06/26/2021   Lab Results  Component Value Date   HGBA1C 5.3 05/14/2021   HGBA1C 5.5 01/23/2021   Lab Results  Component Value Date   INSULIN 6.8 05/14/2021   INSULIN 12.5 01/23/2021   Lab Results  Component Value Date   TSH 3.10 01/28/2021   Lab  Results  Component Value Date   CHOL 264 (H) 05/14/2021   HDL 48 05/14/2021   LDLCALC 162 (H) 05/14/2021   TRIG 288 (H) 05/14/2021   CHOLHDL 5.5 (H) 05/14/2021   Lab Results  Component Value Date   VD25OH 51.0 01/23/2021   Lab Results  Component Value Date   WBC 10.0 06/26/2021   HGB 10.7 (L) 06/26/2021   HCT 33.1 (L) 06/26/2021   MCV 82.5 06/26/2021   PLT 312 06/26/2021   Lab Results  Component Value Date   IRON 94 05/14/2021   TIBC 539 (H) 05/14/2021   FERRITIN 21 05/14/2021   Attestation Statements:   Reviewed by clinician on day of  visit: allergies, medications, problem list, medical history, surgical history, family history, social history, and previous encounter notes.  I, Lizbeth Bark, RMA, am acting as Location manager for [employer], [place].  I have reviewed the above documentation for accuracy and completeness, and I agree with the above. -  Georgianne Fick, FNP

## 2021-07-16 ENCOUNTER — Encounter (INDEPENDENT_AMBULATORY_CARE_PROVIDER_SITE_OTHER): Payer: Self-pay | Admitting: Family Medicine

## 2021-07-31 ENCOUNTER — Other Ambulatory Visit (HOSPITAL_BASED_OUTPATIENT_CLINIC_OR_DEPARTMENT_OTHER): Payer: Self-pay

## 2021-07-31 ENCOUNTER — Other Ambulatory Visit: Payer: Self-pay

## 2021-07-31 ENCOUNTER — Emergency Department (HOSPITAL_BASED_OUTPATIENT_CLINIC_OR_DEPARTMENT_OTHER)
Admission: EM | Admit: 2021-07-31 | Discharge: 2021-07-31 | Disposition: A | Discharge status: Home / Self Care | Payer: PRIVATE HEALTH INSURANCE | Attending: Emergency Medicine | Admitting: Emergency Medicine

## 2021-07-31 ENCOUNTER — Emergency Department (HOSPITAL_BASED_OUTPATIENT_CLINIC_OR_DEPARTMENT_OTHER): Payer: PRIVATE HEALTH INSURANCE

## 2021-07-31 ENCOUNTER — Encounter (HOSPITAL_BASED_OUTPATIENT_CLINIC_OR_DEPARTMENT_OTHER): Payer: Self-pay | Admitting: Pediatrics

## 2021-07-31 DIAGNOSIS — N9489 Other specified conditions associated with female genital organs and menstrual cycle: Secondary | ICD-10-CM | POA: Diagnosis not present

## 2021-07-31 DIAGNOSIS — R1084 Generalized abdominal pain: Secondary | ICD-10-CM | POA: Insufficient documentation

## 2021-07-31 DIAGNOSIS — N838 Other noninflammatory disorders of ovary, fallopian tube and broad ligament: Secondary | ICD-10-CM | POA: Diagnosis not present

## 2021-07-31 DIAGNOSIS — R197 Diarrhea, unspecified: Secondary | ICD-10-CM | POA: Diagnosis present

## 2021-07-31 LAB — C DIFFICILE QUICK SCREEN W PCR REFLEX
C Diff antigen: NEGATIVE
C Diff interpretation: NOT DETECTED
C Diff toxin: NEGATIVE

## 2021-07-31 LAB — URINALYSIS, ROUTINE W REFLEX MICROSCOPIC
Bilirubin Urine: NEGATIVE
Glucose, UA: NEGATIVE mg/dL
Ketones, ur: NEGATIVE mg/dL
Leukocytes,Ua: NEGATIVE
Nitrite: NEGATIVE
Protein, ur: 30 mg/dL — AB
Specific Gravity, Urine: 1.027 (ref 1.005–1.030)
pH: 5.5 (ref 5.0–8.0)

## 2021-07-31 LAB — COMPREHENSIVE METABOLIC PANEL
ALT: 84 U/L — ABNORMAL HIGH (ref 0–44)
AST: 32 U/L (ref 15–41)
Albumin: 4.5 g/dL (ref 3.5–5.0)
Alkaline Phosphatase: 100 U/L (ref 38–126)
Anion gap: 10 (ref 5–15)
BUN: 14 mg/dL (ref 6–20)
CO2: 20 mmol/L — ABNORMAL LOW (ref 22–32)
Calcium: 8.9 mg/dL (ref 8.9–10.3)
Chloride: 105 mmol/L (ref 98–111)
Creatinine, Ser: 0.9 mg/dL (ref 0.44–1.00)
GFR, Estimated: >60 mL/min (ref >60)
Glucose, Bld: 85 mg/dL (ref 70–99)
Potassium: 4.3 mmol/L (ref 3.5–5.1)
Sodium: 135 mmol/L (ref 135–145)
Total Bilirubin: 0.8 mg/dL (ref 0.3–1.2)
Total Protein: 7.4 g/dL (ref 6.5–8.1)

## 2021-07-31 LAB — LIPASE, BLOOD: Lipase: 19 U/L (ref 11–51)

## 2021-07-31 LAB — HCG, SERUM, QUALITATIVE: Preg, Serum: NEGATIVE

## 2021-07-31 LAB — CBC
HCT: 40.5 % (ref 36.0–46.0)
Hemoglobin: 12.5 g/dL (ref 12.0–15.0)
MCH: 25.2 pg — ABNORMAL LOW (ref 26.0–34.0)
MCHC: 30.9 g/dL (ref 30.0–36.0)
MCV: 81.7 fL (ref 80.0–100.0)
Platelets: 336 10*3/uL (ref 150–400)
RBC: 4.96 MIL/uL (ref 3.87–5.11)
RDW: 15.9 % — ABNORMAL HIGH (ref 11.5–15.5)
WBC: 8.8 10*3/uL (ref 4.0–10.5)
nRBC: 0 % (ref 0.0–0.2)

## 2021-07-31 MED ORDER — IOHEXOL 300 MG/ML  SOLN
100.0000 mL | Freq: Once | INTRAMUSCULAR | Status: AC | PRN
  Administered 2021-07-31: 85 mL via INTRAVENOUS

## 2021-07-31 MED ORDER — ONDANSETRON HCL 4 MG/2ML IJ SOLN
4.0000 mg | Freq: Once | INTRAMUSCULAR | Status: AC
Start: 2021-07-31 — End: 2021-07-31
  Administered 2021-07-31: 4 mg via INTRAVENOUS
  Filled 2021-07-31: qty 2

## 2021-07-31 MED ORDER — LOPERAMIDE HCL 2 MG PO CAPS
2.0000 mg | ORAL_CAPSULE | Freq: Four times a day (QID) | ORAL | 0 refills | Status: DC | PRN
  Filled 2021-07-31: qty 12, 3d supply, fill #0

## 2021-07-31 MED ORDER — LACTATED RINGERS IV BOLUS
1000.0000 mL | Freq: Once | INTRAVENOUS | Status: AC
  Administered 2021-07-31: 1000 mL via INTRAVENOUS

## 2021-07-31 MED ORDER — LOPERAMIDE HCL 2 MG PO CAPS
4.0000 mg | ORAL_CAPSULE | Freq: Once | ORAL | Status: AC
  Administered 2021-07-31: 4 mg via ORAL
  Filled 2021-07-31: qty 2

## 2021-07-31 MED ORDER — AZITHROMYCIN 250 MG PO TABS
250.0000 mg | ORAL_TABLET | Freq: Every day | ORAL | 0 refills | Status: DC
  Filled 2021-07-31: qty 6, 5d supply, fill #0

## 2021-07-31 NOTE — ED Provider Notes (Signed)
Blood pressure 112/71, pulse 75, temperature 97.8 ?F (36.6 ?C), temperature source Oral, resp. rate 18, height 5\' 3"  (1.6 m), weight 92.1 kg, last menstrual period 07/22/2021, SpO2 100 %. ? ?Assuming care from Dr. 07/24/2021.  In short, PARA Erin Flores is a 36 y.o. female with a chief complaint of Abdominal Pain and Diarrhea ?30  Refer to the original H&P for additional details. ? ?The current plan of care is to f/u on C diff and reassess. ? ?05:14 PM  ?C. difficile and GI panel are pending.  Patient asked that I come and see her.  She has her father waiting out of the car and she needs to get out to be with him.  Will hold on antibiotics not having the C. difficile result.  She has the MyChart app and will coordinate with her PCP to start antibiotic therapy as indicated as an outpatient.  Abortive care medications called into the pharmacy. ? ?  ?Marland Kitchen, MD ?07/31/21 1715 ? ?

## 2021-07-31 NOTE — ED Triage Notes (Signed)
C/O diarrhea x 8 days; along w/ abdominal pain and nausea that has worsen the last 2 days; reported has taken OTC meds for the diarrhea and has not resolved.   ?

## 2021-07-31 NOTE — ED Provider Notes (Signed)
[facility] DEPT Provider Note   CSN: [id number] Arrival date & time: 07/31/21  1130     History  Chief Complaint  Patient presents with   Abdominal Pain   Diarrhea    Erin Flores is a 36 y.o. female.  HPI 36 year old female with diarrhea that began last week.  Symptoms have been frequent loose runny stools with diffuse abdominal pain.  She has associated nausea but no vomiting.  She has been taking p.o. and making urine.  Not had similar symptoms in the past.  She has not traveled or had any known sick contacts.    Home Medications Prior to Admission medications   Medication Sig Start Date End Date Taking? Authorizing Provider  ALPRAZolam Duanne Moron) 1 MG tablet Take 1 mg by mouth 2 (two) times daily as needed. 01/27/21   [provider]  amphetamine-dextroamphetamine (ADDERALL) 20 MG tablet Take 20 mg by mouth 2 (two) times daily.    [provider]  escitalopram (LEXAPRO) 10 MG tablet Take 10 mg by mouth daily.    [provider]  irbesartan (AVAPRO) 150 MG tablet Take 1 tablet (150 mg total) by mouth daily. 02/22/21   McGowen, Adrian Blackwater, MD  norethindrone (MICRONOR) 0.35 MG tablet Take 1 tablet by mouth daily.    [provider]  tirzepatide Darcel Bayley) 5 MG/0.5ML Pen Inject 5 mg into the skin once a week. 07/15/21   Whitmire, Joneen Boers, FNP  traZODone (DESYREL) 100 MG tablet Take 100 mg by mouth at bedtime.    [provider]  Upadacitinib ER 30 MG TB24 Take by mouth.    [provider]      Allergies    Dilaudid [hydromorphone hcl] and Simponi [golimumab]    Review of Systems   Review of Systems  All other systems reviewed and are negative.  Physical Exam Updated Vital Signs BP 112/71    Pulse 75    Temp 97.8 F (36.6 C) (Oral)    Resp 18    Ht 1.6 m ($Remo'5\' 3"'QtruQ$ )    Wt 92.1 kg    LMP 07/22/2021 (Exact Date)    SpO2 100%    BMI 35.96 kg/m  Physical Exam Vitals and nursing note reviewed.   Constitutional:      General: She is not in acute distress.    Appearance: She is well-developed.  HENT:     Head: Normocephalic and atraumatic.     Right Ear: External ear normal.     Left Ear: External ear normal.     Nose: Nose normal.  Eyes:     Conjunctiva/sclera: Conjunctivae normal.     Pupils: Pupils are equal, round, and reactive to light.  Pulmonary:     Effort: Pulmonary effort is normal.  Abdominal:     General: Abdomen is flat. Bowel sounds are normal.     Palpations: Abdomen is soft.     Tenderness: There is generalized abdominal tenderness.  Musculoskeletal:        General: Normal range of motion.     Cervical back: Normal range of motion and neck supple.  Skin:    General: Skin is warm and dry.  Neurological:     Mental Status: She is alert and oriented to person, place, and time.     Motor: No abnormal muscle tone.     Coordination: Coordination normal.  Psychiatric:        Behavior: Behavior normal.        Thought Content: Thought  content normal.    ED Results / Procedures / Treatments   Labs (all labs ordered are listed, but only abnormal results are displayed) Labs Reviewed  CBC - Abnormal; Notable for the following components:      Result Value   MCH 25.2 (*)    RDW 15.9 (*)    All other components within normal limits  COMPREHENSIVE METABOLIC PANEL - Abnormal; Notable for the following components:   CO2 20 (*)    ALT 84 (*)    All other components within normal limits  URINALYSIS, ROUTINE W REFLEX MICROSCOPIC - Abnormal; Notable for the following components:   APPearance HAZY (*)    Hgb urine dipstick MODERATE (*)    Protein, ur 30 (*)    Bacteria, UA RARE (*)    All other components within normal limits  GASTROINTESTINAL PANEL BY PCR, STOOL (REPLACES STOOL CULTURE)  C DIFFICILE QUICK SCREEN W PCR REFLEX    LIPASE, BLOOD  HCG, SERUM, QUALITATIVE    EKG None  Radiology CT ABDOMEN PELVIS W CONTRAST  Result Date: 07/31/2021 CLINICAL  DATA:  Diffuse abdominal pain for 8 days with diarrhea. EXAM: CT ABDOMEN AND PELVIS WITH CONTRAST TECHNIQUE: Multidetector CT imaging of the abdomen and pelvis was performed using the standard protocol following bolus administration of intravenous contrast. RADIATION DOSE REDUCTION: This exam was performed according to the departmental dose-optimization program which includes automated exposure control, adjustment of the mA and/or kV according to patient size and/or use of iterative reconstruction technique. CONTRAST:  56mL OMNIPAQUE IOHEXOL 300 MG/ML  SOLN COMPARISON:  June 26, 2021 FINDINGS: Lower chest: No acute abnormality. Hepatobiliary: No focal liver abnormality is seen. Status post cholecystectomy. No biliary dilatation. Pancreas: Unremarkable. No pancreatic ductal dilatation or surrounding inflammatory changes. Spleen: Normal in size without focal abnormality. Adrenals/Urinary Tract: Adrenal glands are unremarkable. Kidneys are normal, without renal calculi, focal lesion, or hydronephrosis. Bladder is unremarkable. Stomach/Bowel: Stomach is within normal limits. Appendix appears normal. No evidence of bowel wall thickening, distention, or inflammatory changes. Vascular/Lymphatic: No significant vascular findings are present. No enlarged abdominal or pelvic lymph nodes. Reproductive: Normal appearance of the uterus and left adnexa. Hypoattenuated circumscribed mass in the region of the right adnexa measures 3.5 by 3.2 cm. Other: No abdominal wall hernia or abnormality. No abdominopelvic ascites. Musculoskeletal: No acute or significant osseous findings. IMPRESSION: 1. No acute abnormalities within the abdomen or pelvis. 2. Hypoattenuated circumscribed mass in the region of the right adnexa measures 3.5 cm. This finding has a density higher than simple fluid, but given it was not present on the abdominal CT dated June 26, 2021, it may represent a complicated/hemorrhagic/proteinaceous cyst. Follow-up  with pelvic ultrasound may be considered in 6-8 weeks. Electronically Signed   By: Fidela Salisbury M.D.   On: 07/31/2021 14:13    Procedures Procedures    Medications Ordered in ED Medications  loperamide (IMODIUM) capsule 4 mg (has no administration in time range)  lactated ringers bolus 1,000 mL (0 mLs Intravenous Stopped 07/31/21 1346)  ondansetron (ZOFRAN) injection 4 mg (4 mg Intravenous Given 07/31/21 1226)  iohexol (OMNIPAQUE) 300 MG/ML solution 100 mL (85 mLs Intravenous Contrast Given 07/31/21 1357)    ED Course/ Medical Decision Making/ A&P Clinical Course as of 07/31/21 1540  Wed Jul 31, 2021  1535 Urinalysis reviewed and interpreted and suspect that this is a contaminated specimen [DR]  1536 Comprehensive metabolic panel(!) C-Met reviewed and interpreted and mildly decreased CO2 and slightly elevated ALT otherwise all within normal  limits [DR]  1536 CT ABDOMEN PELVIS W CONTRAST ET abdomen reviewed and significant only for mass in the region of the right adnexa that measures 3.5 cm. Discussed with patient and will need follow-up with her gynecologist in 6 to 8 weeks [DR]    Clinical Course User Index [DR] Pattricia Boss, MD                           Medical Decision Making 36 year old female who presents today with diarrhea for 1 week.  It is frequent and copious and runny.  No reports of bleeding or pus noted.  CT was obtained here and significant only for ovarian mass likely cyst and patient advised regarding need for follow-up. Patient is given Imodium here She is given a liter normal saline She is taking fluids by mouth without difficulty C. difficile is pending and diarrhea panel pending  If C. difficile is negative, will discharge on azithromycin  and Imodium. Care discussed with Dr. Laverta Baltimore who will follow-up on C. difficile and make appropriate disposition  Amount and/or Complexity of Data Reviewed Labs: ordered. Decision-making details documented in ED  Course. Radiology: ordered and independent interpretation performed. Decision-making details documented in ED Course. ECG/medicine tests: ordered.  Risk Prescription drug management. Parenteral controlled substances. Decision regarding hospitalization.           Final Clinical Impression(s) / ED Diagnoses Final diagnoses:  Diarrhea of presumed infectious origin  Adnexal mass    Rx / DC Orders ED Discharge Orders     None         Pattricia Boss, MD 07/31/21 1540

## 2021-07-31 NOTE — Discharge Instructions (Addendum)
Abnormality seen on your CAT scan today in the area of your review.  This likely represents a cyst. ?Please discuss with your gynecologist and have follow-up ultrasound ?Please use Imodium 2 mg up to 4 times a day ?You are being prescribed Zithromax. You can begin taking this after your C diff results come back negative. Please coordinate with your PCP to review these results since you will not be staying in the ED.  ?Please continue oral hydration as we Discussed with Gatorade mixed half-and-half with water ?

## 2021-08-01 ENCOUNTER — Telehealth (HOSPITAL_BASED_OUTPATIENT_CLINIC_OR_DEPARTMENT_OTHER): Payer: Self-pay | Admitting: Emergency Medicine

## 2021-08-01 LAB — GASTROINTESTINAL PANEL BY PCR, STOOL (REPLACES STOOL CULTURE)

## 2021-08-05 ENCOUNTER — Other Ambulatory Visit: Payer: Self-pay

## 2021-08-05 ENCOUNTER — Encounter (INDEPENDENT_AMBULATORY_CARE_PROVIDER_SITE_OTHER): Payer: Self-pay | Admitting: Adult Health

## 2021-08-05 ENCOUNTER — Ambulatory Visit (INDEPENDENT_AMBULATORY_CARE_PROVIDER_SITE_OTHER): Payer: No Typology Code available for payment source | Admitting: Adult Health

## 2021-08-05 VITALS — BP 112/77 | HR 96 | Temp 98.3°F | Ht 63.0 in | Wt 200.0 lb

## 2021-08-05 DIAGNOSIS — E8881 Metabolic syndrome: Secondary | ICD-10-CM | POA: Diagnosis not present

## 2021-08-05 DIAGNOSIS — E669 Obesity, unspecified: Secondary | ICD-10-CM | POA: Diagnosis not present

## 2021-08-05 DIAGNOSIS — Z9189 Other specified personal risk factors, not elsewhere classified: Secondary | ICD-10-CM

## 2021-08-05 DIAGNOSIS — E88819 Insulin resistance, unspecified: Secondary | ICD-10-CM

## 2021-08-05 DIAGNOSIS — R7401 Elevation of levels of liver transaminase levels: Secondary | ICD-10-CM | POA: Diagnosis not present

## 2021-08-05 DIAGNOSIS — Z6835 Body mass index (BMI) 35.0-35.9, adult: Secondary | ICD-10-CM | POA: Diagnosis not present

## 2021-08-05 DIAGNOSIS — E66812 Obesity, class 2: Secondary | ICD-10-CM

## 2021-08-05 MED ORDER — TIRZEPATIDE 5 MG/0.5ML ~~LOC~~ SOAJ: 5.0000 mg | SUBCUTANEOUS | 0 refills | Status: DC

## 2021-08-06 DIAGNOSIS — R7401 Elevation of levels of liver transaminase levels: Secondary | ICD-10-CM | POA: Insufficient documentation

## 2021-08-06 NOTE — Progress Notes (Signed)
? ? ? ?Chief Complaint:  ? ?OBESITY ?Erin Flores is here to discuss her progress with her obesity treatment plan along with follow-up of her obesity related diagnoses. Erin Flores is on the Category 2 Plan and states she is following her eating plan approximately 50% of the time. Erin Flores states she is doing cardio for 60 minutes 1 time per week. ? ?Today's visit was #: 9 ?Starting weight: 201 lbs ?Starting date: 01/23/2021 ?Today's weight: 200 lbs ?Today's date: 08/05/2021 ?Total lbs lost to date: 1 lb ?Total lbs lost since last in-office visit: 8 lbs ? ?Interim History:  ?On 07/31/2021, ED visit - abdominal pain/diarrhea.   ?Positive for norovirus GI/GII. ? ?Metformin (GI upset- alternating constipation/diarrhea) replaced with Mounjaro on 03/18/2021 - currently on 5 mg once weekly. ? ?Not currently sexually active in the last few weeks - on oral birth control- Norethindrone 0.35 mg QD. ? ? ?Subjective:  ? ?1. Insulin resistance ?Metformin (GI upset- alternating constipation/diarrhea) replaced with Mounjaro on 03/18/2021 - currently on 5 mg once weekly. ? ?2. Transaminitis ?Discussed labs with patient today.  ?On 07/31/2021, CMP - worsening ALT. ?She denies RUQ pain. ? ?3. At risk for deficient intake of food ?Erin Flores is at risk for deficient intake off food due to GI upset. ? ?Assessment/Plan:  ? ?1. Insulin resistance ?Refill Mounjaro 5 mg once weekly. ? ?- Refill tirzepatide (MOUNJARO) 5 MG/0.5ML Pen; Inject 5 mg into the skin once a week.  Dispense: 2 mL; Refill: 0 ? ?2. Transaminitis ?Avoid hepatoxic substances.   ?Monitor labs. ? ?3. At risk for deficient intake of food ?Erin Flores was given approximately 15 minutes of deficient intake of food prevention counseling today. Erin Flores is at risk for eating too few calories based on current food recall. She was encouraged to focus on meeting caloric and protein goals according to her recommended meal plan.  ? ?4. Obesity with current BMI of 35.5 ? ?Erin Flores is currently in the action  stage of change. As such, her goal is to continue with weight loss efforts. She has agreed to the Category 2 Plan and keeping a food journal and adhering to recommended goals of 300-400 calories and 30 grams of protein.  ? ?Exercise goals:  As is. ? ?Behavioral modification strategies: increasing lean protein intake, decreasing simple carbohydrates, meal planning and cooking strategies, keeping healthy foods in the home, and planning for success. ? ?Erin Flores has agreed to follow-up with our clinic in 2-3 weeks. She was informed of the importance of frequent follow-up visits to maximize her success with intensive lifestyle modifications for her multiple health conditions.  ? ?Objective:  ? ?Blood pressure 112/77, pulse 96, temperature 98.3 ?F (36.8 ?C), height 5\' 3"  (1.6 m), weight 200 lb (90.7 kg), last menstrual period 07/22/2021, SpO2 97 %. ?Body mass index is 35.43 kg/m?. ? ?General: Cooperative, alert, well developed, in no acute distress. ?HEENT: Conjunctivae and lids unremarkable. ?Cardiovascular: Regular rhythm.  ?Lungs: Normal work of breathing. ?Neurologic: No focal deficits.  ? ?Lab Results  ?Component Value Date  ? CREATININE 0.90 07/31/2021  ? BUN 14 07/31/2021  ? NA 135 07/31/2021  ? K 4.3 07/31/2021  ? CL 105 07/31/2021  ? CO2 20 (L) 07/31/2021  ? ?Lab Results  ?Component Value Date  ? ALT 84 (H) 07/31/2021  ? AST 32 07/31/2021  ? ALKPHOS 100 07/31/2021  ? BILITOT 0.8 07/31/2021  ? ?Lab Results  ?Component Value Date  ? HGBA1C 5.3 05/14/2021  ? HGBA1C 5.5 01/23/2021  ? ?Lab Results  ?Component  Value Date  ? INSULIN 6.8 05/14/2021  ? INSULIN 12.5 01/23/2021  ? ?Lab Results  ?Component Value Date  ? TSH 3.10 01/28/2021  ? ?Lab Results  ?Component Value Date  ? CHOL 264 (H) 05/14/2021  ? HDL 48 05/14/2021  ? LDLCALC 162 (H) 05/14/2021  ? TRIG 288 (H) 05/14/2021  ? CHOLHDL 5.5 (H) 05/14/2021  ? ?Lab Results  ?Component Value Date  ? VD25OH 51.0 01/23/2021  ? ?Lab Results  ?Component Value Date  ? WBC 8.8  07/31/2021  ? HGB 12.5 07/31/2021  ? HCT 40.5 07/31/2021  ? MCV 81.7 07/31/2021  ? PLT 336 07/31/2021  ? ?Lab Results  ?Component Value Date  ? IRON 94 05/14/2021  ? TIBC 539 (H) 05/14/2021  ? FERRITIN 21 05/14/2021  ? ?Attestation Statements:  ? ?Reviewed by clinician on day of visit: allergies, medications, problem list, medical history, surgical history, family history, social history, and previous encounter notes. ? ?I, Insurance claims handler, CMA, am acting as Energy manager for William Hamburger, NP. ? ?I have reviewed the above documentation for accuracy and completeness, and I agree with the above. -  Jeptha Hinnenkamp d. Shondale Quinley, NP-C ?

## 2021-08-20 IMAGING — CT CT ABD-PELV W/ CM
2 of 4 series · 17 of 46 positions shown, 19 images · IV contrast (Omnipaque)
Comparison: Noncontrast CT on 04/14/2015

CLINICAL DATA: Acute onset right lower quadrant pain with nausea
and vomiting beginning last night. Suspected appendicitis.

EXAM:
CT ABDOMEN AND PELVIS WITH CONTRAST
TECHNIQUE: Multidetector CT imaging of the abdomen and pelvis was performed
using the standard protocol following bolus administration of
intravenous contrast.
CONTRAST:  100mL OMNIPAQUE IOHEXOL 300 MG/ML  SOLN

[Series 2: axial st · axial · 0.98mm/px · z∈[-527,-57]mm · 14 of 102 slices shown, 16 images]
[im 4/102  soft-tissue]
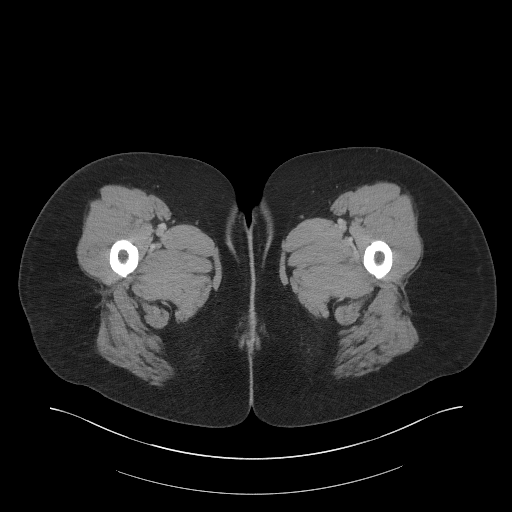
[im 4/102  bone]
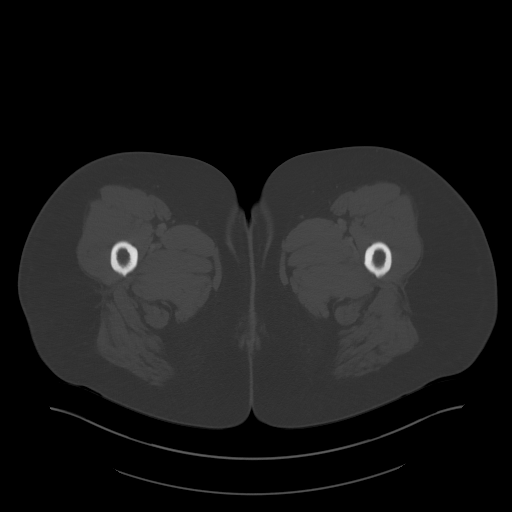
[im 12/102  soft-tissue]
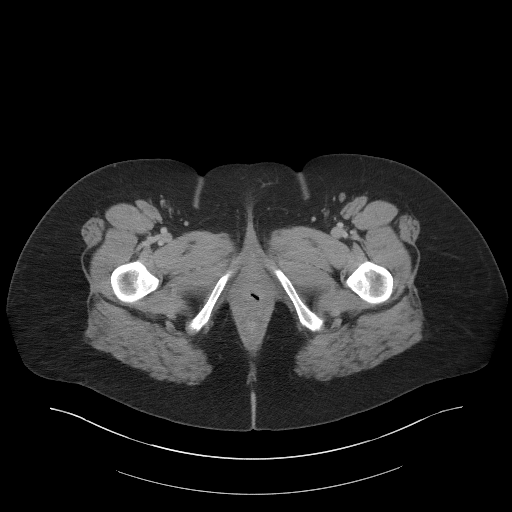
[im 20/102  soft-tissue]
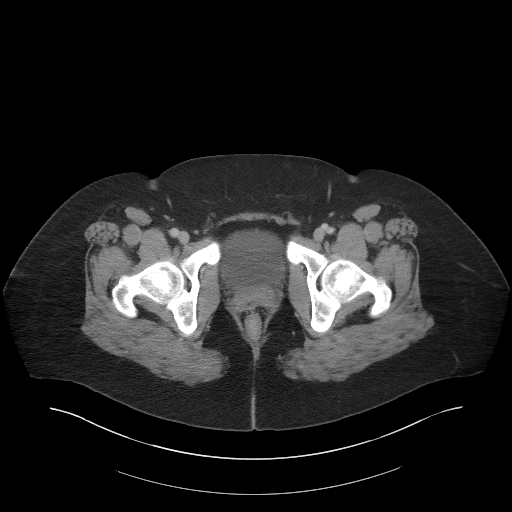
[im 28/102  soft-tissue]
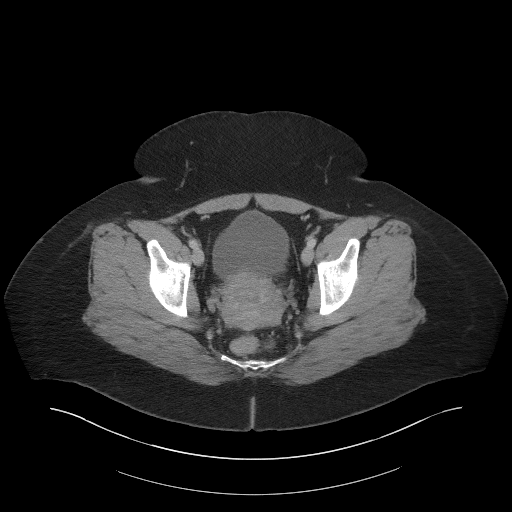
[im 35/102  soft-tissue]
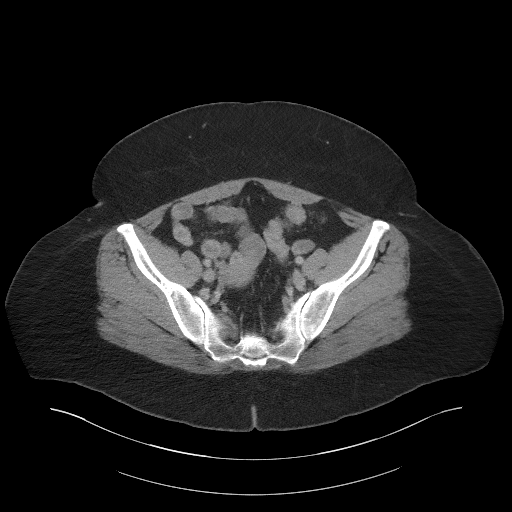
[im 39/102  soft-tissue]
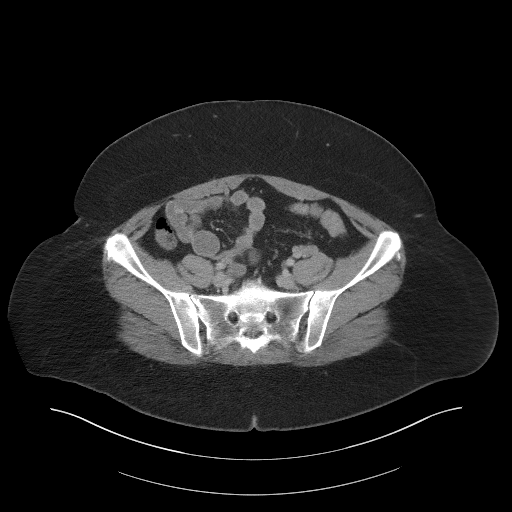
[im 47/102  soft-tissue]
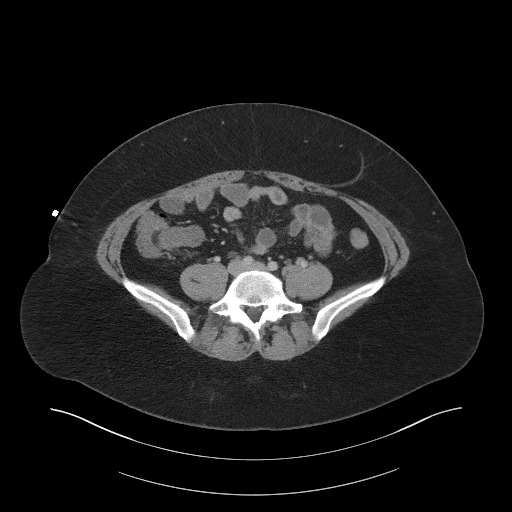
[im 55/102  soft-tissue]
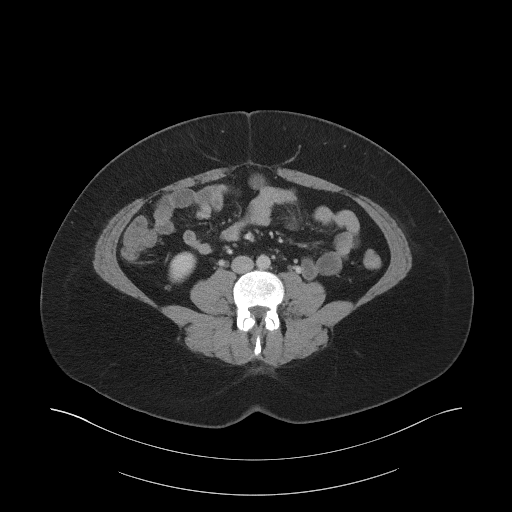
[im 63/102  soft-tissue]
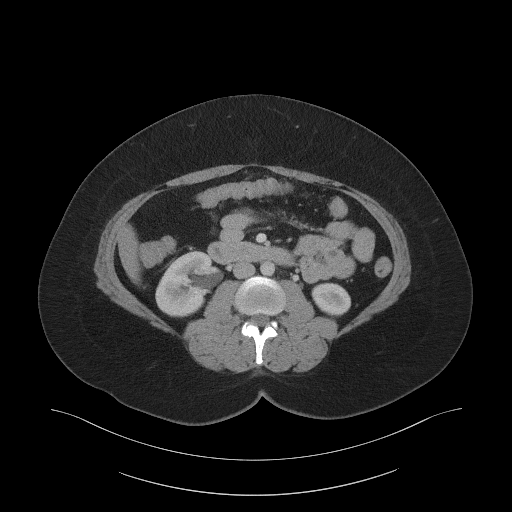
[im 63/102  bone]
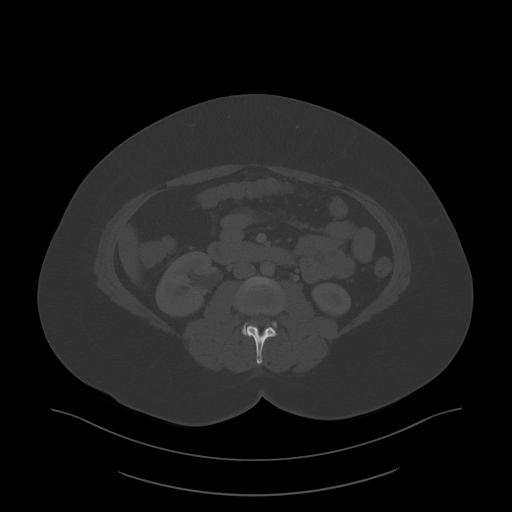
[im 67/102  soft-tissue]
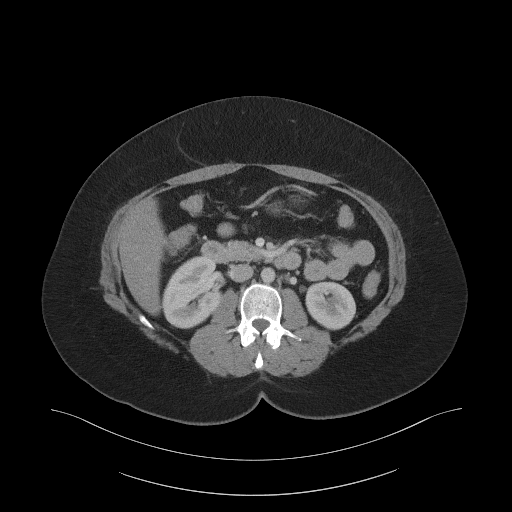
[im 74/102  soft-tissue]
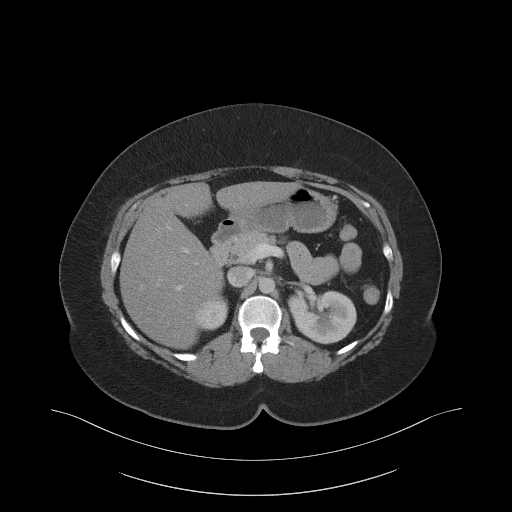
[im 82/102  soft-tissue]
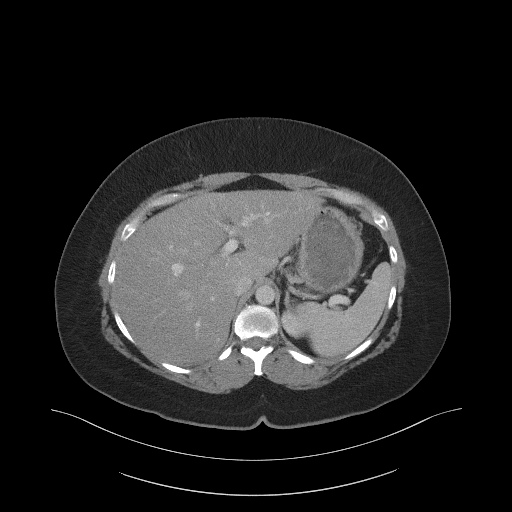
[im 90/102  soft-tissue]
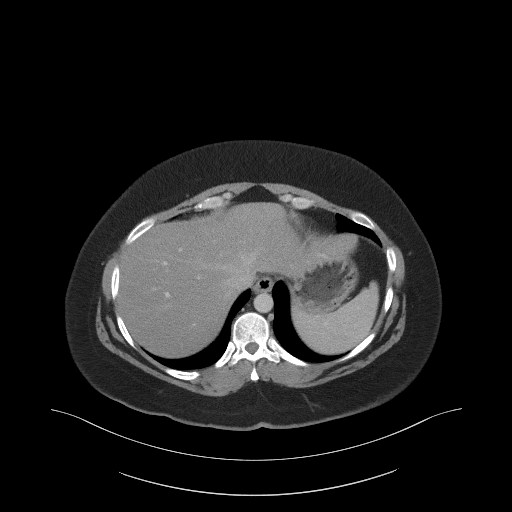
[im 98/102  soft-tissue]
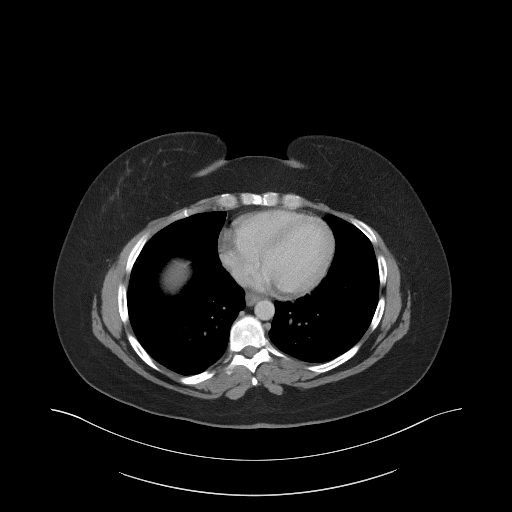

[Series 5: coronal st · coronal · 0.93mm/px · 3 of 110 slices shown]
[im 37/110  soft-tissue]
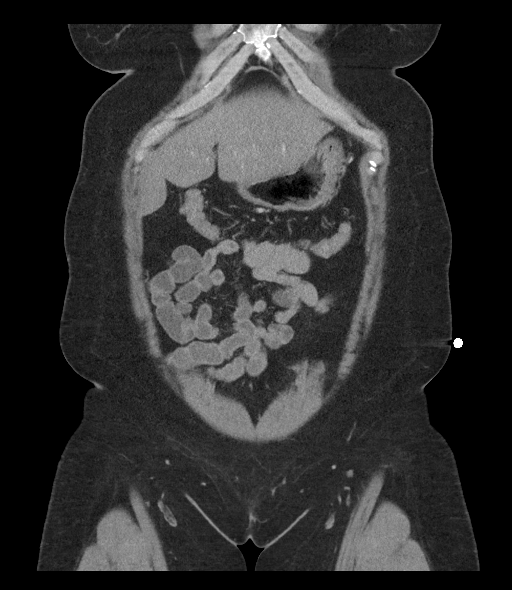
[im 49/110  soft-tissue]
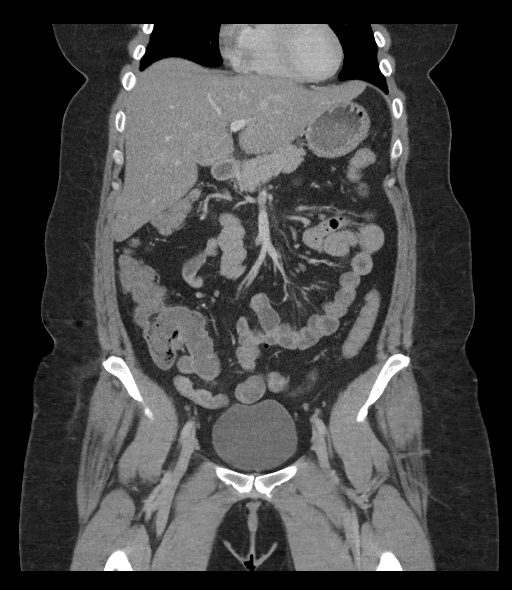
[im 61/110  soft-tissue]
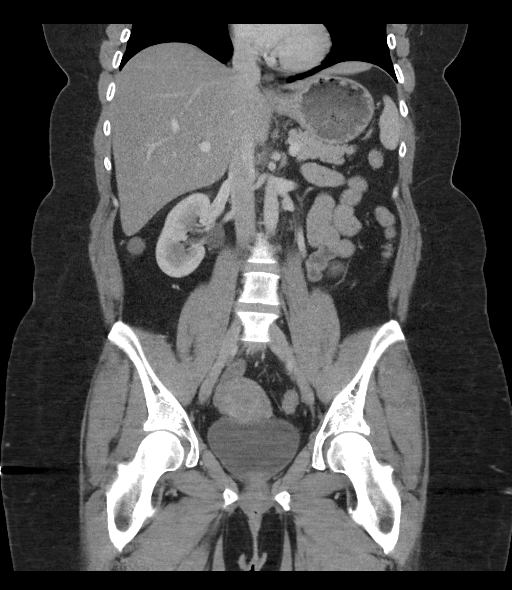

[17 of 46 positions shown; findings below may reference images not displayed]

FINDINGS: Lower Chest: No acute findings.

Hepatobiliary: No hepatic masses identified. Mild diffuse hepatic
steatosis noted. Prior cholecystectomy. No evidence of biliary
obstruction.

Pancreas:  No mass or inflammatory changes.

Spleen: Within normal limits in size and appearance.

Adrenals/Urinary Tract: No masses identified. No evidence of
ureteral calculi or hydronephrosis.

Stomach/Bowel: No evidence of obstruction, inflammatory process or
abnormal fluid collections. Normal appendix visualized.

Vascular/Lymphatic: No pathologically enlarged lymph nodes. No
abdominal aortic aneurysm.

Reproductive:  No mass or other significant abnormality.

Other:  None.

Musculoskeletal:  No suspicious bone lesions identified.
IMPRESSION: No evidence of appendicitis or other acute findings.

Mild hepatic steatosis.

## 2021-08-21 ENCOUNTER — Encounter (INDEPENDENT_AMBULATORY_CARE_PROVIDER_SITE_OTHER): Payer: Self-pay | Admitting: Adult Health

## 2021-08-21 ENCOUNTER — Ambulatory Visit (INDEPENDENT_AMBULATORY_CARE_PROVIDER_SITE_OTHER): Payer: No Typology Code available for payment source | Admitting: Adult Health

## 2021-08-21 ENCOUNTER — Other Ambulatory Visit: Payer: Self-pay

## 2021-08-21 VITALS — BP 119/76 | HR 95 | Temp 98.1°F | Ht 63.0 in | Wt 195.0 lb

## 2021-08-21 DIAGNOSIS — Z6834 Body mass index (BMI) 34.0-34.9, adult: Secondary | ICD-10-CM

## 2021-08-21 DIAGNOSIS — E669 Obesity, unspecified: Secondary | ICD-10-CM | POA: Diagnosis not present

## 2021-08-21 DIAGNOSIS — E8881 Metabolic syndrome: Secondary | ICD-10-CM

## 2021-08-21 DIAGNOSIS — I10 Essential (primary) hypertension: Secondary | ICD-10-CM

## 2021-08-21 DIAGNOSIS — E66812 Obesity, class 2: Secondary | ICD-10-CM

## 2021-08-21 DIAGNOSIS — E88819 Insulin resistance, unspecified: Secondary | ICD-10-CM

## 2021-08-21 DIAGNOSIS — Z9189 Other specified personal risk factors, not elsewhere classified: Secondary | ICD-10-CM

## 2021-08-21 MED ORDER — TIRZEPATIDE 7.5 MG/0.5ML ~~LOC~~ SOAJ: 7.5000 mg | SUBCUTANEOUS | 0 refills | Status: DC

## 2021-08-21 NOTE — Progress Notes (Signed)
? ? ? ?Chief Complaint:  ? ?OBESITY ?Erin Flores is here to discuss her progress with her obesity treatment plan along with follow-up of her obesity related diagnoses. Erin Flores is on the Category 2 Plan and keeping a food journal and adhering to recommended goals of 300-400 calories and 30 grams of protein and states she is following her eating plan approximately 95-100% of the time. 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 ? ?Today's visit was #: 10 ?Starting weight: 201 lbs ?Starting date: 01/23/2021 ?Today's weight: 195 lbs ?Today's date: 08/21/2021 ?Total lbs lost to date: 6 lbs ?Total lbs lost since last in-office visit: 5 lbs ? ?Interim History:  ?Erin Flores reports increased polyphagia the last few weeks. ? ?In 2019 - phentermine 37.5 mg for 1 year - lost 60 lbs - rebound weight of more than 60 lbs. ? ?Subjective:  ? ?1. Insulin resistance ?Metformin (GI upset- alternating constipation/diarrhea) replaced with Mounjaro on 03/18/2021 - currently on 5 mg once weekly. ?Erin Flores reports increased polyphagia the last few weeks. ?She denies mass in neck, dysphagia, dyspepsia, persistent hoarseness, abd pain, or N/V/Constipation. ? ?2. Essential hypertension ?She has been experiencing sx's of hypotension when going to urinate at night and now during daytime activities. ?She has experienced syncopal events, she states "I have woken up slumped over in the bathroom" when going to bathroom at nighttime. ?She denies CP/dyspnea/palpitations. ?She has lost 6 lbs and has been exercising regularly the last several months- which all can contribute to naturally lowering BP. ?On irbesartan 150 mg daily - managed by PCP. ? ?3. At risk for complication associated with hypotension ?The patient is at a higher than average risk of hypotension due to steady weight loss.  ? ?Assessment/Plan:  ? ?1. Insulin resistance ?Refill and increase Mounjaro to 7.5 mg once weekly. ? ?- Increase tirzepatide  (MOUNJARO) 7.5 MG/0.5ML Pen; Inject 7.5 mg into the skin once a week.  Dispense: 6 mL; Refill: 0 ? ?2. Essential hypertension ?Decrease irbesartan from 150 mg whole tablet to 1/2 tablet - monitor BP, record readings and bring log to next OV. ?Increase water intake. ?Staff message sent to her PCP with reduction in ARB dosage. ? ?3. At risk for complication associated with hypotension ?Sharae was given approximately 15 minutes of education and counseling today to help avoid hypotension. We discussed risks of hypotension with weight loss and signs of hypotension such as feeling lightheaded or unsteady. ? ?Repetitive spaced learning was employed today to elicit superior memory formation and behavioral change.  ? ?4. Obesity with current BMI of 34.6 ? ?Erin Flores is currently in the action stage of change. As such, her goal is to continue with weight loss efforts. She has agreed to the Category 2 Plan and keeping a food journal and adhering to recommended goals of 300-400 calories and 30 grams of protein.  ? ?Exercise goals:  As is. ? ?Behavioral modification strategies: increasing lean protein intake, decreasing simple carbohydrates, meal planning and cooking strategies, keeping healthy foods in the home, and planning for success. ? ?Erin Flores has agreed to follow-up with our clinic in 3 weeks. She was informed of the importance of frequent follow-up visits to maximize her success with intensive lifestyle modifications for her multiple health conditions.  ? ?Objective:  ? ?Blood pressure 119/76, pulse 95, temperature 98.1 ?F (36.7 ?C), height 5\' 3"  (1.6 m), weight 195 lb (88.5 kg), last menstrual period 07/22/2021, SpO2 97 %. ?Body mass index is 34.54 kg/m?. ? ?General: Cooperative,  alert, well developed, in no acute distress. ?HEENT: Conjunctivae and lids unremarkable. ?Cardiovascular: Regular rhythm.  ?Lungs: Normal work of breathing. ?Neurologic: No focal deficits.  ? ?Lab Results  ?Component Value Date  ? CREATININE  0.90 07/31/2021  ? BUN 14 07/31/2021  ? NA 135 07/31/2021  ? K 4.3 07/31/2021  ? CL 105 07/31/2021  ? CO2 20 (L) 07/31/2021  ? ?Lab Results  ?Component Value Date  ? ALT 84 (H) 07/31/2021  ? AST 32 07/31/2021  ? ALKPHOS 100 07/31/2021  ? BILITOT 0.8 07/31/2021  ? ?Lab Results  ?Component Value Date  ? HGBA1C 5.3 05/14/2021  ? HGBA1C 5.5 01/23/2021  ? ?Lab Results  ?Component Value Date  ? INSULIN 6.8 05/14/2021  ? INSULIN 12.5 01/23/2021  ? ?Lab Results  ?Component Value Date  ? TSH 3.10 01/28/2021  ? ?Lab Results  ?Component Value Date  ? CHOL 264 (H) 05/14/2021  ? HDL 48 05/14/2021  ? LDLCALC 162 (H) 05/14/2021  ? TRIG 288 (H) 05/14/2021  ? CHOLHDL 5.5 (H) 05/14/2021  ? ?Lab Results  ?Component Value Date  ? VD25OH 51.0 01/23/2021  ? ?Lab Results  ?Component Value Date  ? WBC 8.8 07/31/2021  ? HGB 12.5 07/31/2021  ? HCT 40.5 07/31/2021  ? MCV 81.7 07/31/2021  ? PLT 336 07/31/2021  ? ?Lab Results  ?Component Value Date  ? IRON 94 05/14/2021  ? TIBC 539 (H) 05/14/2021  ? FERRITIN 21 05/14/2021  ? ?Attestation Statements:  ? ?Reviewed by clinician on day of visit: allergies, medications, problem list, medical history, surgical history, family history, social history, and previous encounter notes. ? ?I, Insurance claims handler, CMA, am acting as Energy manager for William Hamburger, NP. ? ?I have reviewed the above documentation for accuracy and completeness, and I agree with the above. -  Ruthy Forry d. Quinnlyn Hearns, NP-C ?

## 2021-09-05 ENCOUNTER — Ambulatory Visit (INDEPENDENT_AMBULATORY_CARE_PROVIDER_SITE_OTHER): Payer: No Typology Code available for payment source | Admitting: Family Medicine

## 2021-09-05 ENCOUNTER — Encounter (INDEPENDENT_AMBULATORY_CARE_PROVIDER_SITE_OTHER): Payer: Self-pay | Admitting: Family Medicine

## 2021-09-05 VITALS — BP 116/77 | HR 105 | Temp 97.5°F | Ht 63.0 in | Wt 192.0 lb

## 2021-09-05 DIAGNOSIS — E669 Obesity, unspecified: Secondary | ICD-10-CM

## 2021-09-05 DIAGNOSIS — K219 Gastro-esophageal reflux disease without esophagitis: Secondary | ICD-10-CM | POA: Diagnosis not present

## 2021-09-05 DIAGNOSIS — I10 Essential (primary) hypertension: Secondary | ICD-10-CM | POA: Diagnosis not present

## 2021-09-05 DIAGNOSIS — E8881 Metabolic syndrome: Secondary | ICD-10-CM

## 2021-09-05 DIAGNOSIS — K648 Other hemorrhoids: Secondary | ICD-10-CM

## 2021-09-05 DIAGNOSIS — Z9189 Other specified personal risk factors, not elsewhere classified: Secondary | ICD-10-CM

## 2021-09-05 DIAGNOSIS — Z6834 Body mass index (BMI) 34.0-34.9, adult: Secondary | ICD-10-CM

## 2021-09-05 MED ORDER — TIRZEPATIDE 7.5 MG/0.5ML ~~LOC~~ SOAJ: 7.5000 mg | SUBCUTANEOUS | 0 refills | Status: DC

## 2021-09-05 MED ORDER — OMEPRAZOLE 20 MG PO CPDR: 20.0000 mg | DELAYED_RELEASE_CAPSULE | Freq: Every day | ORAL | 0 refills | Status: DC

## 2021-09-09 NOTE — Progress Notes (Signed)
? ? ? ?Chief Complaint:  ? ?OBESITY ?Erin Flores is here to discuss her progress with her obesity treatment plan along with follow-up of her obesity related diagnoses. Erin Flores is on the Category 2 Plan and keeping a food journal and adhering to recommended goals of 300-400 calories and 30 grams of protein at lunch and states she is following her eating plan approximately 100% of the time. Erin Flores states she is at the gym for 45-60 minutes 4 times per week. ? ?Today's visit was #: 11 ?Starting weight: 201 lbs ?Starting date: 01/23/2021 ?Today's weight: 192 lbs ?Today's date: 09/05/2021 ?Total lbs lost to date: 9 ?Total lbs lost since last in-office visit: 3 ? ?Interim History: Erin Flores started her increased dose of Mounjaro Monday, and on Tuesdays she started with GERD and increased gas. Erin Flores increased her Mounjaro dose from 5 mg to 7.5 mg. She is deviating from the meal plan, and she eats only meat and cheese, no bread etc. ? ?Subjective:  ? ?1. Insulin resistance ?Erin Flores desires to stay at the same dose of Mounjaro despite her GI symptoms and GERD.  ? ?2. Essential hypertension ?Erin Flores's last office visit, Erin Flores decreased her blood pressure medication dose. Her blood pressure is stable. She hasn't checked it at home. She is asymptomatic.  ? ?3. Gastroesophageal reflux disease, unspecified whether esophagitis present ?Erin Flores notes increased symptoms secondary to her GLP-1. She is aware and she wishes to continue. She requests a refill of omeprazole 20 mg daily.  ? ?4. Other hemorrhoids ?Erin Flores has a history of increased symptoms recently. She notes burning, pain, and bulging.  ? ?5. At risk for malnutrition ?Erin Flores is at increased risk for malnutrition. ? ?Assessment/Plan:  ?No orders of the defined types were placed in this encounter. ? ? ?Medications Discontinued During This Encounter  ?Medication Reason  ? loperamide (IMODIUM) 2 MG capsule   ? norethindrone (MICRONOR) 0.35 MG tablet Discontinued by provider  ?  tirzepatide Greggory Keen(MOUNJARO) 7.5 MG/0.5ML Pen Reorder  ?  ? ?Meds ordered this encounter  ?Medications  ? tirzepatide (MOUNJARO) 7.5 MG/0.5ML Pen  ?  Sig: Inject 7.5 mg into the skin once a week.  ?  Dispense:  6 mL  ?  Refill:  0  ? omeprazole (PRILOSEC) 20 MG capsule  ?  Sig: Take 1 capsule (20 mg total) by mouth daily.  ?  Dispense:  30 capsule  ?  Refill:  0  ?  ? ?1. Insulin resistance ?We will refill Mounjaro at 7.5 mg once weekly for 1 month. Risks and benefits were discussed with the patient, and she is aware that many of her conditions are from or worsening due to GLP-1. ? ?- tirzepatide (MOUNJARO) 7.5 MG/0.5ML Pen; Inject 7.5 mg into the skin once a week.  Dispense: 6 mL; Refill: 0 ? ?2. Essential hypertension ?Erin Flores's blood pressure is at goal. Counseling was done. Home blood pressure monitoring every other day. She is to bring in her log into her next office visit. She will continue her medications at the same does, increase her water intake, and decrease salt.  ? ?3. Gastroesophageal reflux disease, unspecified whether esophagitis present ?Intensive lifestyle modifications are the first line treatment for this issue. We discussed several lifestyle modifications today. Erin Flores agreed to start omeprazole 20 mg 1 tablet PO daily, with no refills. She will continue to work on diet, exercise and weight loss efforts. Orders and follow up as documented in patient record.  ? ?Counseling ?If a person has gastroesophageal reflux disease (GERD), food  and stomach acid move back up into the esophagus and cause symptoms or problems such as damage to the esophagus. ?Anti-reflux measures include: raising the head of the bed, avoiding tight clothing or belts, avoiding eating late at night, not lying down shortly after mealtime, and achieving weight loss. ?Avoid ASA, NSAID's, caffeine, alcohol, and tobacco.  ?OTC Pepcid and/or Tums are often very helpful for as needed use.  ?However, for persisting chronic or daily symptoms,  stronger medications like Omeprazole may be needed. ?You may need to avoid foods and drinks such as: ?Coffee and tea (with or without caffeine). ?Drinks that contain alcohol. ?Energy drinks and sports drinks. ?Bubbly (carbonated) drinks or sodas. ?Chocolate and cocoa. ?Peppermint and mint flavorings. ?Garlic and onions. ?Horseradish. ?Spicy and acidic foods. These include peppers, chili powder, curry powder, vinegar, hot sauces, and BBQ sauce. ?Citrus fruit juices and citrus fruits, such as oranges, lemons, and limes. ?Tomato-based foods. These include red sauce, chili, salsa, and pizza with red sauce. ?Fried and fatty foods. These include donuts, french fries, potato chips, and high-fat dressings. ?High-fat meats. These include hot dogs, rib eye steak, sausage, ham, and bacon. ? ?- omeprazole (PRILOSEC) 20 MG capsule; Take 1 capsule (20 mg total) by mouth daily.  Dispense: 30 capsule; Refill: 0 ? ?4. Other hemorrhoids ?Erin Flores is to increase her fiber and water intake. She is start stool softeners. She will follow up with her PCP and discuss possible GI referral. ? ?5. At risk for malnutrition ?Erin Flores was given extensive malnutrition prevention education and counseling today of more than 15 minutes.  Counseled her that malnutrition refers to inappropriate nutrients or not the right balance of nutrients for optimal health.  Discussed with Erin Flores that it is absolutely possible to be malnourished but yet obese.  Risk factors, including but not limited to, inappropriate dietary choices, difficulty with obtaining food due to physical or financial limitations, and various physical and mental health conditions were reviewed with Erin Flores.  ?  ?6. Obesity with current BMI of 34.1 ?[name] is currently in the action stage of change. As such, her goal is to continue with weight loss efforts. She has agreed to the Category 2 Plan and keeping a food journal and adhering to recommended goals of 300-400  calories and 30 grams of protein at lunch.  ? ?I recommended the patient decrease her dose of Mounjaro, and she refuses. I recommended her to follow up with her PCP and GI. ? ?Exercise goals: As is. ? ?Behavioral modification strategies: increasing lean protein intake, decreasing simple carbohydrates, and meal planning and cooking strategies. ? ?Erin Flores has agreed to follow-up with our clinic in 2 to 3 weeks with Erin Hamburger, NP. She was informed of the importance of frequent follow-up visits to maximize her success with intensive lifestyle modifications for her multiple health conditions.  ? ?Objective:  ? ?Blood pressure 116/77, pulse (!) 105, temperature (!) 97.5 ?F (36.4 ?C), height 5\' 3"  (1.6 m), weight 192 lb (87.1 kg), SpO2 100 %. ?Body mass index is 34.01 kg/m?. ? ?General: Cooperative, alert, well developed, in no acute distress. ?HEENT: Conjunctivae and lids unremarkable. ?Cardiovascular: Regular rhythm.  ?Lungs: Normal work of breathing. ?Neurologic: No focal deficits.  ? ?Lab Results  ?Component Value Date  ? CREATININE 0.90 07/31/2021  ? BUN 14 07/31/2021  ? NA 135 07/31/2021  ? K 4.3 07/31/2021  ? CL 105 07/31/2021  ? CO2 20 (L) 07/31/2021  ? ?Lab Results  ?Component Value Date  ? ALT  84 (H) 07/31/2021  ? AST 32 07/31/2021  ? ALKPHOS 100 07/31/2021  ? BILITOT 0.8 07/31/2021  ? ?Lab Results  ?Component Value Date  ? HGBA1C 5.3 05/14/2021  ? HGBA1C 5.5 01/23/2021  ? ?Lab Results  ?Component Value Date  ? INSULIN 6.8 05/14/2021  ? INSULIN 12.5 01/23/2021  ? ?Lab Results  ?Component Value Date  ? TSH 3.10 01/28/2021  ? ?Lab Results  ?Component Value Date  ? CHOL 264 (H) 05/14/2021  ? HDL 48 05/14/2021  ? LDLCALC 162 (H) 05/14/2021  ? TRIG 288 (H) 05/14/2021  ? CHOLHDL 5.5 (H) 05/14/2021  ? ?Lab Results  ?Component Value Date  ? VD25OH 51.0 01/23/2021  ? ?Lab Results  ?Component Value Date  ? WBC 8.8 07/31/2021  ? HGB 12.5 07/31/2021  ? HCT 40.5 07/31/2021  ? MCV 81.7 07/31/2021  ? PLT 336 07/31/2021   ? ?Lab Results  ?Component Value Date  ? IRON 94 05/14/2021  ? TIBC 539 (H) 05/14/2021  ? FERRITIN 21 05/14/2021  ? ?Attestation Statements:  ? ?Reviewed by clinician on day of visit: allergies, medications, proble

## 2021-09-26 ENCOUNTER — Telehealth (INDEPENDENT_AMBULATORY_CARE_PROVIDER_SITE_OTHER): Payer: Self-pay | Admitting: Adult Health

## 2021-09-26 ENCOUNTER — Ambulatory Visit (INDEPENDENT_AMBULATORY_CARE_PROVIDER_SITE_OTHER): Payer: No Typology Code available for payment source | Admitting: Adult Health

## 2021-09-26 ENCOUNTER — Encounter (INDEPENDENT_AMBULATORY_CARE_PROVIDER_SITE_OTHER): Payer: Self-pay | Admitting: Adult Health

## 2021-09-26 ENCOUNTER — Encounter (INDEPENDENT_AMBULATORY_CARE_PROVIDER_SITE_OTHER): Payer: Self-pay

## 2021-09-26 VITALS — BP 107/71 | HR 111 | Temp 97.6°F | Ht 63.0 in | Wt 193.0 lb

## 2021-09-26 DIAGNOSIS — F419 Anxiety disorder, unspecified: Secondary | ICD-10-CM | POA: Diagnosis not present

## 2021-09-26 DIAGNOSIS — Z9189 Other specified personal risk factors, not elsewhere classified: Secondary | ICD-10-CM

## 2021-09-26 DIAGNOSIS — E669 Obesity, unspecified: Secondary | ICD-10-CM | POA: Diagnosis not present

## 2021-09-26 DIAGNOSIS — Z6834 Body mass index (BMI) 34.0-34.9, adult: Secondary | ICD-10-CM | POA: Diagnosis not present

## 2021-09-26 DIAGNOSIS — I10 Essential (primary) hypertension: Secondary | ICD-10-CM

## 2021-09-26 MED ORDER — SAXENDA 18 MG/3ML ~~LOC~~ SOPN: 2.4000 mg | PEN_INJECTOR | Freq: Every day | SUBCUTANEOUS | 0 refills | Status: DC

## 2021-09-26 NOTE — Telephone Encounter (Signed)
Prior authorization approved for Saxenda. Effective: 08/27/21 - 01/24/22. Patient sent approval message via mychart.  ?

## 2021-09-27 ENCOUNTER — Encounter: Payer: Self-pay | Admitting: Family Medicine

## 2021-10-03 ENCOUNTER — Other Ambulatory Visit (INDEPENDENT_AMBULATORY_CARE_PROVIDER_SITE_OTHER): Payer: Self-pay | Admitting: Family Medicine

## 2021-10-03 DIAGNOSIS — K219 Gastro-esophageal reflux disease without esophagitis: Secondary | ICD-10-CM

## 2021-10-08 NOTE — Progress Notes (Signed)
? ? ? ?Chief Complaint:  ? ?OBESITY ?Erin Flores is here to discuss her progress with her obesity treatment plan along with follow-up of her obesity related diagnoses. Erin Flores is on the Category 2 Plan and keeping a food journal and adhering to recommended goals of 300-400 calories and 30 grams of protein with lunch options and states she is following her eating plan approximately 100% of the time. Erin Flores states she is doing gym exercise for 45-60 minutes 4 times per week. ? ?Today's visit was #: 12 ?Starting weight: 201 lbs ?Starting date: 01/23/2021 ?Today's weight: 193 lbs ?Today's date: 09/26/2021 ?Total lbs lost to date: 8 lbs ?Total lbs lost since last in-office visit: 0 ? ?Interim History:  ?Erin Flores endorses vape use 15-20 "hits" per day depending on current stress level. ?She denies CP/dyspnea/palpitations. ? ?Subjective:  ? ?1. Essential hypertension ?Erin Flores endorses increased episodes of dizziness during day. Her blood pressure at that time 107/74.  ?In the last 3 weeks she estimates dizziness occurs 1-2 x week most of at nighttime. ?Irbesartan 150 mg decreased 75 mg on 08/21/2021. ? ?2. Anxiety ?Per Erin Flores her psychiatrist is weaning her off lexapro and started titiating up on Wellbutrin due to worsening anxiety symptoms on SSRI therapy.  ?She denies SI/HI ? ?3. At risk for complication associated with hypotension ? ?The patient is at a higher than average risk of hypotension due to weight loss and antihypertensive therapy.  ? ?Assessment/Plan:  ? ?1. Essential hypertension ?Erin Flores will follow up with her primary care physician, remain well hydrated with water.  ?Monitor ambulatory BP- bring log to f/u appts- PCP and [facility]. ? ?2. Anxiety ?Behavior modification techniques were discussed today to help Erin Flores deal with her anxiety.   ?Orders and follow up as documented in patient record.   ? ?3. At risk for complication associated with hypotension ?Erin Flores was given approximately 15 minutes of education and  counseling today to help avoid hypotension. We discussed risks of hypotension with weight loss and signs of hypotension such as feeling lightheaded or unsteady. ? ?Repetitive spaced learning was employed today to elicit superior memory formation and behavioral change.  ? ?4. Obesity with current BMI of 34.3 ?Erin Flores is currently in the action stage of change. As such, her goal is to continue with weight loss efforts. She has agreed to the Category 2 Plan.  ? ?Comeplet Mounjaro 7.5 mg therapy 2-3 more injections then start Saxenda 2.4 mg. 7 days later start Saxenda 2.4 mg for 1 month with no refills.  ? ?- Liraglutide -Weight Management (SAXENDA) 18 MG/3ML SOPN; Inject 2.4 mg into the skin daily.  Dispense: 6 mL; Refill: 0 ? ?Exercise goals:  As is.  ? ?Behavioral modification strategies: increasing lean protein intake, decreasing simple carbohydrates, meal planning and cooking strategies, keeping healthy foods in the home, and planning for success. ? ?Erin Flores has agreed to follow-up with our clinic in 6 weeks. She was informed of the importance of frequent follow-up visits to maximize her success with intensive lifestyle modifications for her multiple health conditions.  ? ?Objective:  ? ?Blood pressure 107/71, pulse (!) 111, temperature 97.6 ?F (36.4 ?C), height 5\' 3"  (1.6 m), weight 193 lb (87.5 kg), SpO2 98 %. ?Body mass index is 34.19 kg/m?. ? ?General: Cooperative, alert, well developed, in no acute distress. ?HEENT: Conjunctivae and lids unremarkable. ?Cardiovascular: Regular rhythm.  ?Lungs: Normal work of breathing. ?Neurologic: No focal deficits.  ? ?Lab Results  ?Component Value Date  ? CREATININE 0.90 07/31/2021  ? BUN 14  07/31/2021  ? NA 135 07/31/2021  ? K 4.3 07/31/2021  ? CL 105 07/31/2021  ? CO2 20 (L) 07/31/2021  ? ?Lab Results  ?Component Value Date  ? ALT 84 (H) 07/31/2021  ? AST 32 07/31/2021  ? ALKPHOS 100 07/31/2021  ? BILITOT 0.8 07/31/2021  ? ?Lab Results  ?Component Value Date  ? HGBA1C 5.3  05/14/2021  ? HGBA1C 5.5 01/23/2021  ? ?Lab Results  ?Component Value Date  ? INSULIN 6.8 05/14/2021  ? INSULIN 12.5 01/23/2021  ? ?Lab Results  ?Component Value Date  ? TSH 3.10 01/28/2021  ? ?Lab Results  ?Component Value Date  ? CHOL 264 (H) 05/14/2021  ? HDL 48 05/14/2021  ? LDLCALC 162 (H) 05/14/2021  ? TRIG 288 (H) 05/14/2021  ? CHOLHDL 5.5 (H) 05/14/2021  ? ?Lab Results  ?Component Value Date  ? VD25OH 51.0 01/23/2021  ? ?Lab Results  ?Component Value Date  ? WBC 8.8 07/31/2021  ? HGB 12.5 07/31/2021  ? HCT 40.5 07/31/2021  ? MCV 81.7 07/31/2021  ? PLT 336 07/31/2021  ? ?Lab Results  ?Component Value Date  ? IRON 94 05/14/2021  ? TIBC 539 (H) 05/14/2021  ? FERRITIN 21 05/14/2021  ? ?Attestation Statements:  ? ?Reviewed by clinician on day of visit: allergies, medications, problem list, medical history, surgical history, family history, social history, and previous encounter notes. ? ?I, Erin Flores, RMA, am acting as Energy manager for Erin Hamburger, NP. ? ?I have reviewed the above documentation for accuracy and completeness, and I agree with the above. -  Erin Flores d. Erin Bow, NP-C ?

## 2021-10-14 LAB — CBC AND DIFFERENTIAL
HCT: 38 (ref 36–46)
Hemoglobin: 12.4 (ref 12.0–16.0)
Neutrophils Absolute: 4.3
Platelets: 305 10*3/uL (ref 150–400)
WBC: 7.8

## 2021-10-14 LAB — COMPREHENSIVE METABOLIC PANEL
Albumin: 4.7 (ref 3.5–5.0)
Calcium: 9.6 (ref 8.7–10.7)
Globulin: 2.2
eGFR: 68

## 2021-10-14 LAB — BASIC METABOLIC PANEL
BUN: 12 (ref 4–21)
CO2: 25 — AB (ref 13–22)
Chloride: 103 (ref 99–108)
Creatinine: 1.1 (ref 0.5–1.1)
Glucose: 89
Potassium: 4.3 mEq/L (ref 3.5–5.1)
Sodium: 139 (ref 137–147)

## 2021-10-14 LAB — CBC: RBC: 4.64 (ref 3.87–5.11)

## 2021-10-14 LAB — HEPATIC FUNCTION PANEL
ALT: 96 U/L — AB (ref 7–35)
AST: 48 — AB (ref 13–35)
Alkaline Phosphatase: 69 (ref 25–125)
Bilirubin, Total: 0.6

## 2021-10-16 ENCOUNTER — Other Ambulatory Visit: Payer: Self-pay | Admitting: Internal Medicine

## 2021-10-16 DIAGNOSIS — R7989 Other specified abnormal findings of blood chemistry: Secondary | ICD-10-CM

## 2021-10-18 ENCOUNTER — Encounter: Payer: Self-pay | Admitting: Family Medicine

## 2021-10-18 ENCOUNTER — Ambulatory Visit (INDEPENDENT_AMBULATORY_CARE_PROVIDER_SITE_OTHER): Payer: No Typology Code available for payment source

## 2021-10-18 ENCOUNTER — Ambulatory Visit (INDEPENDENT_AMBULATORY_CARE_PROVIDER_SITE_OTHER): Payer: No Typology Code available for payment source | Admitting: Family Medicine

## 2021-10-18 VITALS — BP 101/67 | HR 86 | Temp 98.1°F | Ht 63.0 in | Wt 192.4 lb

## 2021-10-18 DIAGNOSIS — R55 Syncope and collapse: Secondary | ICD-10-CM

## 2021-10-18 DIAGNOSIS — I951 Orthostatic hypotension: Secondary | ICD-10-CM | POA: Diagnosis not present

## 2021-10-18 DIAGNOSIS — I10 Essential (primary) hypertension: Secondary | ICD-10-CM

## 2021-10-18 DIAGNOSIS — R42 Dizziness and giddiness: Secondary | ICD-10-CM

## 2021-10-18 DIAGNOSIS — E8881 Metabolic syndrome: Secondary | ICD-10-CM

## 2021-10-18 DIAGNOSIS — E88819 Insulin resistance, unspecified: Secondary | ICD-10-CM

## 2021-10-18 NOTE — Progress Notes (Unsigned)
Enrolled patient for a 7 day Zio XT monitor to be mailed to patients home.  

## 2021-10-18 NOTE — Progress Notes (Signed)
OFFICE VISIT  10/18/2021  CC:  Chief Complaint  Patient presents with   Low Blood Pressure   Patient is a 36 y.o. female who presents for recent probs with dizziness and low bp.  INTERIM HX: For the last couple of months Erin Flores has been having intermittent periods of significant dizziness that for the most part sound related to postural changes but sometimes seem more random.  Sometimes gets to the intensity of feeling like she is going to pass out, and in fact on 1 occasion when she got up to use the restroom in the middle the night she says she passed out while on the toilet, fell and hit her head on the tile floor. On 1 occasion while walking down the hall at work she began feeling presyncopal and noted palpitations at that time. Hard to tell how long these episodes last but sounds like seconds to minutes. She has had blood pressures in the low [id number] systolic lately.  She eventually weaned completely off her irbesartan a week and a half ago but the episodes have continued.  She does not monitor her blood pressure at home but at her follow-up with her weight loss clinic and on occasional blood pressure check at work it is in the 100-120 over 60s.  Her heart rate has typically been a little bit elevated at baseline but in the last couple of months she has noted it has been more in the normal range, around 80s. She does add that her stress level is markedly increased lately.  Of note, she has purposefully lost 38 pounds since January of this year with use of calorie restriction, good exercise habits, and medications like Mounjaro and Saxenda (she transitioned from [place] to [place] about 1 to 2 weeks ago). She has not checked her sugar over the last few months.  She has psoriatic arthritis and is followed by Dr. Amil Amen.  Most recent visit 10/14/2021, I reviewed that note today.  She seems to be responding good on Rinvoq since August 2022. CBC at that visit 4 days ago was normal, complete  metabolic panel was normal except for an ALT of 96 and AST of 48.  Dr. Amil Amen has ordered an abdominal ultrasound, limited right upper quadrant for further evaluation.   I do not prescribe any of her controlled substance rx's. She was started on prozac about 1-2 wks ago and these sx's started occurring prior.  Past Medical History:  Diagnosis Date   Anemia    Anxiety and depression    Attention deficit disorder (ADD)    Bilateral lower extremity edema 2020/2021   Bipolar affective disorder ([place])    question of:  pt saw psychiatrist and was on lamictal and tegretol aroun 2018; takes clonoprin 2mg  prn for social anxiety.  he majory sx's were in the context of signif psychsocial probs with the father of her daughter.   Chest pain    GERD (gastroesophageal reflux disease)    Herpes zoster 2017   History of iron deficiency anemia    History of pyelonephritis 2018   Hypertension    Migraines    "I take a lot of ibuprofen".  Worse on OCPs.  Gets 8 tension HA's a month and 1-2 migraines per month.   Psoriatic arthritis ([place]) 2014 dx   Enbrel 2020. Simponi 2021->?htn and edema rx?->stopped by Dr. Amil Amen 12/2019.  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  Procedure: LAPAROSCOPIC CHOLECYSTECTOMY WITH INTRAOPERATIVE CHOLANGIOGRAM;  Surgeon: Earnstine Regal, MD;  Location: [facility];  Service: General;  Laterality: N/A;    Outpatient Medications Prior to Visit  Medication Sig Dispense Refill   ALPRAZolam (XANAX) 1 MG tablet Take 1 mg by mouth 2 (two) times daily as needed.     amphetamine-dextroamphetamine (ADDERALL) 20 MG tablet Take 20 mg by mouth 2 (two) times daily.     buPROPion (WELLBUTRIN XL) 150 MG 24 hr tablet Take by mouth.     Drospirenone (SLYND) 4 MG TABS Take by mouth.     FLUoxetine (PROZAC) 10 MG capsule Take by mouth.     Liraglutide -Weight Management (SAXENDA) 18 MG/3ML SOPN  Inject 2.4 mg into the skin daily. 6 mL 0   omeprazole (PRILOSEC) 20 MG capsule Take 1 capsule (20 mg total) by mouth daily. 30 capsule 0   tirzepatide (MOUNJARO) 7.5 MG/0.5ML Pen Inject 7.5 mg into the skin once a week. 6 mL 0   traZODone (DESYREL) 100 MG tablet Take 100 mg by mouth at bedtime.     Upadacitinib ER 30 MG TB24 Take by mouth.     escitalopram (LEXAPRO) 20 MG tablet Take 20 mg by mouth daily. Pt states taking 30mg  (Patient not taking: Reported on 10/18/2021)     irbesartan (AVAPRO) 150 MG tablet Take 1 tablet (150 mg total) by mouth daily. (Patient not taking: Reported on 10/18/2021) 90 tablet 3   No facility-administered medications prior to visit.    Allergies  Allergen Reactions   Dilaudid [Hydromorphone Hcl] Other (See Comments)    Issues with urination   Simponi [Golimumab] Hypertension    ? HTN and LE edema    ROS As per HPI  PE:    10/18/2021    1:24 PM 09/26/2021    9:00 AM 09/05/2021    7:00 AM  Vitals with BMI  Height 5\' 3"  5\' 3"  5\' 3"   Weight 192 lbs 6 oz 193 lbs 192 lbs  BMI 34.09 [id number]  Systolic [id number]  Diastolic 67 71 77  Pulse 86 111 105    Orthostatics:  supine 100/60, 80.  Upright 90/64, 83.  Standing 94/68, 87  Physical Exam  Gen: Alert, well appearing.  Patient is oriented to person, place, time, and situation. AFFECT: pleasant, lucid thought and speech. [id number]: no injection, icteris, swelling, or exudate.  EOMI, PERRLA. Mouth: lips without lesion/swelling.  Oral mucosa pink and moist. Oropharynx without erythema, exudate, or swelling.  Neck: no bruits. Carotids 2+ bilat CV: RRR, no m/r/g.   LUNGS: CTA bilat, nonlabored resps, good aeration in all lung fields. EXT: no clubbing or cyanosis.  no edema.    LABS:  Last CBC Lab Results  Component Value Date   WBC 7.8 10/14/2021   HGB 12.4 10/14/2021   HCT 38 10/14/2021   MCV 81.7 07/31/2021   MCH 25.2 (L) 07/31/2021   RDW 15.9 (H) 07/31/2021   PLT 305 0000000   Last  metabolic panel Lab Results  Component Value Date   GLUCOSE 85 07/31/2021   NA 139 10/14/2021   K 4.3 10/14/2021   CL 103 10/14/2021   CO2 25 (A) 10/14/2021   BUN 12 10/14/2021   CREATININE 1.1 10/14/2021   GFRNONAA >60 07/31/2021   CALCIUM 9.6 10/14/2021   PROT 7.4 07/31/2021   ALBUMIN 4.7 10/14/2021   LABGLOB 2.6 05/14/2021   AGRATIO 1.7 05/14/2021   BILITOT 0.8 07/31/2021   ALKPHOS 69 10/14/2021  AST 48 (A) 10/14/2021   ALT 96 (A) 10/14/2021   ANIONGAP 10 07/31/2021   Last lipids Lab Results  Component Value Date   CHOL 264 (H) 05/14/2021   HDL 48 05/14/2021   LDLCALC 162 (H) 05/14/2021   TRIG 288 (H) 05/14/2021   CHOLHDL 5.5 (H) 05/14/2021   Last hemoglobin A1c Lab Results  Component Value Date   HGBA1C 5.3 05/14/2021   Last thyroid functions Lab Results  Component Value Date   TSH 3.10 01/28/2021   T3TOTAL 96 01/28/2021   IMPRESSION AND PLAN:  Hypotension.  She is intermittently symptomatic. She has had syncope on 1 occasion.  Rare palpitations. Recent electrolytes and CBC normal 4 days ago. Checking random glucose, magnesium, and TSH today.  I think her significant calorie restriction measures plus intentional weight loss are definitely playing a role here. Cautioned her that she may need to slow down the rate of her weight loss, restrict calories less. We will certainly keep her off of her antihypertensive.  7-day outpatient rhythm monitoring ordered. If her work-up is unrevealing and she is still the same in a few weeks at follow-up then we will ask cardiology to see her.  No new medications prescribed today.  Spent 30 min with pt today reviewing HPI, reviewing relevant past history, doing exam, reviewing and discussing lab and imaging data, and formulating plans.  An After Visit Summary was printed and given to the patient.  FOLLOW UP: Return in about 3 weeks (around 11/08/2021) for f/u dizziness and low bp.  Signed:  Crissie Sickles, MD            10/18/2021

## 2021-10-19 LAB — HEMOGLOBIN A1C
Hgb A1c MFr Bld: 5 % of total Hgb (ref <5.7)
Mean Plasma Glucose: 97 mg/dL
eAG (mmol/L): 5.4 mmol/L

## 2021-10-19 LAB — MAGNESIUM: Magnesium: 2.3 mg/dL (ref 1.5–2.5)

## 2021-10-19 LAB — TSH: TSH: 1.87 mIU/L

## 2021-10-19 LAB — GLUCOSE, RANDOM: Glucose, Plasma: 66 mg/dL (ref 65–139)

## 2021-10-21 ENCOUNTER — Ambulatory Visit
Admission: RE | Admit: 2021-10-21 | Discharge: 2021-10-21 | Disposition: A | Discharge status: Home / Self Care | Payer: No Typology Code available for payment source | Source: Ambulatory Visit | Attending: Internal Medicine | Admitting: Internal Medicine

## 2021-10-21 DIAGNOSIS — R7989 Other specified abnormal findings of blood chemistry: Secondary | ICD-10-CM

## 2021-10-31 HISTORY — PX: OTHER SURGICAL HISTORY: SHX169

## 2021-11-04 ENCOUNTER — Encounter (INDEPENDENT_AMBULATORY_CARE_PROVIDER_SITE_OTHER): Payer: Self-pay | Admitting: Adult Health

## 2021-11-04 ENCOUNTER — Other Ambulatory Visit (INDEPENDENT_AMBULATORY_CARE_PROVIDER_SITE_OTHER): Payer: Self-pay | Admitting: Adult Health

## 2021-11-04 MED ORDER — SAXENDA 18 MG/3ML ~~LOC~~ SOPN: 3.0000 mg | PEN_INJECTOR | Freq: Every day | SUBCUTANEOUS | 0 refills | Status: DC

## 2021-11-04 NOTE — Telephone Encounter (Signed)
Pt has f/u scheduled 11/13/21 please advise.

## 2021-11-04 NOTE — Telephone Encounter (Signed)
FYI

## 2021-11-11 ENCOUNTER — Encounter: Payer: Self-pay | Admitting: Family Medicine

## 2021-11-13 ENCOUNTER — Ambulatory Visit (INDEPENDENT_AMBULATORY_CARE_PROVIDER_SITE_OTHER): Payer: No Typology Code available for payment source | Admitting: Adult Health

## 2022-01-08 ENCOUNTER — Encounter (INDEPENDENT_AMBULATORY_CARE_PROVIDER_SITE_OTHER): Payer: Self-pay

## 2022-01-10 DIAGNOSIS — F319 Bipolar disorder, unspecified: Secondary | ICD-10-CM | POA: Diagnosis not present

## 2022-01-14 ENCOUNTER — Other Ambulatory Visit (INDEPENDENT_AMBULATORY_CARE_PROVIDER_SITE_OTHER): Payer: Self-pay | Admitting: Adult Health

## 2022-01-14 DIAGNOSIS — Z6835 Body mass index (BMI) 35.0-35.9, adult: Secondary | ICD-10-CM

## 2022-01-20 DIAGNOSIS — L4059 Other psoriatic arthropathy: Secondary | ICD-10-CM | POA: Diagnosis not present

## 2022-02-14 DIAGNOSIS — F319 Bipolar disorder, unspecified: Secondary | ICD-10-CM | POA: Diagnosis not present

## 2022-02-21 DIAGNOSIS — L448 Other specified papulosquamous disorders: Secondary | ICD-10-CM | POA: Diagnosis not present

## 2022-02-21 DIAGNOSIS — L409 Psoriasis, unspecified: Secondary | ICD-10-CM | POA: Diagnosis not present

## 2022-02-21 DIAGNOSIS — B078 Other viral warts: Secondary | ICD-10-CM | POA: Diagnosis not present

## 2022-03-06 DIAGNOSIS — F319 Bipolar disorder, unspecified: Secondary | ICD-10-CM | POA: Diagnosis not present

## 2022-04-04 DIAGNOSIS — F319 Bipolar disorder, unspecified: Secondary | ICD-10-CM | POA: Diagnosis not present

## 2022-04-17 DIAGNOSIS — B078 Other viral warts: Secondary | ICD-10-CM | POA: Diagnosis not present

## 2022-04-17 DIAGNOSIS — L448 Other specified papulosquamous disorders: Secondary | ICD-10-CM | POA: Diagnosis not present

## 2022-04-21 DIAGNOSIS — L4059 Other psoriatic arthropathy: Secondary | ICD-10-CM | POA: Diagnosis not present

## 2022-04-21 DIAGNOSIS — L400 Psoriasis vulgaris: Secondary | ICD-10-CM | POA: Diagnosis not present

## 2022-04-21 DIAGNOSIS — M5442 Lumbago with sciatica, left side: Secondary | ICD-10-CM | POA: Diagnosis not present

## 2022-04-21 DIAGNOSIS — Z79899 Other long term (current) drug therapy: Secondary | ICD-10-CM | POA: Diagnosis not present

## 2022-05-14 DIAGNOSIS — Z01419 Encounter for gynecological examination (general) (routine) without abnormal findings: Secondary | ICD-10-CM | POA: Diagnosis not present

## 2022-05-14 DIAGNOSIS — Z6841 Body Mass Index (BMI) 40.0 and over, adult: Secondary | ICD-10-CM | POA: Diagnosis not present

## 2022-06-05 ENCOUNTER — Other Ambulatory Visit (HOSPITAL_COMMUNITY): Payer: Self-pay

## 2022-06-25 DIAGNOSIS — F319 Bipolar disorder, unspecified: Secondary | ICD-10-CM | POA: Diagnosis not present

## 2022-07-24 DIAGNOSIS — Z13220 Encounter for screening for lipoid disorders: Secondary | ICD-10-CM | POA: Diagnosis not present

## 2022-07-24 DIAGNOSIS — L4059 Other psoriatic arthropathy: Secondary | ICD-10-CM | POA: Diagnosis not present

## 2022-07-24 DIAGNOSIS — Z79899 Other long term (current) drug therapy: Secondary | ICD-10-CM | POA: Diagnosis not present

## 2022-08-19 DIAGNOSIS — Z0289 Encounter for other administrative examinations: Secondary | ICD-10-CM

## 2022-08-21 ENCOUNTER — Encounter (INDEPENDENT_AMBULATORY_CARE_PROVIDER_SITE_OTHER): Payer: Self-pay | Admitting: Adult Health

## 2022-08-21 ENCOUNTER — Ambulatory Visit (INDEPENDENT_AMBULATORY_CARE_PROVIDER_SITE_OTHER): Payer: BC Managed Care – PPO | Admitting: Adult Health

## 2022-08-21 VITALS — BP 130/80 | HR 66 | Temp 98.3°F | Ht 63.0 in | Wt 240.0 lb

## 2022-08-21 DIAGNOSIS — Z6841 Body Mass Index (BMI) 40.0 and over, adult: Secondary | ICD-10-CM

## 2022-08-21 DIAGNOSIS — I10 Essential (primary) hypertension: Secondary | ICD-10-CM | POA: Diagnosis not present

## 2022-08-21 DIAGNOSIS — E88819 Insulin resistance, unspecified: Secondary | ICD-10-CM | POA: Diagnosis not present

## 2022-08-21 DIAGNOSIS — R0602 Shortness of breath: Secondary | ICD-10-CM | POA: Diagnosis not present

## 2022-08-21 DIAGNOSIS — E669 Obesity, unspecified: Secondary | ICD-10-CM

## 2022-08-21 DIAGNOSIS — R7303 Prediabetes: Secondary | ICD-10-CM | POA: Diagnosis not present

## 2022-08-21 DIAGNOSIS — K760 Fatty (change of) liver, not elsewhere classified: Secondary | ICD-10-CM

## 2022-08-21 DIAGNOSIS — Z0000 Encounter for general adult medical examination without abnormal findings: Secondary | ICD-10-CM

## 2022-08-21 MED ORDER — ZEPBOUND 2.5 MG/0.5ML ~~LOC~~ SOAJ: 2.5000 mg | SUBCUTANEOUS | 0 refills | Status: DC

## 2022-08-21 NOTE — Progress Notes (Addendum)
WEIGHT SUMMARY AND BIOMETRICS  Vitals Temp: 98.3 F (36.8 C) BP: 130/80 Pulse Rate: 66 SpO2: 97 %   Anthropometric Measurements Height: 5\' 3"  (1.6 m) Weight: 240 lb (108.9 kg) BMI (Calculated): 42.52 Weight at Last Visit: 193lb Weight Lost Since Last Visit: 0 Weight Gained Since Last Visit: 47lb Starting Weight: 201lb Total Weight Loss (lbs): 0 lb (0 kg)   Body Composition  Body Fat %: 46.9 % Fat Mass (lbs): 112.6 lbs Muscle Mass (lbs): 121 lbs Total Body Water (lbs): 84.4 lbs Visceral Fat Rating : 13   Other Clinical Data RMR: 1728 Fasting: Yes Labs: Yes Today's Visit #: 13 Starting Date: 01/23/21    Chief Complaint:   OBESITY Erin Flores is here to discuss her progress with her obesity treatment plan. She is on the the Category 2 Plan and states she is following her eating plan approximately 0 % of the time.  She states she is exercising at the gym 30-60 minutes 3-4 times per week.   Interim History:  Last OV at [place] was April 2023. Last dose of Saxenda 3mg  was June 2023- stopped due to significant bloating and constipation. She has previously been on Metformin and Mounjaro therapy- she reports best success on [facility] therapy.  She has been exercising vigorously at gym 30-60 mins 3-4 times per week, often prior to work.  She has also been buying fresh vegetables and fruits.  She has stopped cooking with butter.  She avoids red meat and prefers to consume lean chicken, pork.  01/23/2021 RMR 1440 08/21/2022 RMR 1728 Metabolism increased by 288 calories  Of Note- She reports 100% compliance with daily Slynd 4mg  oral birth control  Subjective:   1. SOB (shortness of breath) on exertion She endorses dyspnea with extreme exertion, denies CP. 01/23/2021 RMR 1440 08/21/2022 RMR 1728 Metabolism increased by 288 calories  2. Essential hypertension She stopped Irbesartan 150mg  due to sx's of hypotension. BP stable at OV today. She denies CP when  exercising.  3. Insulin resistance She has been on Metformin, Saxneda, and Mounjaro. Last dose of Saxenda 3mg  was June 2023- stopped due to significant bloating and constipation.  4. NAFLD (nonalcoholic fatty liver disease) She denies RUQ pain  5. Healthcare maintenance She endorses stable energy levels. She reports hx of chronic insomnia- currently taking Xanax and Trazodone to help fall and remain asleep.  Assessment/Plan:   1. SOB (shortness of breath) on exertion Check IC Re-start Cat 2 Meal Plan  2. Essential hypertension Limit Na+ Continue regular exercise Re-start Cat 2 Meal Plan  3. Insulin resistance Check Labs  4. NAFLD (nonalcoholic fatty liver disease) Check Labs  5. Healthcare maintenance Check Labs  6. Obesity with current BMI of 42.52  Start Zepbound 2.5mg  once weekly injection Disp 74ml RF 0  Use back up birth control when starting Zepbound and/or when increasing dosage.  Erin Flores is currently in the action stage of change. As such, her goal is to continue with weight loss efforts. She has agreed to the Category 2 Plan.   Exercise goals: For substantial health benefits, adults should do at least 150 minutes (2 hours and 30 minutes) a week of moderate-intensity, or 75 minutes (1 hour and 15 minutes) a week of vigorous-intensity aerobic physical activity, or an equivalent combination of moderate- and vigorous-intensity aerobic activity. Aerobic activity should be performed in episodes of at least 10 minutes, and preferably, it should be spread throughout the week.  Behavioral modification strategies: increasing lean protein intake, decreasing  simple carbohydrates, increasing vegetables, increasing water intake, no skipping meals, meal planning and cooking strategies, and planning for success.  Erin Flores has agreed to follow-up with our clinic in 3 weeks. She was informed of the importance of frequent follow-up visits to maximize her success with intensive  lifestyle modifications for her multiple health conditions.   Erin Flores was informed we would discuss her lab results at her next visit unless there is a critical issue that needs to be addressed sooner. Erin Flores agreed to keep her next visit at the agreed upon time to discuss these results.  Objective:   Blood pressure 130/80, pulse 66, temperature 98.3 F (36.8 C), height 5\' 3"  (1.6 m), weight 240 lb (108.9 kg), SpO2 97 %. Body mass index is 42.51 kg/m.  General: Cooperative, alert, well developed, in no acute distress. HEENT: Conjunctivae and lids unremarkable. Cardiovascular: Regular rhythm.  Lungs: Normal work of breathing. Neurologic: No focal deficits.   Lab Results  Component Value Date   CREATININE 1.1 10/14/2021   BUN 12 10/14/2021   NA 139 10/14/2021   K 4.3 10/14/2021   CL 103 10/14/2021   CO2 25 (A) 10/14/2021   Lab Results  Component Value Date   ALT 96 (A) 10/14/2021   AST 48 (A) 10/14/2021   ALKPHOS 69 10/14/2021   BILITOT 0.8 07/31/2021   Lab Results  Component Value Date   HGBA1C 5.0 10/18/2021   HGBA1C 5.3 05/14/2021   HGBA1C 5.5 01/23/2021   Lab Results  Component Value Date   INSULIN 6.8 05/14/2021   INSULIN 12.5 01/23/2021   Lab Results  Component Value Date   TSH 1.87 10/18/2021   Lab Results  Component Value Date   CHOL 264 (H) 05/14/2021   HDL 48 05/14/2021   LDLCALC 162 (H) 05/14/2021   TRIG 288 (H) 05/14/2021   CHOLHDL 5.5 (H) 05/14/2021   Lab Results  Component Value Date   VD25OH 51.0 01/23/2021   Lab Results  Component Value Date   WBC 7.8 10/14/2021   HGB 12.4 10/14/2021   HCT 38 10/14/2021   MCV 81.7 07/31/2021   PLT 305 10/14/2021   Lab Results  Component Value Date   IRON 94 05/14/2021   TIBC 539 (H) 05/14/2021   FERRITIN 21 05/14/2021   Attestation Statements:   Reviewed by clinician on day of visit: allergies, medications, problem list, medical history, surgical history, family history, social history, and  previous encounter notes.  I have reviewed the above documentation for accuracy and completeness, and I agree with the above. - Darrek Leasure d. Chiron Campione, NP-C

## 2022-08-22 LAB — COMPREHENSIVE METABOLIC PANEL
ALT: 68 IU/L — ABNORMAL HIGH (ref 0–32)
AST: 37 IU/L (ref 0–40)
Albumin/Globulin Ratio: 1.8 (ref 1.2–2.2)
Albumin: 4.8 g/dL (ref 3.9–4.9)
Alkaline Phosphatase: 75 IU/L (ref 44–121)
BUN/Creatinine Ratio: 12 (ref 9–23)
BUN: 11 mg/dL (ref 6–20)
Bilirubin Total: 0.5 mg/dL (ref 0.0–1.2)
CO2: 23 mmol/L (ref 20–29)
Calcium: 9.3 mg/dL (ref 8.7–10.2)
Chloride: 99 mmol/L (ref 96–106)
Creatinine, Ser: 0.93 mg/dL (ref 0.57–1.00)
Globulin, Total: 2.7 g/dL (ref 1.5–4.5)
Glucose: 82 mg/dL (ref 70–99)
Potassium: 4.2 mmol/L (ref 3.5–5.2)
Sodium: 139 mmol/L (ref 134–144)
Total Protein: 7.5 g/dL (ref 6.0–8.5)
eGFR: 81 mL/min/{1.73_m2} (ref >59)

## 2022-08-22 LAB — INSULIN, RANDOM: INSULIN: 15.2 u[IU]/mL (ref 2.6–24.9)

## 2022-08-22 LAB — VITAMIN D 25 HYDROXY (VIT D DEFICIENCY, FRACTURES): Vit D, 25-Hydroxy: 60.1 ng/mL (ref 30.0–100.0)

## 2022-08-22 LAB — HEMOGLOBIN A1C
Est. average glucose Bld gHb Est-mCnc: 120 mg/dL
Hgb A1c MFr Bld: 5.8 % — ABNORMAL HIGH (ref 4.8–5.6)

## 2022-08-22 LAB — VITAMIN B12: Vitamin B-12: 405 pg/mL (ref 232–1245)

## 2022-08-25 ENCOUNTER — Encounter (INDEPENDENT_AMBULATORY_CARE_PROVIDER_SITE_OTHER): Payer: Self-pay

## 2022-08-25 ENCOUNTER — Telehealth (INDEPENDENT_AMBULATORY_CARE_PROVIDER_SITE_OTHER): Payer: Self-pay | Admitting: Adult Health

## 2022-08-25 NOTE — Telephone Encounter (Addendum)
A PA was submitted today for Zepbound, waiting for a determination. 08/25/22  PA for Zepbound was denied. An appeal may be initiated by patient. Notified patient via mychart. 09/01/22

## 2022-09-16 ENCOUNTER — Encounter (INDEPENDENT_AMBULATORY_CARE_PROVIDER_SITE_OTHER): Payer: Self-pay | Admitting: Adult Health

## 2022-09-16 ENCOUNTER — Ambulatory Visit (INDEPENDENT_AMBULATORY_CARE_PROVIDER_SITE_OTHER): Payer: BC Managed Care – PPO | Admitting: Adult Health

## 2022-09-16 VITALS — BP 142/92 | HR 99 | Temp 98.5°F | Ht 63.0 in | Wt 241.0 lb

## 2022-09-16 DIAGNOSIS — I10 Essential (primary) hypertension: Secondary | ICD-10-CM

## 2022-09-16 DIAGNOSIS — K219 Gastro-esophageal reflux disease without esophagitis: Secondary | ICD-10-CM | POA: Diagnosis not present

## 2022-09-16 DIAGNOSIS — E669 Obesity, unspecified: Secondary | ICD-10-CM

## 2022-09-16 DIAGNOSIS — Z0000 Encounter for general adult medical examination without abnormal findings: Secondary | ICD-10-CM

## 2022-09-16 DIAGNOSIS — R7303 Prediabetes: Secondary | ICD-10-CM | POA: Diagnosis not present

## 2022-09-16 DIAGNOSIS — Z6841 Body Mass Index (BMI) 40.0 and over, adult: Secondary | ICD-10-CM

## 2022-09-16 MED ORDER — METFORMIN HCL 500 MG PO TABS: ORAL_TABLET | ORAL | 3 refills | Status: DC

## 2022-09-16 MED ORDER — METFORMIN HCL 500 MG PO TABS: ORAL_TABLET | ORAL | 0 refills | Status: DC

## 2022-09-16 MED ORDER — OMEPRAZOLE 20 MG PO CPDR: 20.0000 mg | DELAYED_RELEASE_CAPSULE | Freq: Every day | ORAL | 0 refills | Status: DC

## 2022-09-16 NOTE — Progress Notes (Signed)
WEIGHT SUMMARY AND BIOMETRICS  Vitals Temp: 98.5 F (36.9 C) BP: (!) 142/92 Pulse Rate: 99 SpO2: 98 %   Anthropometric Measurements Height:  (1.6 m) Weight: 241 lb (109.3 kg) BMI (Calculated): 42.7 Weight at Last Visit: 240lb Weight Lost Since Last Visit: 0 Weight Gained Since Last Visit: 1lb Starting Weight: 201lb Total Weight Loss (lbs): 0 lb (0 kg)   Body Composition  Body Fat %: 47 % Fat Mass (lbs): 113.2 lbs Muscle Mass (lbs): 121.4 lbs Total Body Water (lbs): 85.8 lbs Visceral Fat Rating : 13   Other Clinical Data Fasting: No Labs: No Today's Visit #: 14 Starting Date: 01/23/21    Chief Complaint:   OBESITY Erin Flores is here to discuss her progress with her obesity treatment plan. She is on the the Category 2 Plan and states she is following her eating plan approximately 65 % of the time.  She states she is exercising Cardiovascular Exercise/Weights 30-60 minutes 3/5 times per week.   Interim History:  Zepbound denied by [employer].  Erin Flores has continued regular exercise and at least 65% compliance on at Cat 2 meal plan. She reports profound polyphagia.  Subjective:   1. Gastroesophageal reflux disease, unspecified whether esophagitis present Erin Flores will experience dyspepsia in late afternoon. She denies tobacco/vape use.  2. Essential hypertension BP above goal. PCP stopped her Irbesartan  last year due to symptomatic hypotension. She denies CP with exertion. Dicussed Labs 08/21/2022 CMP- Electrolytes and kidney fx both stable.  3. Prediabetes Discussed Labs  Latest Reference Range & Units 08/21/22 11:54  Glucose 70 - 99 mg/dL 82  Hemoglobin Z6X 4.8 - 5.6 % 5.8 (H)  Est. average glucose Bld gHb Est-mCnc mg/dL 096  INSULIN 2.6 - 04.5 uIU/mL 15.2  (H): Data is abnormally high A1c and Insuline levels BOTH worsened- likely contributing to increased polyphagia. Discussed risks/benefits of Metformin therapy- agreeable to  re-start. 08/21/22 CMP: GFR >60  4. Healthcare maintenance Discussed Labs  Latest Reference Range & Units 08/21/22 11:54  Vitamin D, 25-Hydroxy 30.0 - 100.0 ng/mL 60.1  Vitamin B12 232 - 1,245 pg/mL 405    Latest Reference Range & Units 07/31/21 12:04 10/14/21 00:00 08/21/22 11:54  ALT 0 - 32 IU/L 84 (H) 96 ! (E) 68 (H)  (H): Data is abnormally high !: Data is abnormal (E): External lab result  ALT levels improving. She denies RUQ pain.  Assessment/Plan:   1. Gastroesophageal reflux disease, unspecified whether esophagitis present Refill - omeprazole (PRILOSEC) 20 MG capsule; Take 1 capsule (20 mg total) by mouth daily.  Dispense: 30 capsule; Refill: 0  2. Essential hypertension Decrease Na+ in diet Continue with weight loss efforts  3. Prediabetes Start Metformin  1/2 tab daily for one week, then increase to full tab daily Disp 30 RF 0  4. Healthcare maintenance Continue with weight loss efforts  5. Obesity with current BMI of 42.52  Erin Flores is currently in the action stage of change. As such, her goal is to continue with weight loss efforts. She has agreed to the Category 2 Plan.   Exercise goals: For substantial health benefits, adults should do at least 150 minutes (2 hours and 30 minutes) a week of moderate-intensity, or 75 minutes (1 hour and 15 minutes) a week of vigorous-intensity aerobic physical activity, or an equivalent combination of moderate- and vigorous-intensity aerobic activity. Aerobic activity should be performed in episodes of at least 10 minutes, and preferably, it should be spread throughout the week.  Behavioral modification strategies: increasing lean protein intake, decreasing simple carbohydrates, increasing vegetables, increasing water intake, increasing high fiber foods, no skipping meals, better snacking choices, and planning for success.  Erin Flores has agreed to follow-up with our clinic in 4 weeks. She was informed of the importance of  frequent follow-up visits to maximize her success with intensive lifestyle modifications for her multiple health conditions.    Objective:   Blood pressure (!) 142/92, pulse 99, temperature 98.5 F (36.9 C), height  (1.6 m), weight 241 lb (109.3 kg), SpO2 98 %. Body mass index is 42.69 kg/m.  General: Cooperative, alert, well developed, in no acute distress. HEENT: Conjunctivae and lids unremarkable. Cardiovascular: Regular rhythm.  Lungs: Normal work of breathing. Neurologic: No focal deficits.   Lab Results  Component Value Date   CREATININE 0.93 08/21/2022   BUN 11 08/21/2022   NA 139 08/21/2022   K 4.2 08/21/2022   CL 99 08/21/2022   CO2 23 08/21/2022   Lab Results  Component Value Date   ALT 68 (H) 08/21/2022   AST 37 08/21/2022   ALKPHOS 75 08/21/2022   BILITOT 0.5 08/21/2022   Lab Results  Component Value Date   HGBA1C 5.8 (H) 08/21/2022   HGBA1C 5.0 10/18/2021   HGBA1C 5.3 05/14/2021   HGBA1C 5.5 01/23/2021   Lab Results  Component Value Date   INSULIN 15.2 08/21/2022   INSULIN 6.8 05/14/2021   INSULIN 12.5 01/23/2021   Lab Results  Component Value Date   TSH 1.87 10/18/2021   Lab Results  Component Value Date   CHOL 264 (H) 05/14/2021   HDL 48 05/14/2021   LDLCALC 162 (H) 05/14/2021   TRIG 288 (H) 05/14/2021   CHOLHDL 5.5 (H) 05/14/2021   Lab Results  Component Value Date   VD25OH 60.1 08/21/2022   VD25OH 51.0 01/23/2021   Lab Results  Component Value Date   WBC 7.8 10/14/2021   HGB 12.4 10/14/2021   HCT 38 10/14/2021   MCV 81.7 07/31/2021   PLT 305 10/14/2021   Lab Results  Component Value Date   IRON 94 05/14/2021   TIBC 539 (H) 05/14/2021   FERRITIN 21 05/14/2021    Attestation Statements:   Reviewed by clinician on day of visit: allergies, medications, problem list, medical history, surgical history, family history, social history, and previous encounter notes.  I have reviewed the above documentation for accuracy and  completeness, and I agree with the above. -  Salvador Bigbee d. Erin Fults, NP-C

## 2022-09-18 DIAGNOSIS — F319 Bipolar disorder, unspecified: Secondary | ICD-10-CM | POA: Diagnosis not present

## 2022-10-03 DIAGNOSIS — L448 Other specified papulosquamous disorders: Secondary | ICD-10-CM | POA: Diagnosis not present

## 2022-10-03 DIAGNOSIS — B078 Other viral warts: Secondary | ICD-10-CM | POA: Diagnosis not present

## 2022-10-06 ENCOUNTER — Emergency Department (HOSPITAL_COMMUNITY): Payer: BC Managed Care – PPO

## 2022-10-06 ENCOUNTER — Encounter (INDEPENDENT_AMBULATORY_CARE_PROVIDER_SITE_OTHER): Payer: Self-pay | Admitting: Adult Health

## 2022-10-06 ENCOUNTER — Emergency Department (HOSPITAL_COMMUNITY)
Admission: EM | Admit: 2022-10-06 | Discharge: 2022-10-06 | Disposition: A | Discharge status: Home / Self Care | Payer: BC Managed Care – PPO | Attending: Emergency Medicine | Admitting: Emergency Medicine

## 2022-10-06 ENCOUNTER — Ambulatory Visit (INDEPENDENT_AMBULATORY_CARE_PROVIDER_SITE_OTHER): Payer: BC Managed Care – PPO | Admitting: Adult Health

## 2022-10-06 ENCOUNTER — Other Ambulatory Visit: Payer: Self-pay

## 2022-10-06 VITALS — BP 161/122 | HR 94 | Temp 98.3°F | Ht 63.0 in | Wt 240.0 lb

## 2022-10-06 DIAGNOSIS — R7303 Prediabetes: Secondary | ICD-10-CM

## 2022-10-06 DIAGNOSIS — K219 Gastro-esophageal reflux disease without esophagitis: Secondary | ICD-10-CM | POA: Diagnosis not present

## 2022-10-06 DIAGNOSIS — Z6841 Body Mass Index (BMI) 40.0 and over, adult: Secondary | ICD-10-CM

## 2022-10-06 DIAGNOSIS — R519 Headache, unspecified: Secondary | ICD-10-CM | POA: Insufficient documentation

## 2022-10-06 DIAGNOSIS — R079 Chest pain, unspecified: Secondary | ICD-10-CM

## 2022-10-06 DIAGNOSIS — R000 Tachycardia, unspecified: Secondary | ICD-10-CM | POA: Insufficient documentation

## 2022-10-06 DIAGNOSIS — E669 Obesity, unspecified: Secondary | ICD-10-CM

## 2022-10-06 DIAGNOSIS — R0789 Other chest pain: Secondary | ICD-10-CM | POA: Diagnosis not present

## 2022-10-06 DIAGNOSIS — I10 Essential (primary) hypertension: Secondary | ICD-10-CM | POA: Diagnosis not present

## 2022-10-06 DIAGNOSIS — G4489 Other headache syndrome: Secondary | ICD-10-CM | POA: Diagnosis not present

## 2022-10-06 DIAGNOSIS — Z79899 Other long term (current) drug therapy: Secondary | ICD-10-CM | POA: Diagnosis not present

## 2022-10-06 DIAGNOSIS — R0602 Shortness of breath: Secondary | ICD-10-CM | POA: Insufficient documentation

## 2022-10-06 DIAGNOSIS — R030 Elevated blood-pressure reading, without diagnosis of hypertension: Secondary | ICD-10-CM | POA: Diagnosis not present

## 2022-10-06 LAB — BASIC METABOLIC PANEL
Anion gap: 13 (ref 5–15)
BUN: 16 mg/dL (ref 6–20)
CO2: 24 mmol/L (ref 22–32)
Calcium: 10 mg/dL (ref 8.9–10.3)
Chloride: 98 mmol/L (ref 98–111)
Creatinine, Ser: 0.99 mg/dL (ref 0.44–1.00)
GFR, Estimated: >60 mL/min (ref >60)
Glucose, Bld: 89 mg/dL (ref 70–99)
Potassium: 3.4 mmol/L — ABNORMAL LOW (ref 3.5–5.1)
Sodium: 135 mmol/L (ref 135–145)

## 2022-10-06 LAB — CBC
HCT: 43.6 % (ref 36.0–46.0)
Hemoglobin: 14.2 g/dL (ref 12.0–15.0)
MCH: 27.8 pg (ref 26.0–34.0)
MCHC: 32.6 g/dL (ref 30.0–36.0)
MCV: 85.5 fL (ref 80.0–100.0)
Platelets: 371 10*3/uL (ref 150–400)
RBC: 5.1 MIL/uL (ref 3.87–5.11)
RDW: 14.6 % (ref 11.5–15.5)
WBC: 11.8 10*3/uL — ABNORMAL HIGH (ref 4.0–10.5)
nRBC: 0 % (ref 0.0–0.2)

## 2022-10-06 LAB — I-STAT BETA HCG BLOOD, ED (MC, WL, AP ONLY): I-stat hCG, quantitative: <5 m[IU]/mL (ref <5)

## 2022-10-06 LAB — BRAIN NATRIURETIC PEPTIDE: B Natriuretic Peptide: 8.6 pg/mL (ref 0.0–100.0)

## 2022-10-06 LAB — TROPONIN I (HIGH SENSITIVITY)
Troponin I (High Sensitivity): 3 ng/L (ref <18)
Troponin I (High Sensitivity): 3 ng/L (ref <18)

## 2022-10-06 MED ORDER — OMEPRAZOLE 20 MG PO CPDR
20.0000 mg | DELAYED_RELEASE_CAPSULE | Freq: Every day | ORAL | 0 refills | Status: DC
Start: 2022-10-06 — End: 2022-12-11

## 2022-10-06 MED ORDER — METFORMIN HCL 500 MG PO TABS: ORAL_TABLET | ORAL | 0 refills | Status: DC

## 2022-10-06 MED ORDER — AMLODIPINE BESYLATE 5 MG PO TABS: 5.0000 mg | ORAL_TABLET | Freq: Every day | ORAL | 0 refills | Status: DC

## 2022-10-06 NOTE — ED Triage Notes (Addendum)
Pt from PCP for eval of shortness of breath, chest tightness, and headache since yesterday. Hypertensive with hx of same. Has previously been on meds but made her hypotensive so was taken off of them. Pt also reports DOE but SOB even at rest and that her anxiety has been worse than normal today.

## 2022-10-06 NOTE — Discharge Instructions (Addendum)
We saw in the emergency room for elevated blood pressure.  Your BP is slightly high, however anxiety can falsely elevate your blood pressure as well.  You do appear slightly anxious right now to [place].  We are prescribing you some amlodipine.  We would recommend that for the next week, you keep a log of your blood pressure.  If the blood pressure consistently is high for Singh in the morning and before you go to bed every night, then start taking amlodipine.  Ideally you are seen by your primary care doctor who starts you on the correct antihypertensive medication.

## 2022-10-06 NOTE — ED Provider Triage Note (Signed)
Emergency Medicine Provider Triage Evaluation Note  TYE TRENHOLM , a 37 y.o. female  was evaluated in triage.  Pt complains of concerns for chest pain onset last night.  Notes that the chest pain is localized to the sternal region has been constant since its onset.  Has associated shortness of breath.  No meds tried prior to arrival.  Denies recent fall, injury, trauma.  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 Denies PMHx of MI. Pt denies recent travel, immobilization, surgery, estrogen use, birth control use, or PMHx of PE/DVT.   Review of Systems  Positive:  Negative:   Physical Exam  BP (!) 158/111   Pulse 99   Temp 97.9 F (36.6 C) (Oral)   Resp 18   SpO2 100%  Gen:   Awake, no distress   Resp:  Normal effort  MSK:   Moves extremities without difficulty  Other:  Able to speak in clear complete sentences.  No chest wall tenderness to palpation  Medical Decision Making  Medically screening exam initiated at 4:58 PM.  Appropriate orders placed.  ROXINE HEIDBRINK was informed that the remainder of the evaluation will be completed by another provider, this initial triage assessment does not replace that evaluation, and the importance of remaining in the ED until their evaluation is complete.  Workup initiated   Bradan Congrove A, PA-C 10/06/22 1705

## 2022-10-06 NOTE — Progress Notes (Addendum)
WEIGHT SUMMARY AND BIOMETRICS  Vitals Temp: 97.9 F (36.6 C) BP: (!) 158/111 Pulse Rate: 99 SpO2: 100 %   Anthropometric Measurements Height: 5\' 3"  (1.6 m) Weight: 240 lb (108.9 kg) BMI (Calculated): 42.52 Weight at Last Visit: 241lb Weight Lost Since Last Visit: 1lb Weight Gained Since Last Visit: 0 Starting Weight: 201lb Total Weight Loss (lbs): 0 lb (0 kg)   Body Composition  Body Fat %: 46.8 % Fat Mass (lbs): 112.6 lbs Muscle Mass (lbs): 121.8 lbs Total Body Water (lbs): 85.8 lbs Visceral Fat Rating : 13   Other Clinical Data Fasting: no Labs: no Today's Visit #: 15 Starting Date: 01/23/21    Chief Complaint:   OBESITY Erin Flores is here to discuss her progress with her obesity treatment plan. She is on the the Category 2 Plan and states she is following her eating plan approximately 85 % of the time.   Interim History:  Erin Flores reports dyspnea and CP since 0100. BP readings all elevated- via automated and manual cuff. Her father suffered MI Feb 2024- required PCI and stent.  EMA called to [facility] to transport her to nearest ED for evaulation  Subjective:   1. Chest pain, unspecified type Erin Flores reports dyspnea and CP since 0100. BP readings all elevated- via automated and manual cuff. Her father suffered MI Feb 2024- required PCI and stent.  She has not taken noon Adderall 20mg  dose  2. Essential hypertension Erin Flores reports dyspnea and CP since 0100. BP readings all elevated- via automated and manual cuff. Her father suffered MI Feb 2024- required PCI and stent.  She has not taken noon Adderall 20mg  dose  3. Prediabetes Lab Results  Component Value Date   HGBA1C 5.8 (H) 08/21/2022   HGBA1C 5.0 10/18/2021   HGBA1C 5.3 05/14/2021   She was started on Metformin 500mg  1/2 tab- has titrated up to full tab- denies GI upset. She has been on Metformin, Saxneda, and Mounjaro. Last dose of Saxenda 3mg  was June 2023- stopped due to  significant bloating and constipation. She would prefer to stop Metformin and restart Mounjaro therapy- if approved. She denies family hx of MEN 2 or MTC. She denies personal hx of pancreatitis.  4. Gastroesophageal reflux disease, unspecified whether esophagitis present Erin Flores reports increase in acid reflux and reports taking 1-2 Omeprazole 20mg  to reduce sx's.  Assessment/Plan:   1. Chest pain, unspecified type EMS called- pt transported to [facility]  2. Essential hypertension EMS called- pt transported to [facility] ED  3. Prediabetes Refill  metFORMIN (GLUCOPHAGE) 500 MG tablet 1 tab with meal Dispense: 30 tablet, Refills: 0 ordered   4. Gastroesophageal reflux disease, unspecified whether esophagitis present Refill - omeprazole (PRILOSEC) 20 MG capsule; Take 1 capsule (20 mg total) by mouth daily.  Dispense: 90 capsule; Refill: 0  5. Obesity with current BMI of 42.52 Re-start Zepbound 2.5mg  once weekly Disp 3ml RF0  Erin Flores is currently in the action stage of change. As such, her goal is to continue with weight loss efforts. She has agreed to the Category 2 Plan.   Exercise goals: No exercise has been prescribed at this time.  Behavioral modification strategies: increasing lean protein intake, decreasing simple carbohydrates, increasing vegetables, increasing water intake, no skipping meals, meal planning and cooking strategies, and planning for success.  Erin Flores has agreed to follow-up with our clinic in 4 weeks. She was informed of the importance of frequent follow-up visits to maximize her success with intensive lifestyle  modifications for her multiple health conditions.   Objective:   Blood pressure (!) 161/122, pulse 94, temperature 98.3 F (36.8 C), height 5\' 3"  (1.6 m), weight 240 lb (108.9 kg), SpO2 97 %. Body mass index is 42.51 kg/m.  General: Cooperative, alert, well developed, in no acute distress. HEENT: Conjunctivae and lids unremarkable. Cardiovascular:  Regular rhythm.  Lungs: Normal work of breathing. Neurologic: No focal deficits.   Lab Results  Component Value Date   CREATININE 0.93 08/21/2022   BUN 11 08/21/2022   NA 139 08/21/2022   K 4.2 08/21/2022   CL 99 08/21/2022   CO2 23 08/21/2022   Lab Results  Component Value Date   ALT 68 (H) 08/21/2022   AST 37 08/21/2022   ALKPHOS 75 08/21/2022   BILITOT 0.5 08/21/2022   Lab Results  Component Value Date   HGBA1C 5.8 (H) 08/21/2022   HGBA1C 5.0 10/18/2021   HGBA1C 5.3 05/14/2021   HGBA1C 5.5 01/23/2021   Lab Results  Component Value Date   INSULIN 15.2 08/21/2022   INSULIN 6.8 05/14/2021   INSULIN 12.5 01/23/2021   Lab Results  Component Value Date   TSH 1.87 10/18/2021   Lab Results  Component Value Date   CHOL 264 (H) 05/14/2021   HDL 48 05/14/2021   LDLCALC 162 (H) 05/14/2021   TRIG 288 (H) 05/14/2021   CHOLHDL 5.5 (H) 05/14/2021   Lab Results  Component Value Date   VD25OH 60.1 08/21/2022   VD25OH 51.0 01/23/2021   Lab Results  Component Value Date   WBC 7.8 10/14/2021   HGB 12.4 10/14/2021   HCT 38 10/14/2021   MCV 81.7 07/31/2021   PLT 305 10/14/2021   Lab Results  Component Value Date   IRON 94 05/14/2021   TIBC 539 (H) 05/14/2021   FERRITIN 21 05/14/2021   Attestation Statements:   Reviewed by clinician on day of visit: allergies, medications, problem list, medical history, surgical history, family history, social history, and previous encounter notes.  I have reviewed the above documentation for accuracy and completeness, and I agree with the above. -  Shantinique Picazo d. Ondrea Dow, NP-C

## 2022-10-08 ENCOUNTER — Ambulatory Visit: Payer: BC Managed Care – PPO | Admitting: Family Medicine

## 2022-10-09 ENCOUNTER — Encounter (INDEPENDENT_AMBULATORY_CARE_PROVIDER_SITE_OTHER): Payer: Self-pay | Admitting: Adult Health

## 2022-10-09 ENCOUNTER — Other Ambulatory Visit (INDEPENDENT_AMBULATORY_CARE_PROVIDER_SITE_OTHER): Payer: Self-pay | Admitting: Adult Health

## 2022-10-09 MED ORDER — METFORMIN HCL 500 MG PO TABS: ORAL_TABLET | ORAL | 0 refills | Status: DC

## 2022-10-10 ENCOUNTER — Encounter: Payer: Self-pay | Admitting: Family Medicine

## 2022-10-10 ENCOUNTER — Ambulatory Visit: Payer: BC Managed Care – PPO | Admitting: Family Medicine

## 2022-10-10 VITALS — BP 137/92 | HR 100 | Wt 243.8 lb

## 2022-10-10 DIAGNOSIS — I10 Essential (primary) hypertension: Secondary | ICD-10-CM

## 2022-10-10 MED ORDER — IRBESARTAN 150 MG PO TABS: ORAL_TABLET | ORAL | 1 refills | Status: DC

## 2022-10-10 NOTE — Progress Notes (Signed)
OFFICE VISIT  10/10/2022  CC:  Chief Complaint  Patient presents with   Acute Visit    Elevated BP. She has not picked up Amlodipine that was prescribed by ER Dr.    Patient is a 37 y.o. female who presents for hypertension.  HPI: Beloved says she became aware that her blood pressures were elevated again when she got reestablished with the bariatric/weight loss clinic about a month ago.  The elevations were mild at her first couple of visits but then the most recent 1 it was severely elevated so she was transported to the emergency department (10/06/22).   Full evaluation there consisted of normal physical exam except blood pressure was 153/79, heart rate 91.  Oxygen normal. CBC, basic metabolic panel, troponins, and BNP were normal.  EKG was normal. She was discharged home on amlodipine 5 mg a day.  She did not fill the amlodipine prescription.  She preferred to wait and talk to me first. She has a history of hypertension but I had discontinued her medication when she had problems in May 2023 with orthostatic dizziness and low blood pressure.  This was in the setting of significant purposeful weight loss. Unfortunately, she has gained this weight back. Currently she has no complaints other than feeling drained.  ROS as above, plus--> no fevers, no CP, no SOB, no wheezing, no cough, no dizziness, no HAs, no rashes, no melena/hematochezia.  No polyuria or polydipsia.  No myalgias or arthralgias.  No focal weakness, paresthesias, or tremors.  No acute vision or hearing abnormalities.  No dysuria or unusual/new urinary urgency or frequency.  No recent changes in lower legs. No n/v/d or abd pain.  No palpitations.     Past Medical History:  Diagnosis Date   Anemia    Anxiety and depression    Attention deficit disorder (ADD)    Bilateral lower extremity edema 2020/2021   Bipolar affective disorder (HCC)    question of:  pt saw psychiatrist and was on lamictal and tegretol aroun 2018;  takes clonoprin 2mg  prn for social anxiety.  he majory sx's were in the context of signif psychsocial probs with the father of her daughter.   Chest pain    Dizziness    outpt rhythm monitoring NL 10/2021   Duane syndrome of left eye    GERD (gastroesophageal reflux disease)    Herpes zoster 2017   History of iron deficiency anemia    History of pyelonephritis 2018   Hypertension    Migraines    "I take a lot of ibuprofen".  Worse on OCPs.  Gets 8 tension HA's a month and 1-2 migraines per month.   Psoriatic arthritis (HCC) 2014 dx   Enbrel 2020. Simponi 2021->?htn and edema rx?->stopped by Dr. Dierdre Forth 12/2019.  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 Procedure: LAPAROSCOPIC CHOLECYSTECTOMY WITH INTRAOPERATIVE CHOLANGIOGRAM;  Surgeon: Velora Heckler, MD;  Location: [facility];  Service: General;  Laterality: N/A;   Outpt rhythm monitoring  10/2021   no arrhythmia.  sx's assoc with sinus tach    Outpatient Medications Prior to Visit  Medication Sig Dispense Refill   ALPRAZolam (XANAX) 1 MG tablet Take 1 mg by mouth 2 (two) times daily as needed.     amphetamine-dextroamphetamine (ADDERALL) 20 MG tablet Take 20 mg by mouth in the morning, at noon, and at bedtime. Take last dose at 2pm, not at bedtime.  desvenlafaxine (PRISTIQ) 50 MG 24 hr tablet      Drospirenone (SLYND) 4 MG TABS Take by mouth.     FLUoxetine (PROZAC) 40 MG capsule Take by mouth daily.     metFORMIN (GLUCOPHAGE) 500 MG tablet 1 tab with meal 90 tablet 0   omeprazole (PRILOSEC) 20 MG capsule Take 1 capsule (20 mg total) by mouth daily. 90 capsule 0   traZODone (DESYREL) 100 MG tablet Take 100 mg by mouth at bedtime.     Upadacitinib ER 30 MG TB24 Take by mouth.     amLODipine (NORVASC) 5 MG tablet Take 1 tablet (5 mg total) by mouth daily. (Patient not taking: Reported on 10/10/2022) 14 tablet 0   No  facility-administered medications prior to visit.    Allergies  Allergen Reactions   Dilaudid [Hydromorphone Hcl] Other (See Comments)    Issues with urination   Simponi [Golimumab] Hypertension    ? HTN and LE edema    Review of Systems As per HPI  PE:    10/10/2022    2:42 PM 10/06/2022    9:00 PM 10/06/2022    8:45 PM  Vitals with BMI  Weight 243 lbs 13 oz    BMI 43.2    Systolic 137 153 409  Diastolic 92 79 98  Pulse 100 91 90     Physical Exam  Gen: Alert, well appearing.  Patient is oriented to person, place, time, and situation. AFFECT: pleasant, lucid thought and speech. CV: RRR (rate 100), no m/r/g.   LUNGS: CTA bilat, nonlabored resps, good aeration in all lung fields. EXT: no clubbing or cyanosis.  no edema.    LABS:  Last CBC Lab Results  Component Value Date   WBC 11.8 (H) 10/06/2022   HGB 14.2 10/06/2022   HCT 43.6 10/06/2022   MCV 85.5 10/06/2022   MCH 27.8 10/06/2022   RDW 14.6 10/06/2022   PLT 371 10/06/2022   Last metabolic panel Lab Results  Component Value Date   GLUCOSE 89 10/06/2022   NA 135 10/06/2022   K 3.4 (L) 10/06/2022   CL 98 10/06/2022   CO2 24 10/06/2022   BUN 16 10/06/2022   CREATININE 0.99 10/06/2022   GFRNONAA >60 10/06/2022   CALCIUM 10.0 10/06/2022   PROT 7.5 08/21/2022   ALBUMIN 4.8 08/21/2022   LABGLOB 2.7 08/21/2022   AGRATIO 1.8 08/21/2022   BILITOT 0.5 08/21/2022   ALKPHOS 75 08/21/2022   AST 37 08/21/2022   ALT 68 (H) 08/21/2022   ANIONGAP 13 10/06/2022   Last thyroid functions Lab Results  Component Value Date   TSH 1.87 10/18/2021   T3TOTAL 96 01/28/2021    IMPRESSION AND PLAN:  Uncontrolled hypertension. Will get her back on irbesartan 150 mg a day. Of note, she had dropped her weight down to 192 pounds 1 year ago.  She has regained this weight-->now 243 lbs. Continue to monitor blood pressure daily. Of note, her heart rate has been near the upper limit of normal for years.  An After Visit  Summary was printed and given to the patient.  FOLLOW UP: Return for 1-3 wks.  Signed:  Santiago Bumpers, MD           10/10/2022

## 2022-10-10 NOTE — ED Provider Notes (Signed)
[facility] EMERGENC[facility] DEPARTMENT AT [facility] Provider Note   CSN: [id number] Arrival date & time: 10/06/22  1649     History  Chief Complaint  Patient presents with   Shortness of Breath    Erin Flores is a 37 y.o. female.  HPI    37 year old woman comes in with chief complaint of elevated blood pressure, shortness of breath and chest discomfort.  Patient has history of urinary dyskinesia, hypertension.  Used to be on antihypertensives, but was taken off of them because of low blood pressure.  Patient states that he was having headache some exertional shortness of breath chest tightness.  She checked blood pressure, it was elevated and she was advised to come to the emergency room.  Blood pressure has been less than 180 systolic.  With the headaches, there are no associated vision change/loss, focal numbness, weakness, slurred speech and patient denies neck pain.  There is no substance use disease history.  Chest pain is atypical.  It is described as tightness, intermittently no specific evoking, aggravating leaving factors.  Shortness of breath is exertional, but at the moment she is also feeling short of breath just because of anxiety.  She normally takes Xanax.  Her anxiety typically does not present with the symptoms she is having right now.  Pt has no hx of PE, DVT and denies any exogenous hormone (testosterone / estrogen) use, long distance travels or surgery in the past 6 weeks, active cancer, recent immobilization.   Home Medications Prior to Admission medications   Medication Sig Start Date End Date Taking? Authorizing Provider  amLODipine (NORVASC) 5 MG tablet Take 1 tablet (5 mg total) by mouth daily. 10/06/22  Yes Derwood Kaplan, MD  ALPRAZolam Prudy Feeler) 1 MG tablet Take 1 mg by mouth 2 (two) times daily as needed. 01/27/21   [provider]  amphetamine-dextroamphetamine (ADDERALL) 20 MG tablet Take 20 mg by mouth in the morning, at noon, and at  bedtime. Take last dose at 2pm, not at bedtime.    [provider]  desvenlafaxine (PRISTIQ) 50 MG 24 hr tablet  05/14/22   [provider]  Drospirenone (SLYND) 4 MG TABS Take by mouth.    [provider]  FLUoxetine (PROZAC) 40 MG capsule Take by mouth daily. 10/16/21   [provider]  metFORMIN (GLUCOPHAGE) 500 MG tablet 1 tab with meal 10/09/22   Danford, Orpha Bur D, NP  omeprazole (PRILOSEC) 20 MG capsule Take 1 capsule (20 mg total) by mouth daily. 10/06/22   Danford, Orpha Bur D, NP  traZODone (DESYREL) 100 MG tablet Take 100 mg by mouth at bedtime.    [provider]  Upadacitinib ER 30 MG TB24 Take by mouth.    [provider]      Allergies    Dilaudid [hydromorphone hcl] and Simponi [golimumab]    Review of Systems   Review of Systems  All other systems reviewed and are negative.   Physical Exam Updated Vital Signs BP (!) 153/79   Pulse 91   Temp 97.9 F (36.6 C)   Resp 20   SpO2 100%  Physical Exam Vitals and nursing note reviewed.  Constitutional:      Appearance: She is well-developed.  HENT:     Head: Normocephalic and atraumatic.  Eyes:     Extraocular Movements: Extraocular movements intact.  Cardiovascular:     Rate and Rhythm: Normal rate.  Pulmonary:     Effort: Pulmonary effort is normal. No tachypnea or  respiratory distress.     Breath sounds: No decreased breath sounds, wheezing, rhonchi or rales.  Musculoskeletal:     Cervical back: Normal range of motion and neck supple.     Right lower leg: No tenderness. No edema.     Left lower leg: No tenderness. No edema.  Skin:    General: Skin is dry.  Neurological:     Mental Status: She is alert and oriented to person, place, and time.     Cranial Nerves: No cranial nerve deficit.     Motor: No weakness.     ED Results / Procedures / Treatments   Labs (all labs ordered are listed, but only abnormal results are displayed) Labs Reviewed  BASIC METABOLIC  PANEL - Abnormal; Notable for the following components:      Result Value   Potassium 3.4 (*)    All other components within normal limits  CBC - Abnormal; Notable for the following components:   WBC 11.8 (*)    All other components within normal limits  BRAIN NATRIURETIC PEPTIDE  I-STAT BETA HCG BLOOD, ED ([facility], [facility], [facility] ONLY)  TROPONIN I (HIGH SENSITIVITY)  TROPONIN I (HIGH SENSITIVITY)    EKG EKG Interpretation  Date/Time:  Monday Oct 06 2022 16:43:52 EDT Ventricular Rate:  89 PR Interval:  148 QRS Duration: 90 QT Interval:  378 QTC Calculation: 459 R Axis:   158 Text Interpretation: Normal sinus rhythm with sinus arrhythmia Right axis deviation Abnormal ECG When compared with ECG of 23-Dec-2019 12:05, PREVIOUS ECG IS PRESENT No acute changes No significant change since last tracing Confirmed by Derwood Kaplan [phone]) on 10/06/2022 9:11:37 PM  Radiology No results found.  Procedures Procedures    Medications Ordered in ED Medications - No data to display  ED Course/ Medical Decision Making/ A&P                             Medical Decision Making Risk Prescription drug management.   This patient presents to the ED with chief complaint(s) of elevated blood pressure, associated headaches, chest discomfort, shortness of breath with pertinent past medical history of anxiety, previous history of hypertension but currently not on any antihypertensives.The complaint involves an extensive differential diagnosis and also carries with it a high risk of complications and morbidity.    The differential diagnosis includes : Acute coronary syndrome, pulmonary embolism, CHF, pulmonary edema, pleural effusion, lung infection, severe anemia, essential hypertension/uncontrolled hypertension, hypertensive headache, hypertensive bleed.  The initial plan is to get basic labs, EKG.  Patient's exam is overall reassuring, but she is slightly tachycardic and mildly tachypneic at rest. Headaches  are mild, no associated neurodeficits low suspicion for brain bleed.  He is indicated.   Additional history obtained: Additional history obtained from Records reviewed  outpatient records, including review of patient's medications.  Independent labs interpretation:  The following labs were independently interpreted: BNP is less than 10.  High sensitive troponin is normal.  Patient has normal BNP and normal hemoglobin.  Independent visualization and interpretation of imaging: - I independently visualized the following imaging with scope of interpretation limited to determining acute life threatening conditions related to emergency care: X-ray of the chest, which revealed no evidence of pulmonary edema, pleural effusion.  Treatment and Reassessment: Patient reassessed.  Overall lower suspicion for PE based on exam.  Patient has no PE risk factors.  She is slightly tachycardic, but the confounder is that she is in  the emergency room and she has some anxiety.  There is no pleuritic chest pain.  Troponin and BNP are normal, further reducing our likelihood of clinically significant  Discussed the findings with the patient.  She feels comfortable with the workup here.  Will give her as needed blood pressure medication and advised to follow-up.  Advised that she takes blood pressure consistently and reports it.  Return precautions for worsening chest pain, shortness of breath discussed.   Final Clinical Impression(s) / ED Diagnoses Final diagnoses:  Hypertension, unspecified type    Rx / DC Orders ED Discharge Orders          Ordered    amLODipine (NORVASC) 5 MG tablet  Daily        10/06/22 2113              Derwood Kaplan, MD 10/10/22 0930

## 2022-10-15 ENCOUNTER — Encounter (INDEPENDENT_AMBULATORY_CARE_PROVIDER_SITE_OTHER): Payer: Self-pay

## 2022-10-16 ENCOUNTER — Telehealth (INDEPENDENT_AMBULATORY_CARE_PROVIDER_SITE_OTHER): Payer: Self-pay | Admitting: Adult Health

## 2022-10-16 MED ORDER — ZEPBOUND 2.5 MG/0.5ML ~~LOC~~ SOAJ: 2.5000 mg | SUBCUTANEOUS | 0 refills | Status: DC

## 2022-10-16 NOTE — Telephone Encounter (Signed)
PA for Zepbound was sent in and approved from 09/16/22 until 06/13/23, sent in to [facility] in [facility].Patient was notified by phone.

## 2022-10-16 NOTE — Addendum Note (Signed)
Addended by: William Hamburger D on: 10/16/2022 03:46 PM   Modules accepted: Orders

## 2022-10-20 NOTE — Telephone Encounter (Signed)
Can you please advise?

## 2022-10-22 DIAGNOSIS — L4059 Other psoriatic arthropathy: Secondary | ICD-10-CM | POA: Diagnosis not present

## 2022-10-22 DIAGNOSIS — Z79899 Other long term (current) drug therapy: Secondary | ICD-10-CM | POA: Diagnosis not present

## 2022-10-22 DIAGNOSIS — L400 Psoriasis vulgaris: Secondary | ICD-10-CM | POA: Diagnosis not present

## 2022-10-22 DIAGNOSIS — Z111 Encounter for screening for respiratory tuberculosis: Secondary | ICD-10-CM | POA: Diagnosis not present

## 2022-10-22 DIAGNOSIS — M5442 Lumbago with sciatica, left side: Secondary | ICD-10-CM | POA: Diagnosis not present

## 2022-11-02 ENCOUNTER — Other Ambulatory Visit: Payer: Self-pay | Admitting: Family Medicine

## 2022-11-03 ENCOUNTER — Ambulatory Visit (INDEPENDENT_AMBULATORY_CARE_PROVIDER_SITE_OTHER): Payer: BC Managed Care – PPO | Admitting: Adult Health

## 2022-11-03 ENCOUNTER — Encounter (INDEPENDENT_AMBULATORY_CARE_PROVIDER_SITE_OTHER): Payer: Self-pay | Admitting: Adult Health

## 2022-11-03 VITALS — BP 119/80 | HR 86 | Temp 98.0°F | Ht 63.0 in | Wt 234.0 lb

## 2022-11-03 DIAGNOSIS — E669 Obesity, unspecified: Secondary | ICD-10-CM | POA: Diagnosis not present

## 2022-11-03 DIAGNOSIS — I10 Essential (primary) hypertension: Secondary | ICD-10-CM

## 2022-11-03 DIAGNOSIS — Z6841 Body Mass Index (BMI) 40.0 and over, adult: Secondary | ICD-10-CM | POA: Diagnosis not present

## 2022-11-03 DIAGNOSIS — R7303 Prediabetes: Secondary | ICD-10-CM

## 2022-11-03 MED ORDER — ZEPBOUND 5 MG/0.5ML ~~LOC~~ SOAJ: 5.0000 mg | SUBCUTANEOUS | 0 refills | Status: DC

## 2022-11-03 NOTE — Progress Notes (Signed)
WEIGHT SUMMARY AND BIOMETRICS  Vitals Temp: 98 F (36.7 C) BP: 119/80 Pulse Rate: 86 SpO2: 98 %   Anthropometric Measurements Height: 5\' 3"  (1.6 m) Weight: 234 lb (106.1 kg) BMI (Calculated): 41.46 Weight at Last Visit: 240lb Weight Lost Since Last Visit: 6lb Weight Gained Since Last Visit: 0 Starting Weight: 201lb Total Weight Loss (lbs): 0 lb (0 kg)   Body Composition  Body Fat %: 46.9 % Fat Mass (lbs): 110 lbs Muscle Mass (lbs): 118.4 lbs Total Body Water (lbs): 84.2 lbs Visceral Fat Rating : 13   Other Clinical Data Fasting: no Labs: no Today's Visit #: 16 Starting Date: 01/23/21    Chief Complaint:   OBESITY Erin Flores is here to discuss her progress with her obesity treatment plan. She is on the the Category 2 Plan and states she is following her eating plan approximately 90 % of the time.  She states she is exercising Cardiovascular Exercise and Weight Training 30-60 minutes 3-5 times per week.   Interim History:   She started Zepbound 2.5mg  loading - has had 2 doses Denies mass in neck, dysphagia, dyspepsia, persistent hoarseness, abdominal pain, or N/V/C  She reports significant reduction in "food noise". She has continued to meal plan and prep. Her husband recently purchased "Me[employer]"- the family has greatly increased daily protein intake.  Subjective:   1. Prediabetes Lab Results  Component Value Date   HGBA1C 5.8 (H) 08/21/2022   HGBA1C 5.0 10/18/2021   HGBA1C 5.3 05/14/2021   She started Zepbound 2.5mg  loading - has had 2 doses Denies mass in neck, dysphagia, dyspepsia, persistent hoarseness, abdominal pain, or N/V/C   2. Essential hypertension PCP started her on Irbesartan 150mg  QD on 10/10/2022 Her BP is EXCELLENT at OV  Assessment/Plan:   1. Prediabetes 2 more doses of loading dose Zepbound then increase to 5mg  on 11/13/2022  2. Essential hypertension Continue daily Irbesartan 150mg   3. Obesity with current BMI of  42.52 Refill and increase  tirzepatide (ZEPBOUND) 5 MG/0.5ML Pen Inject 5 mg into the skin once a week. Dispense: 6 mL, Refills: 0 ordered    Erin Flores is currently in the action stage of change. As such, her goal is to continue with weight loss efforts. She has agreed to the Category 2 Plan.   Exercise goals: For substantial health benefits, adults should do at least 150 minutes (2 hours and 30 minutes) a week of moderate-intensity, or 75 minutes (1 hour and 15 minutes) a week of vigorous-intensity aerobic physical activity, or an equivalent combination of moderate- and vigorous-intensity aerobic activity. Aerobic activity should be performed in episodes of at least 10 minutes, and preferably, it should be spread throughout the week.  Behavioral modification strategies: increasing lean protein intake, decreasing simple carbohydrates, increasing vegetables, increasing water intake, increasing high fiber foods, no skipping meals, meal planning and cooking strategies, and planning for success.  Erin Flores has agreed to follow-up with our clinic in 4 weeks. She was informed of the importance of frequent follow-up visits to maximize her success with intensive lifestyle modifications for her multiple health conditions.   Objective:   Blood pressure 119/80, pulse 86, temperature 98 F (36.7 C), height 5\' 3"  (1.6 m), weight 234 lb (106.1 kg), SpO2 98 %. Body mass index is 41.45 kg/m.  General: Cooperative, alert, well developed, in no acute distress. HEENT: Conjunctivae and lids unremarkable. Cardiovascular: Regular rhythm.  Lungs: Normal work of breathing. Neurologic: No focal deficits.   Lab Results  Component  Value Date   CREATININE 0.99 10/06/2022   BUN 16 10/06/2022   NA 135 10/06/2022   K 3.4 (L) 10/06/2022   CL 98 10/06/2022   CO2 24 10/06/2022   Lab Results  Component Value Date   ALT 68 (H) 08/21/2022   AST 37 08/21/2022   ALKPHOS 75 08/21/2022   BILITOT 0.5 08/21/2022   Lab  Results  Component Value Date   HGBA1C 5.8 (H) 08/21/2022   HGBA1C 5.0 10/18/2021   HGBA1C 5.3 05/14/2021   HGBA1C 5.5 01/23/2021   Lab Results  Component Value Date   INSULIN 15.2 08/21/2022   INSULIN 6.8 05/14/2021   INSULIN 12.5 01/23/2021   Lab Results  Component Value Date   TSH 1.87 10/18/2021   Lab Results  Component Value Date   CHOL 264 (H) 05/14/2021   HDL 48 05/14/2021   LDLCALC 162 (H) 05/14/2021   TRIG 288 (H) 05/14/2021   CHOLHDL 5.5 (H) 05/14/2021   Lab Results  Component Value Date   VD25OH 60.1 08/21/2022   VD25OH 51.0 01/23/2021   Lab Results  Component Value Date   WBC 11.8 (H) 10/06/2022   HGB 14.2 10/06/2022   HCT 43.6 10/06/2022   MCV 85.5 10/06/2022   PLT 371 10/06/2022   Lab Results  Component Value Date   IRON 94 05/14/2021   TIBC 539 (H) 05/14/2021   FERRITIN 21 05/14/2021    Attestation Statements:   Reviewed by clinician on day of visit: allergies, medications, problem list, medical history, surgical history, family history, social history, and previous encounter notes.  I have reviewed the above documentation for accuracy and completeness, and I agree with the above. -  Tristram Milian d. Jadd Gasior, NP-C

## 2022-11-19 DIAGNOSIS — B078 Other viral warts: Secondary | ICD-10-CM | POA: Diagnosis not present

## 2022-11-19 DIAGNOSIS — L448 Other specified papulosquamous disorders: Secondary | ICD-10-CM | POA: Diagnosis not present

## 2022-12-06 ENCOUNTER — Other Ambulatory Visit: Payer: Self-pay | Admitting: Family Medicine

## 2022-12-10 ENCOUNTER — Other Ambulatory Visit: Payer: Self-pay | Admitting: Family Medicine

## 2022-12-11 ENCOUNTER — Ambulatory Visit (INDEPENDENT_AMBULATORY_CARE_PROVIDER_SITE_OTHER): Payer: BC Managed Care – PPO | Admitting: Adult Health

## 2022-12-11 ENCOUNTER — Encounter (INDEPENDENT_AMBULATORY_CARE_PROVIDER_SITE_OTHER): Payer: Self-pay | Admitting: Adult Health

## 2022-12-11 VITALS — BP 109/74 | HR 85 | Temp 98.0°F | Ht 63.0 in | Wt 223.0 lb

## 2022-12-11 DIAGNOSIS — K219 Gastro-esophageal reflux disease without esophagitis: Secondary | ICD-10-CM

## 2022-12-11 DIAGNOSIS — Z6839 Body mass index (BMI) 39.0-39.9, adult: Secondary | ICD-10-CM | POA: Diagnosis not present

## 2022-12-11 DIAGNOSIS — R7303 Prediabetes: Secondary | ICD-10-CM

## 2022-12-11 DIAGNOSIS — F319 Bipolar disorder, unspecified: Secondary | ICD-10-CM | POA: Diagnosis not present

## 2022-12-11 DIAGNOSIS — E669 Obesity, unspecified: Secondary | ICD-10-CM | POA: Diagnosis not present

## 2022-12-11 MED ORDER — ZEPBOUND 5 MG/0.5ML ~~LOC~~ SOAJ: 5.0000 mg | SUBCUTANEOUS | 0 refills | Status: DC

## 2022-12-11 MED ORDER — OMEPRAZOLE 20 MG PO CPDR
20.0000 mg | DELAYED_RELEASE_CAPSULE | Freq: Every day | ORAL | 0 refills | Status: DC
Start: 2022-12-11 — End: 2023-04-29

## 2022-12-11 NOTE — Progress Notes (Signed)
WEIGHT SUMMARY AND BIOMETRICS  Vitals Temp: 98 F (36.7 C) BP: 109/74 Pulse Rate: 85 SpO2: 96 %   Anthropometric Measurements Height: 5\' 3"  (1.6 m) Weight: 223 lb (101.2 kg) BMI (Calculated): 39.51 Weight at Last Visit: 234lb Weight Lost Since Last Visit: 11lb Weight Gained Since Last Visit: 0 Starting Weight: 201lb Total Weight Loss (lbs): 0 lb (0 kg)   Body Composition  Body Fat %: 44.5 % Fat Mass (lbs): 99.6 lbs Muscle Mass (lbs): 118 lbs Total Body Water (lbs): 78.6 lbs Visceral Fat Rating : 12   Other Clinical Data Fasting: Yes Labs: No Today's Visit #: 17 Starting Date: 01/23/21    Chief Complaint:   OBESITY Erin Flores is here to discuss her progress with her obesity treatment plan. She is on the the Category 2 Plan and states she is following her eating plan approximately 100 % of the time. She states she is exercising Walking on Vacation minutes.   Interim History:  Erin Flores travelled to [place] with her family for vacation.  She reports frequent walking during excursions.  Reviewed Bioimpedance results with pt. Muscle Mass -0.4 lb Adipose Mass -10.4 lbs  Of Note- Erin Flores restarted Zepboung 2.5mg  once weekly injection Increased from 2.5mg  to 5mg  on/about 11/13/2022  Denies mass in neck, dysphagia, dyspepsia, persistent hoarseness, abdominal pain, or N/V/C   Subjective:   1. Gastroesophageal reflux disease, unspecified whether esophagitis present Erin Flores restarted daily Omeprazole 20mg - she reports Acid Reflux sx's well controlled.  2. Prediabetes Lab Results  Component Value Date   HGBA1C 5.8 (H) 08/21/2022   HGBA1C 5.0 10/18/2021   HGBA1C 5.3 05/14/2021   Erin Flores restarted Zepboung 2.5mg  once weekly injection Increased from 2.5mg  to 5mg  on/about 11/13/2022  Denies mass in neck, dysphagia, dyspepsia, persistent hoarseness, abdominal pain, or N/V/C    Assessment/Plan:   1. Gastroesophageal reflux disease, unspecified  whether esophagitis present Refill - omeprazole (PRILOSEC) 20 MG capsule; Take 1 capsule (20 mg total) by mouth daily.  Dispense: 90 capsule; Refill: 0  2. Prediabetes Continue Zepboung 5mg  once weekly injection  3. Obesity with current BMI of 42.52 Refill- [facility] TO [facility] [facility]  tirzepatide (ZEPBOUND) 5 MG/0.5ML Pen Inject 5 mg into the skin once a week. Dispense: 6 mL, Refills: 0 ordered    Erin Flores is currently in the action stage of change. As such, her goal is to continue with weight loss efforts. She has agreed to the Category 2 Plan.   Exercise goals: For substantial health benefits, adults should do at least 150 minutes (2 hours and 30 minutes) a week of moderate-intensity, or 75 minutes (1 hour and 15 minutes) a week of vigorous-intensity aerobic physical activity, or an equivalent combination of moderate- and vigorous-intensity aerobic activity. Aerobic activity should be performed in episodes of at least 10 minutes, and preferably, it should be spread throughout the week.  Behavioral modification strategies: increasing lean protein intake, decreasing simple carbohydrates, increasing vegetables, increasing water intake, no skipping meals, meal planning and cooking strategies, and planning for success.  Erin Flores has agreed to follow-up with our clinic in 4 weeks. She was informed of the importance of frequent follow-up visits to maximize her success with intensive lifestyle modifications for her multiple health conditions.   Objective:   Blood pressure 109/74, pulse 85, temperature 98 F (36.7 C), height 5\' 3"  (1.6 m), weight 223 lb (101.2 kg), SpO2 96%. Body mass index is 39.5 kg/m.  General: Cooperative, alert,  well developed, in no acute distress. HEENT: Conjunctivae and lids unremarkable. Cardiovascular: Regular rhythm.  Lungs: Normal work of breathing. Neurologic: No focal deficits.   Lab Results  Component Value Date   CREATININE 0.99 10/06/2022   BUN  16 10/06/2022   NA 135 10/06/2022   K 3.4 (L) 10/06/2022   CL 98 10/06/2022   CO2 24 10/06/2022   Lab Results  Component Value Date   ALT 68 (H) 08/21/2022   AST 37 08/21/2022   ALKPHOS 75 08/21/2022   BILITOT 0.5 08/21/2022   Lab Results  Component Value Date   HGBA1C 5.8 (H) 08/21/2022   HGBA1C 5.0 10/18/2021   HGBA1C 5.3 05/14/2021   HGBA1C 5.5 01/23/2021   Lab Results  Component Value Date   INSULIN 15.2 08/21/2022   INSULIN 6.8 05/14/2021   INSULIN 12.5 01/23/2021   Lab Results  Component Value Date   TSH 1.87 10/18/2021   Lab Results  Component Value Date   CHOL 264 (H) 05/14/2021   HDL 48 05/14/2021   LDLCALC 162 (H) 05/14/2021   TRIG 288 (H) 05/14/2021   CHOLHDL 5.5 (H) 05/14/2021   Lab Results  Component Value Date   VD25OH 60.1 08/21/2022   VD25OH 51.0 01/23/2021   Lab Results  Component Value Date   WBC 11.8 (H) 10/06/2022   HGB 14.2 10/06/2022   HCT 43.6 10/06/2022   MCV 85.5 10/06/2022   PLT 371 10/06/2022   Lab Results  Component Value Date   IRON 94 05/14/2021   TIBC 539 (H) 05/14/2021   FERRITIN 21 05/14/2021   Attestation Statements:   Reviewed by clinician on day of visit: allergies, medications, problem list, medical history, surgical history, family history, social history, and previous encounter notes.  I have reviewed the above documentation for accuracy and completeness, and I agree with the above. -  Kaleena Corrow d. Liesel Peckenpaugh, NP-C

## 2022-12-15 ENCOUNTER — Encounter: Payer: Self-pay | Admitting: Family Medicine

## 2022-12-16 NOTE — Patient Instructions (Signed)
It was very nice to see you today!   PLEASE NOTE:   If you had any lab tests please let us know if you have not heard back within a few days. You may see your results on MyChart before we have a chance to review them but we will give you a call once they are reviewed by us. If we ordered any referrals today, please let us know if you have not heard from their office within the next 2 weeks. You should receive a letter via MyChart confirming if the referral was approved and their office contact information to schedule.  

## 2022-12-16 NOTE — Telephone Encounter (Signed)
Pt scheduled for follow up 7/19

## 2022-12-19 ENCOUNTER — Encounter: Payer: Self-pay | Admitting: Family Medicine

## 2022-12-19 ENCOUNTER — Ambulatory Visit (INDEPENDENT_AMBULATORY_CARE_PROVIDER_SITE_OTHER): Payer: BC Managed Care – PPO | Admitting: Family Medicine

## 2022-12-19 VITALS — BP 115/77 | HR 80 | Wt 224.4 lb

## 2022-12-19 DIAGNOSIS — I10 Essential (primary) hypertension: Secondary | ICD-10-CM | POA: Diagnosis not present

## 2022-12-19 MED ORDER — IRBESARTAN 150 MG PO TABS: ORAL_TABLET | ORAL | 3 refills | Status: DC

## 2022-12-19 MED ORDER — IRBESARTAN 150 MG PO TABS: ORAL_TABLET | ORAL | 0 refills | Status: DC

## 2022-12-19 NOTE — Progress Notes (Signed)
OFFICE VISIT  12/19/2022  CC:  Chief Complaint  Patient presents with   Hypertension    Follow up on BP. Needs refills    Patient is a 37 y.o. female who presents for 41mo f/u uncontrolled HTN. A/P as of last visit: "Uncontrolled hypertension. Will get her back on irbesartan 150 mg a day. Of note, she had dropped her weight down to 192 pounds 1 year ago.  She has regained this weight-->now 243 lbs. Continue to monitor blood pressure daily. Of note, her heart rate has been near the upper limit of normal for years."  INTERIM HX: Feeling well. Home bp consistently normal.   Past Medical History:  Diagnosis Date   Anemia    Anxiety and depression    Attention deficit disorder (ADD)    Bilateral lower extremity edema 2020/2021   Bipolar affective disorder (HCC)    question of:  pt saw psychiatrist and was on lamictal and tegretol aroun 2018; takes clonoprin 2mg  prn for social anxiety.  he majory sx's were in the context of signif psychsocial probs with the father of her daughter.   Chest pain    Dizziness    outpt rhythm monitoring NL 10/2021   Duane syndrome of left eye    GERD (gastroesophageal reflux disease)    Herpes zoster 2017   History of iron deficiency anemia    History of pyelonephritis 2018   Hypertension    Migraines    "I take a lot of ibuprofen".  Worse on OCPs.  Gets 8 tension HA's a month and 1-2 migraines per month.   Psoriatic arthritis (HCC) 2014 dx   Enbrel 2020. Simponi 2021->?htn and edema rx?->stopped by Dr. Dierdre Forth 12/2019.  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 Procedure: LAPAROSCOPIC CHOLECYSTECTOMY WITH INTRAOPERATIVE CHOLANGIOGRAM;  Surgeon: Velora Heckler, MD;  Location: [facility];  Service: General;  Laterality: N/A;   Outpt rhythm monitoring  10/2021   no arrhythmia.  sx's assoc with sinus tach    Outpatient Medications Prior to Visit   Medication Sig Dispense Refill   ALPRAZolam (XANAX) 1 MG tablet Take 1 mg by mouth 2 (two) times daily as needed.     amphetamine-dextroamphetamine (ADDERALL) 20 MG tablet Take 20 mg by mouth in the morning, at noon, and at bedtime. Take last dose at 2pm, not at bedtime.     desvenlafaxine (PRISTIQ) 50 MG 24 hr tablet      Drospirenone (SLYND) 4 MG TABS Take by mouth.     FLUoxetine (PROZAC) 40 MG capsule Take by mouth daily.     metFORMIN (GLUCOPHAGE) 500 MG tablet 1 tab with meal 90 tablet 0   omeprazole (PRILOSEC) 20 MG capsule Take 1 capsule (20 mg total) by mouth daily. 90 capsule 0   risankizumab-rzaa (SKYRIZI PEN) 150 MG/ML pen 150mg  Subcutaneous week 0, 4, then every 12weeks for 84 days     tirzepatide (ZEPBOUND) 5 MG/0.5ML Pen Inject 5 mg into the skin once a week. 6 mL 0   traZODone (DESYREL) 100 MG tablet Take 100 mg by mouth at bedtime.     irbesartan (AVAPRO) 150 MG tablet 1 tab p.o. daily 30 tablet 1   Upadacitinib ER 30 MG TB24 Take by mouth. (Patient not taking: Reported on 12/19/2022)     No facility-administered medications prior to visit.    Allergies  Allergen Reactions   Dilaudid [Hydromorphone Hcl] Other (See Comments)  Issues with urination   Simponi [Golimumab] Hypertension    ? HTN and LE edema    Review of Systems As per HPI  PE:    12/19/2022    3:14 PM 12/11/2022    9:00 AM 11/03/2022   12:00 PM  Vitals with BMI  Height  5\' 3"  5\' 3"   Weight 224 lbs 6 oz 223 lbs 234 lbs  BMI 39.76 39.51 41.46  Systolic 115 109 409  Diastolic 77 74 80  Pulse 80 85 86     Physical Exam  Gen: Alert, well appearing.  Patient is oriented to person, place, time, and situation. AFFECT: pleasant, lucid thought and speech. CV: RRR, no m/r/g.   LUNGS: CTA bilat, nonlabored resps, good aeration in all lung fields. EXT: no clubbing or cyanosis.  no edema.    LABS:  Last CBC Lab Results  Component Value Date   WBC 11.8 (H) 10/06/2022   HGB 14.2 10/06/2022   HCT  43.6 10/06/2022   MCV 85.5 10/06/2022   MCH 27.8 10/06/2022   RDW 14.6 10/06/2022   PLT 371 10/06/2022   Last metabolic panel Lab Results  Component Value Date   GLUCOSE 89 10/06/2022   NA 135 10/06/2022   K 3.4 (L) 10/06/2022   CL 98 10/06/2022   CO2 24 10/06/2022   BUN 16 10/06/2022   CREATININE 0.99 10/06/2022   GFRNONAA >60 10/06/2022   CALCIUM 10.0 10/06/2022   PROT 7.5 08/21/2022   ALBUMIN 4.8 08/21/2022   LABGLOB 2.7 08/21/2022   AGRATIO 1.8 08/21/2022   BILITOT 0.5 08/21/2022   ALKPHOS 75 08/21/2022   AST 37 08/21/2022   ALT 68 (H) 08/21/2022   ANIONGAP 13 10/06/2022   Last hemoglobin A1c Lab Results  Component Value Date   HGBA1C 5.8 (H) 08/21/2022   Last thyroid functions Lab Results  Component Value Date   TSH 1.87 10/18/2021   T3TOTAL 96 01/28/2021   IMPRESSION AND PLAN:  HTN, well controlled on irbesartan 150 mg every day. RF x 1 yr. She monitors bp regularly and will call/send message/return if they stay up persistently.   An After Visit Summary was printed and given to the patient.  FOLLOW UP: Return in about 1 year (around 12/19/2023) for annual CPE (fasting).  Signed:  Santiago Bumpers, MD           12/19/2022

## 2023-01-05 DIAGNOSIS — M5442 Lumbago with sciatica, left side: Secondary | ICD-10-CM | POA: Diagnosis not present

## 2023-01-05 DIAGNOSIS — L400 Psoriasis vulgaris: Secondary | ICD-10-CM | POA: Diagnosis not present

## 2023-01-05 DIAGNOSIS — M79672 Pain in left foot: Secondary | ICD-10-CM | POA: Diagnosis not present

## 2023-01-05 DIAGNOSIS — L4059 Other psoriatic arthropathy: Secondary | ICD-10-CM | POA: Diagnosis not present

## 2023-01-12 ENCOUNTER — Other Ambulatory Visit: Payer: Self-pay | Admitting: Family Medicine

## 2023-01-13 ENCOUNTER — Ambulatory Visit (INDEPENDENT_AMBULATORY_CARE_PROVIDER_SITE_OTHER): Payer: BC Managed Care – PPO | Admitting: Adult Health

## 2023-01-13 ENCOUNTER — Encounter (INDEPENDENT_AMBULATORY_CARE_PROVIDER_SITE_OTHER): Payer: Self-pay | Admitting: Adult Health

## 2023-01-13 VITALS — BP 124/84 | HR 98 | Temp 98.1°F | Ht 63.0 in | Wt 220.0 lb

## 2023-01-13 DIAGNOSIS — I10 Essential (primary) hypertension: Secondary | ICD-10-CM

## 2023-01-13 DIAGNOSIS — R7303 Prediabetes: Secondary | ICD-10-CM

## 2023-01-13 DIAGNOSIS — E669 Obesity, unspecified: Secondary | ICD-10-CM

## 2023-01-13 DIAGNOSIS — Z6835 Body mass index (BMI) 35.0-35.9, adult: Secondary | ICD-10-CM

## 2023-01-13 MED ORDER — ZEPBOUND 7.5 MG/0.5ML ~~LOC~~ SOAJ: 7.5000 mg | SUBCUTANEOUS | 0 refills | Status: DC

## 2023-01-13 NOTE — Progress Notes (Signed)
WEIGHT SUMMARY AND BIOMETRICS  Vitals Temp: 98.1 F (36.7 C) BP: 124/84 Pulse Rate: 98 SpO2: 99 %   Anthropometric Measurements Height: 5\' 3"  (1.6 m) Weight: 220 lb (99.8 kg) BMI (Calculated): 38.98 Weight at Last Visit: 223lb Weight Lost Since Last Visit: 3lb Weight Gained Since Last Visit: 0 Starting Weight: 201lb Total Weight Loss (lbs): 0 lb (0 kg)   Body Composition  Body Fat %: 45.7 % Fat Mass (lbs): 100.6 lbs Muscle Mass (lbs): 113.4 lbs Total Body Water (lbs): 79.4 lbs Visceral Fat Rating : 12   Other Clinical Data Fasting: no Labs: no Today's Visit #: 18 Starting Date: 01/23/21    Chief Complaint:   OBESITY Erin Flores is here to discuss her progress with her obesity treatment plan. She is on the the Category 2 Plan and states she is following her eating plan approximately 90 % of the time. She states she is not currently exercising.   Interim History:  10/06/2022 -Erin Flores restarted Zepboung 2.5mg  once weekly injection Increased from 2.5mg  to 5mg  on/about 11/13/2022  Denies mass in neck, dysphagia, dyspepsia, persistent hoarseness, abdominal pain, or N/V/C  She endorses increase in polyphagia the last 3 weeks  12/19/2022 Chronic Flores with her PCP/Dr. Milinda Cave  Subjective:   1. Essential hypertension BP at goal at OV She denies CP with exertion. She is on  irbesartan (AVAPRO) 150 MG tablet  PCP manages antihypertensive therapy  2. Prediabetes Lab Results  Component Value Date   HGBA1C 5.8 (H) 08/21/2022   HGBA1C 5.0 10/18/2021   HGBA1C 5.3 05/14/2021    Currently on weekly Zepbpind 5mg  Denies mass in neck, dysphagia, dyspepsia, persistent hoarseness, abdominal pain, or N/V/C  She endorses increase in polyphagia the last 3 weeks  Assessment/Plan:   1. Essential hypertension Continue daily Irbesartan 150mg    2. Prediabetes Reduce simple CHO and sugar intake  3. Obesity, Starting BMI 35.61 Refill and Increase tirzepatide  (ZEPBOUND) 7.5 MG/0.5ML Pen Inject 7.5 mg into the skin once a week. Dispense: 6 mL, Refills: 0 ordered   Erin Flores is currently in the action stage of change. As such, her goal is to continue with weight loss efforts. She has agreed to the Category 2 Plan.   Exercise goals: No exercise has been prescribed at this time.  Behavioral modification strategies: increasing lean protein intake, decreasing simple carbohydrates, increasing vegetables, increasing water intake, meal planning and cooking strategies, keeping healthy foods in the home, ways to avoid boredom eating, and planning for success.  Erin Flores with our clinic in 4 weeks. She was informed of the importance of frequent Flores visits to maximize her success with intensive lifestyle modifications for her multiple health conditions.   Objective:   Blood pressure 124/84, pulse 98, temperature 98.1 F (36.7 C), height 5\' 3"  (1.6 m), weight 220 lb (99.8 kg), SpO2 99%. Body mass index is 38.97 kg/m.  General: Cooperative, alert, well developed, in no acute distress. HEENT: Conjunctivae and lids unremarkable. Cardiovascular: Regular rhythm.  Lungs: Normal work of breathing. Neurologic: No focal deficits.   Lab Results  Component Value Date   CREATININE 0.99 10/06/2022   BUN 16 10/06/2022   NA 135 10/06/2022   K 3.4 (L) 10/06/2022   CL 98 10/06/2022   CO2 24 10/06/2022   Lab Results  Component Value Date   ALT 68 (H) 08/21/2022   AST 37 08/21/2022   ALKPHOS 75 08/21/2022   BILITOT 0.5 08/21/2022   Lab Results  Component  Value Date   HGBA1C 5.8 (H) 08/21/2022   HGBA1C 5.0 10/18/2021   HGBA1C 5.3 05/14/2021   HGBA1C 5.5 01/23/2021   Lab Results  Component Value Date   INSULIN 15.2 08/21/2022   INSULIN 6.8 05/14/2021   INSULIN 12.5 01/23/2021   Lab Results  Component Value Date   TSH 1.87 10/18/2021   Lab Results  Component Value Date   CHOL 264 (H) 05/14/2021   HDL 48 05/14/2021    LDLCALC 162 (H) 05/14/2021   TRIG 288 (H) 05/14/2021   CHOLHDL 5.5 (H) 05/14/2021   Lab Results  Component Value Date   VD25OH 60.1 08/21/2022   VD25OH 51.0 01/23/2021   Lab Results  Component Value Date   WBC 11.8 (H) 10/06/2022   HGB 14.2 10/06/2022   HCT 43.6 10/06/2022   MCV 85.5 10/06/2022   PLT 371 10/06/2022   Lab Results  Component Value Date   IRON 94 05/14/2021   TIBC 539 (H) 05/14/2021   FERRITIN 21 05/14/2021   Attestation Statements:   Reviewed by clinician on day of visit: allergies, medications, problem list, medical history, surgical history, family history, social history, and previous encounter notes.  I have reviewed the above documentation for accuracy and completeness, and I agree with the above. -  Erin Osuna d. Brennden Masten, NP-C

## 2023-01-14 ENCOUNTER — Other Ambulatory Visit: Payer: Self-pay

## 2023-01-14 ENCOUNTER — Ambulatory Visit: Payer: BC Managed Care – PPO | Admitting: Surgical

## 2023-01-14 ENCOUNTER — Telehealth: Payer: Self-pay | Admitting: Orthopedic Surgery

## 2023-01-14 DIAGNOSIS — M79672 Pain in left foot: Secondary | ICD-10-CM

## 2023-01-14 NOTE — Telephone Encounter (Signed)
Okay for this

## 2023-01-14 NOTE — Telephone Encounter (Signed)
Patient called and needed a doctor note just saying she needs to do light duty work. WU#[phone]

## 2023-01-15 ENCOUNTER — Encounter (HOSPITAL_BASED_OUTPATIENT_CLINIC_OR_DEPARTMENT_OTHER): Payer: Self-pay

## 2023-01-15 NOTE — Telephone Encounter (Signed)
I have sent the order to [facility] today

## 2023-01-15 NOTE — Telephone Encounter (Signed)
I called and advised pt that work note was placed in chart. 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 Forwarding to [place] as [employer]

## 2023-01-16 ENCOUNTER — Encounter: Payer: Self-pay | Admitting: Surgical

## 2023-01-16 NOTE — Progress Notes (Signed)
Office Visit Note   Patient: Erin Flores           Date of Birth: Jan 14, 1986           MRN: [id number] Visit Date: 01/14/2023 Requested by: Jeoffrey Massed, MD [id number] [place],  [place] [address] PCP: Jeoffrey Massed, MD  Subjective: Chief Complaint  Patient presents with   Left Foot - Pain    HPI: Erin Flores is a 37 y.o. female who presents to the office reporting left foot pain.  Patient states that she has continually worsening foot pain that she localizes to the distal aspect of the third metatarsal.  She states she has no history of injury to this region.  It began after she got back from vacation where she was not really doing any significant increase in her typical activity level.  This was about 2.5 weeks ago.  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  She states pain is worse at the end of the day.  He will occasionally radiate into the ankle.  No history of prior surgery.  She does have history of psoriatic arthritis for which she follows with Dr. Dierdre Forth.  Currently on Skyrizi and she has had 2 injections of this so far.  No numbness or tingling.  She had radiographs at her rheumatologist office about 9 days ago that she was told were negative for any acute abnormality..                ROS: All systems reviewed are negative as they relate to the chief complaint within the history of present illness.  Patient denies fevers or chills.  Assessment & Plan: Visit Diagnoses:  1. Pain in left foot     Plan: Patient is a 37 year old female who presents for evaluation of left foot pain.  Pain localizes to the distal aspect of the third metatarsal.  Has been progressively worsening over the last 2.5 weeks.  Does have history of psoriatic arthritis.  Main differential diagnosis would be stress reaction versus  stress fracture versus psoriatic arthropathy of the third MTP joint.  Radiographic findings demonstrate no obvious fracture.  Ultrasound evaluation of the third MTP joint versus the second MTP joint where she is nontender demonstrate no increased effusion.  Further evaluation of the distal aspect of the third metatarsal does demonstrate cortical irregularity in the area of maximal tenderness with focal area of hypoechoic fluid concerning for stress fracture; image attached in chart.  Plan to order MRI of the left foot stat to evaluate for stress fracture.  Recommended she ambulate in postop shoe.  She does have recent vitamin D level that was found to be above 50.  Plan to call after she has MRI done and we can discuss next steps.    Follow-Up Instructions: No follow-ups on file.   Orders:  Orders Placed This Encounter  Procedures   MR Foot Left w/o contrast   No orders of the defined types were placed in this encounter.     Procedures: No procedures performed   Clinical Data: No additional findings.  Objective: Vital Signs: There were no vitals taken for this visit.  Physical Exam:  Constitutional: Patient appears well-developed HEENT:  Head: Normocephalic Eyes:EOM are normal Neck: Normal range of motion Cardiovascular: Normal  rate Pulmonary/chest: Effort normal Neurologic: Patient is alert Skin: Skin is warm Psychiatric: Patient has normal mood and affect  Ortho Exam: Ortho exam demonstrates left foot with intact ankle dorsiflexion, plantarflexion, inversion, eversion.  She has no tenderness throughout the foot or ankle aside from over the distal aspect of the third metatarsal.  There is no bruising noted.  Minimal swelling compared with the contralateral side.  She has 2+ DP pulse of the left lower extremity.  No calf tenderness.  Negative Homans' sign.  Little bit of increased pain with resisted eversion that she localizes to the same area where her tenderness is.  No  tenderness over the Lisfranc complex.  Achilles tendon and anterior tibialis tendon are palpable and intact.  Specialty Comments:  No specialty comments available.  Imaging: No results found.   PMFS History: Patient Active Problem List   Diagnosis Date Noted   Transaminitis 08/06/2021   NAFLD (nonalcoholic fatty liver disease) 54/01/8118   Iron deficiency anemia 05/14/2021   Mixed hyperlipidemia 05/14/2021   Insulin resistance 02/25/2021   Essential hypertension 02/25/2021   Class 2 severe obesity with serious comorbidity and body mass index (BMI) of 35.0 to 35.9 in adult [facility]) 02/25/2021   Bipolar I disorder, most recent episode depressed (HCC)    Bilateral lower extremity edema 12/23/2019   Encounter for surveillance of Nexplanon subdermal contraceptive 11/05/2017   Bipolar affective, manic (HCC) 11/05/2017   Biliary dyskinesia 08/16/2013   Past Medical History:  Diagnosis Date   Anemia    Anxiety and depression    Attention deficit disorder (ADD)    Bilateral lower extremity edema 2020/2021   Bipolar affective disorder (HCC)    question of:  pt saw psychiatrist and was on lamictal and tegretol aroun 2018; takes clonoprin 2mg  prn for social anxiety.  he majory sx's were in the context of signif psychsocial probs with the father of her daughter.   Chest pain    Dizziness    outpt rhythm monitoring NL 10/2021   Duane syndrome of left eye    GERD (gastroesophageal reflux disease)    Herpes zoster 2017   History of iron deficiency anemia    History of pyelonephritis 2018   Hypertension    Migraines    "I take a lot of ibuprofen".  Worse on OCPs.  Gets 8 tension HA's a month and 1-2 migraines per month.   Psoriatic arthritis (HCC) 2014 dx   Enbrel 2020. Simponi 2021->?htn and edema rx?->stopped by Dr. Dierdre Forth 12/2019.  Cosentyx initiated 2021/2022    Family History  Problem Relation Age of Onset   Thyroid disease Mother    Alcohol abuse Mother    Cervical cancer Mother     Depression Mother    Drug abuse Mother    Hyperlipidemia Mother    Hypertension Mother    Learning disabilities Mother    Bipolar disorder Mother    Anxiety disorder Mother    Sleep apnea Mother    Alcohol abuse Father    Drug abuse Father    Hyperlipidemia Father    Hypertension Father    Alcohol abuse Sister    Depression Sister    Drug abuse Sister    Bipolar disorder Sister    Alcohol abuse Maternal Grandmother    Drug abuse Maternal Grandmother    Early death Maternal Grandmother        MVA - drunk driving   Alcohol abuse Maternal Grandfather    Drug abuse Maternal Grandfather  Early death Maternal Grandfather    Alcohol abuse Paternal Grandfather    Drug abuse Paternal Grandfather    Early death Paternal Grandfather    Depression Daughter    Learning disabilities Daughter    Cancer Maternal Aunt        breast    Past Surgical History:  Procedure Laterality Date   CHOLECYSTECTOMY N/A 08/19/2013   for biliary dyskinesea. Procedure: LAPAROSCOPIC CHOLECYSTECTOMY WITH INTRAOPERATIVE CHOLANGIOGRAM;  Surgeon: Velora Heckler, MD;  Location: [facility];  Service: General;  Laterality: N/A;   Outpt rhythm monitoring  10/2021   no arrhythmia.  sx's assoc with sinus tach   Social History   Occupational History   Not on file  Tobacco Use   Smoking status: Former    Current packs/day: 0.00    Average packs/day: 0.3 packs/day for 8.0 years (2.0 ttl pk-yrs)    Types: Cigarettes    Start date: 06/02/1998    Quit date: 06/02/2006    Years since quitting: 16.6   Smokeless tobacco: Never  Vaping Use   Vaping status: Never Used  Substance and Sexual Activity   Alcohol use: Yes    Comment: occasionally   Drug use: No   Sexual activity: Yes    Birth control/protection: Implant

## 2023-01-20 ENCOUNTER — Encounter (INDEPENDENT_AMBULATORY_CARE_PROVIDER_SITE_OTHER): Payer: Self-pay | Admitting: Adult Health

## 2023-01-21 DIAGNOSIS — M79672 Pain in left foot: Secondary | ICD-10-CM | POA: Diagnosis not present

## 2023-01-21 DIAGNOSIS — M19072 Primary osteoarthritis, left ankle and foot: Secondary | ICD-10-CM | POA: Diagnosis not present

## 2023-01-21 DIAGNOSIS — R600 Localized edema: Secondary | ICD-10-CM | POA: Diagnosis not present

## 2023-01-21 NOTE — Telephone Encounter (Signed)
Hey Autumn can you get her an appointment for 2 weeks to see Dr. August Saucer?  Also can you get her a work note basically saying she is okay for sedentary work with minimal walking for 2 weeks until reevaluation?   Thanks!

## 2023-01-21 NOTE — Telephone Encounter (Signed)
Note completed and called pt to schedule f/u with Dr. August Saucer

## 2023-02-04 ENCOUNTER — Ambulatory Visit: Payer: BC Managed Care – PPO | Admitting: Orthopedic Surgery

## 2023-02-04 ENCOUNTER — Encounter: Payer: Self-pay | Admitting: Orthopedic Surgery

## 2023-02-04 DIAGNOSIS — M84375D Stress fracture, left foot, subsequent encounter for fracture with routine healing: Secondary | ICD-10-CM

## 2023-02-04 NOTE — Progress Notes (Unsigned)
Office Visit Note   Patient: Erin Flores           Date of Birth: [birth year]           MRN: [id number] Visit Date: 02/04/2023 Requested by: Jeoffrey Massed, MD [id number] [place],  [place] [address] PCP: Jeoffrey Massed, MD  Subjective: Chief Complaint  Patient presents with   Other    Review MRI    HPI: Erin Flores is a 37 y.o. female who presents to the office reporting left foot pain.  The pain is slightly better.  She has been in a hard soled shoe.  Has not really been able to do sedentary duty.  She has been full weightbearing in that hard soled shoe.  Pain is not constant but still occurs daily.  Does have increased pain with activity.  Some tightness after activity.  She works as a Curator.  Her vitamin D level was 80 on 3/24.  MRI scan is reviewed with the patient.  Does show stress reaction/fracture in that third metatarsal..                ROS: All systems reviewed are negative as they relate to the chief complaint within the history of present illness.  Patient denies fevers or chills.  Assessment & Plan: Visit Diagnoses:  1. Stress fracture of metatarsal bone of left foot with routine healing, subsequent encounter     Plan: Impression is third metatarsal stress reaction/fracture.  Plan is the least amount of weightbearing as possible in that hard soled shoe.  Follow-up in 6 weeks for clinical recheck and repeat radiographs to see if we get some callus formation around that toe.  If we do that we could probably change her back to regular shoes.  Did give her prescription for knee scooter.  I think at work the patient would be nonweightbearing is much as possible for the next 6 weeks.  Follow-Up Instructions: No follow-ups on file.   Orders:  No orders of the defined types were placed in this encounter.  No orders of the defined types were placed in this encounter.     Procedures: No procedures  performed   Clinical Data: No additional findings.  Objective: Vital Signs: There were no vitals taken for this visit.  Physical Exam:  Constitutional: Patient appears well-developed HEENT:  Head: Normocephalic Eyes:EOM are normal Neck: Normal range of motion Cardiovascular: Normal rate Pulmonary/chest: Effort normal Neurologic: Patient is alert Skin: Skin is warm Psychiatric: Patient has normal mood and affect  Ortho Exam: Ortho exam demonstrates tenderness over the left third metatarsal.  No lesser toe deformities.  Mild pain with pronation supination of the forefoot.  No lesser toe deformities present.  Specialty Comments:  No specialty comments available.  Imaging: No results found.   PMFS History: Patient Active Problem List   Diagnosis Date Noted   Transaminitis 08/06/2021   NAFLD (nonalcoholic fatty liver disease) 84/69/6295   Iron deficiency anemia 05/14/2021   Mixed hyperlipidemia 05/14/2021   Insulin resistance 02/25/2021   Essential hypertension 02/25/2021   Class 2 severe obesity with serious comorbidity and body mass index (BMI) of 35.0 to 35.9 in adult [facility]) 02/25/2021   Bipolar I disorder, most recent episode depressed (HCC)    Bilateral lower extremity edema 12/23/2019   Encounter for surveillance of Nexplanon subdermal contraceptive 11/05/2017   Bipolar affective, manic (HCC) 11/05/2017   Biliary dyskinesia 08/16/2013  Past Medical History:  Diagnosis Date   Anemia    Anxiety and depression    Attention deficit disorder (ADD)    Bilateral lower extremity edema 2020/2021   Bipolar affective disorder (HCC)    question of:  pt saw psychiatrist and was on lamictal and tegretol aroun 2018; takes clonoprin 2mg  prn for social anxiety.  he majory sx's were in the context of signif psychsocial probs with the father of her daughter.   Chest pain    Dizziness    outpt rhythm monitoring NL 10/2021   Duane syndrome of left eye    GERD (gastroesophageal  reflux disease)    Herpes zoster 2017   History of iron deficiency anemia    History of pyelonephritis 2018   Hypertension    Migraines    "I take a lot of ibuprofen".  Worse on OCPs.  Gets 8 tension HA's a month and 1-2 migraines per month.   Psoriatic arthritis (HCC) 2014 dx   Enbrel 2020. Simponi 2021->?htn and edema rx?->stopped by Dr. Dierdre Forth 12/2019.  Cosentyx initiated 2021/2022    Family History  Problem Relation Age of Onset   Thyroid disease Mother    Alcohol abuse Mother    Cervical cancer Mother    Depression Mother    Drug abuse Mother    Hyperlipidemia Mother    Hypertension Mother    Learning disabilities Mother    Bipolar disorder Mother    Anxiety disorder Mother    Sleep apnea Mother    Alcohol abuse Father    Drug abuse Father    Hyperlipidemia Father    Hypertension Father    Alcohol abuse Sister    Depression Sister    Drug abuse Sister    Bipolar disorder Sister    Alcohol abuse Maternal Grandmother    Drug abuse Maternal Grandmother    Early death Maternal Grandmother        MVA - drunk driving   Alcohol abuse Maternal Grandfather    Drug abuse Maternal Grandfather    Early death Maternal Grandfather    Alcohol abuse Paternal Grandfather    Drug abuse Paternal Grandfather    Early death Paternal Grandfather    Depression Daughter    Learning disabilities Daughter    Cancer Maternal Aunt        breast    Past Surgical History:  Procedure Laterality Date   CHOLECYSTECTOMY N/A 08/19/2013   for biliary dyskinesea. Procedure: LAPAROSCOPIC CHOLECYSTECTOMY WITH INTRAOPERATIVE CHOLANGIOGRAM;  Surgeon: Velora Heckler, MD;  Location: [facility];  Service: General;  Laterality: N/A;   Outpt rhythm monitoring  10/2021   no arrhythmia.  sx's assoc with sinus tach   Social History   Occupational History   Not on file  Tobacco Use   Smoking status: Former    Current packs/day: 0.00    Average packs/day: 0.3 packs/day for 8.0 years (2.0 ttl pk-yrs)     Types: Cigarettes    Start date: 06/02/1998    Quit date: 06/02/2006    Years since quitting: 16.6   Smokeless tobacco: Never  Vaping Use   Vaping status: Never Used  Substance and Sexual Activity   Alcohol use: Yes    Comment: occasionally   Drug use: No   Sexual activity: Yes    Birth control/protection: Implant

## 2023-02-06 DIAGNOSIS — Z79899 Other long term (current) drug therapy: Secondary | ICD-10-CM | POA: Diagnosis not present

## 2023-02-06 DIAGNOSIS — L400 Psoriasis vulgaris: Secondary | ICD-10-CM | POA: Diagnosis not present

## 2023-02-06 DIAGNOSIS — L4059 Other psoriatic arthropathy: Secondary | ICD-10-CM | POA: Diagnosis not present

## 2023-02-06 DIAGNOSIS — M5442 Lumbago with sciatica, left side: Secondary | ICD-10-CM | POA: Diagnosis not present

## 2023-02-06 LAB — HEPATIC FUNCTION PANEL
ALT: 81 U/L — AB (ref 7–35)
AST: 44 — AB (ref 13–35)
Alkaline Phosphatase: 116 (ref 25–125)
Bilirubin, Total: 0.6

## 2023-02-06 LAB — BASIC METABOLIC PANEL
BUN: 12 (ref 4–21)
CO2: 23 — AB (ref 13–22)
Chloride: 99 (ref 99–108)
Creatinine: 0.9 (ref 0.5–1.1)
Glucose: 130
Potassium: 4.5 meq/L (ref 3.5–5.1)
Sodium: 138 (ref 137–147)

## 2023-02-06 LAB — CBC AND DIFFERENTIAL
HCT: 44 (ref 36–46)
Hemoglobin: 14.2 (ref 12.0–16.0)
Neutrophils Absolute: 6.7
WBC: 9.5

## 2023-02-06 LAB — COMPREHENSIVE METABOLIC PANEL
Albumin: 4.7 (ref 3.5–5.0)
Calcium: 9.8 (ref 8.7–10.7)
eGFR: 86

## 2023-02-06 LAB — CBC: RBC: 5.11 (ref 3.87–5.11)

## 2023-02-09 LAB — COMPREHENSIVE METABOLIC PANEL: Globulin: 2.6

## 2023-02-16 ENCOUNTER — Ambulatory Visit (INDEPENDENT_AMBULATORY_CARE_PROVIDER_SITE_OTHER): Payer: BC Managed Care – PPO | Admitting: Adult Health

## 2023-02-16 ENCOUNTER — Encounter (INDEPENDENT_AMBULATORY_CARE_PROVIDER_SITE_OTHER): Payer: Self-pay | Admitting: Adult Health

## 2023-02-16 VITALS — BP 108/76 | HR 90 | Temp 97.6°F | Ht 63.0 in | Wt 212.0 lb

## 2023-02-16 DIAGNOSIS — Z6837 Body mass index (BMI) 37.0-37.9, adult: Secondary | ICD-10-CM

## 2023-02-16 DIAGNOSIS — I10 Essential (primary) hypertension: Secondary | ICD-10-CM

## 2023-02-16 DIAGNOSIS — R7303 Prediabetes: Secondary | ICD-10-CM | POA: Diagnosis not present

## 2023-02-16 DIAGNOSIS — E669 Obesity, unspecified: Secondary | ICD-10-CM

## 2023-02-16 DIAGNOSIS — E559 Vitamin D deficiency, unspecified: Secondary | ICD-10-CM | POA: Diagnosis not present

## 2023-02-16 DIAGNOSIS — E88819 Insulin resistance, unspecified: Secondary | ICD-10-CM

## 2023-02-16 DIAGNOSIS — Z87891 Personal history of nicotine dependence: Secondary | ICD-10-CM

## 2023-02-16 DIAGNOSIS — K760 Fatty (change of) liver, not elsewhere classified: Secondary | ICD-10-CM | POA: Diagnosis not present

## 2023-02-16 MED ORDER — ZEPBOUND 7.5 MG/0.5ML ~~LOC~~ SOAJ: 7.5000 mg | SUBCUTANEOUS | 0 refills | Status: DC

## 2023-02-16 NOTE — Progress Notes (Addendum)
WEIGHT SUMMARY AND BIOMETRICS  Vitals Temp: 97.6 F (36.4 C) BP: 108/76 Pulse Rate: 90 SpO2: 96 %   Anthropometric Measurements Height: 5\' 3"  (1.6 m) Weight: 212 lb (96.2 kg) BMI (Calculated): 37.56 Weight at Last Visit: 220lb Weight Lost Since Last Visit: 8lb Weight Gained Since Last Visit: 0 Starting Weight: 201lb Total Weight Loss (lbs): 0 lb (0 kg)   Body Composition  Body Fat %: 45.3 % Fat Mass (lbs): 96.4 lbs Muscle Mass (lbs): 110.4 lbs Total Body Water (lbs): 77.6 lbs Visceral Fat Rating : 11   Other Clinical Data Fasting: yes Labs: no Today's Visit #: 19 Starting Date: 01/23/21    Chief Complaint:   OBESITY Erin Flores is here to discuss her progress with her obesity treatment plan. She is on the the Category 2 Plan and states she is following her eating plan approximately 80 % of the time. She states she is not currently exercising.   Interim History:  10/06/2022 -Erin Flores restarted Zepboung 2.5mg  once weekly injection Increased from 2.5mg  to 5mg  on/about 11/13/2022  Due to increased polyphagia - Zepbound increased to 7.5mg  on/about 01/13/2023  She has had 4 doses at the increased strength  Denies mass in neck, dysphagia, dyspepsia, persistent hoarseness, abdominal pain, or N/V/C   Hunger/appetite-she reports excellent appetite control with recent increase of Zepbound therapy  Exercise- Unable to exercise due to  Stress fracture of metatarsal bone of left foot   Subjective:   1. NAFLD (nonalcoholic fatty liver disease) Discussed Labs  Latest Reference Range & Units 07/31/21 12:04 10/14/21 00:00 08/21/22 11:54 02/06/23 00:00  AST 13 - 35  32 48 ! (E) 37 44 ! (E)  ALT 7 - 35 U/L 84 (H) 96 ! (E) 68 (H) 81 ! (E)  !: Data is abnormal (H): Data is abnormally high (E): External lab result  10/22/2022 US Abdomen Limited RUQ  CLINICAL DATA:  Elevated LFTs   EXAM: ULTRASOUND ABDOMEN LIMITED RIGHT UPPER QUADRANT   COMPARISON:  Abdominal  ultrasound 08/05/2013   FINDINGS: Gallbladder:   Surgically absent.   Common bile duct:   Diameter: 5 mm   Liver:   Increased echogenicity of the liver parenchyma with no focal mass identified. Portal vein is patent on color Doppler imaging with normal direction of blood flow towards the liver.   Other: None.   IMPRESSION: Increased echogenicity of the liver parenchyma which may represent hepatic steatosis and/or other hepatocellular disease.   2. Prediabetes Lab Results  Component Value Date   HGBA1C 5.8 (H) 08/21/2022   HGBA1C 5.0 10/18/2021   HGBA1C 5.3 05/14/2021   10/06/2022 -Erin Flores restarted Zepboung 2.5mg  once weekly injection Increased from 2.5mg  to 5mg  on/about 11/13/2022  Due to increased polyphagia - Zepbound increased to 7.5mg  on/about 01/13/2023  She has remained on daily Metformin 500mg  - denies GI upset  3. Insulin resistance  Latest Reference Range & Units 08/21/22 11:54  INSULIN 2.6 - 24.9 uIU/mL 15.2  10/06/2022 -Erin Flores restarted Zepboung 2.5mg  once weekly injection Increased from 2.5mg  to 5mg  on/about 11/13/2022  Due to increased polyphagia - Zepbound increased to 7.5mg  on/about 01/13/2023  She has remained on daily Metformin 500mg  - denies GI upset  4. Vitamin D deficiency  Latest Reference Range & Units 08/21/22 11:54  Vitamin D, 25-Hydroxy 30.0 - 100.0 ng/mL 60.1   She reports recent increase in exhaustion and fatigue- she has been unable to exercise due to acute L foot stress fx  5. Essential hypertension BP at  goal at OV She is on daily irbesartan (AVAPRO) 150 MG tablet and weekly Zepbound 7.5mg  injection  Assessment/Plan:   1. NAFLD (nonalcoholic fatty liver disease) Continue with weight loss efforts  2. Prediabetes Check Labs - Hemoglobin A1c - Insulin, random  3. Insulin resistance Continue healthy eating and current Rx regime  4. Vitamin D deficiency Check Labs - VITAMIN D 25 Hydroxy (Vit-D Deficiency, Fractures)  5.  Essential hypertension Continue healthy eating and current Rx regime  6. Obesity with current BMI of 37.56 Refill tirzepatide (ZEPBOUND) 7.5 MG/0.5ML Pen Inject 7.5 mg into the skin once a week. Dispense: 6 mL, Refills: 0 ordered   Erin Flores is currently in the action stage of change. As such, her goal is to continue with weight loss efforts. She has agreed to the Category 2 Plan.   Exercise goals: No exercise has been prescribed at this time.  Behavioral modification strategies: increasing lean protein intake, decreasing simple carbohydrates, increasing vegetables, increasing water intake, decreasing eating out, no skipping meals, meal planning and cooking strategies, keeping healthy foods in the home, ways to avoid boredom eating, and planning for success.  Erin Flores has agreed to follow-up with our clinic in 4 weeks. She was informed of the importance of frequent follow-up visits to maximize her success with intensive lifestyle modifications for her multiple health conditions.   Erin Flores was informed we would discuss her lab results at her next visit unless there is a critical issue that needs to be addressed sooner. Erin Flores agreed to keep her next visit at the agreed upon time to discuss these results.  Objective:   Blood pressure 108/76, pulse 90, temperature 97.6 F (36.4 C), height 5\' 3"  (1.6 m), weight 212 lb (96.2 kg), SpO2 96%. Body mass index is 37.55 kg/m.  General: Cooperative, alert, well developed, in no acute distress. HEENT: Conjunctivae and lids unremarkable. Cardiovascular: Regular rhythm.  Lungs: Normal work of breathing. Neurologic: No focal deficits.   Lab Results  Component Value Date   CREATININE 0.9 02/06/2023   BUN 12 02/06/2023   NA 138 02/06/2023   K 4.5 02/06/2023   CL 99 02/06/2023   CO2 23 (A) 02/06/2023   Lab Results  Component Value Date   ALT 81 (A) 02/06/2023   AST 44 (A) 02/06/2023   ALKPHOS 116 02/06/2023   BILITOT 0.5 08/21/2022   Lab  Results  Component Value Date   HGBA1C 5.8 (H) 08/21/2022   HGBA1C 5.0 10/18/2021   HGBA1C 5.3 05/14/2021   HGBA1C 5.5 01/23/2021   Lab Results  Component Value Date   INSULIN 15.2 08/21/2022   INSULIN 6.8 05/14/2021   INSULIN 12.5 01/23/2021   Lab Results  Component Value Date   TSH 1.87 10/18/2021   Lab Results  Component Value Date   CHOL 264 (H) 05/14/2021   HDL 48 05/14/2021   LDLCALC 162 (H) 05/14/2021   TRIG 288 (H) 05/14/2021   CHOLHDL 5.5 (H) 05/14/2021   Lab Results  Component Value Date   VD25OH 60.1 08/21/2022   VD25OH 51.0 01/23/2021   Lab Results  Component Value Date   WBC 9.5 02/06/2023   HGB 14.2 02/06/2023   HCT 44 02/06/2023   MCV 85.5 10/06/2022   PLT 371 10/06/2022   Lab Results  Component Value Date   IRON 94 05/14/2021   TIBC 539 (H) 05/14/2021   FERRITIN 21 05/14/2021    Attestation Statements:   Reviewed by clinician on day of visit: allergies, medications, problem list, medical history, surgical  history, family history, social history, and previous encounter notes.  I have reviewed the above documentation for accuracy and completeness, and I agree with the above. -  Erin Flores d. Erin Mesmer, NP-C

## 2023-02-17 LAB — VITAMIN D 25 HYDROXY (VIT D DEFICIENCY, FRACTURES): Vit D, 25-Hydroxy: 53.4 ng/mL (ref 30.0–100.0)

## 2023-02-17 LAB — INSULIN, RANDOM: INSULIN: 29.9 u[IU]/mL — ABNORMAL HIGH (ref 2.6–24.9)

## 2023-02-17 LAB — HEMOGLOBIN A1C
Est. average glucose Bld gHb Est-mCnc: 111 mg/dL
Hgb A1c MFr Bld: 5.5 % (ref 4.8–5.6)

## 2023-02-18 ENCOUNTER — Telehealth (INDEPENDENT_AMBULATORY_CARE_PROVIDER_SITE_OTHER): Payer: Self-pay

## 2023-02-18 NOTE — Telephone Encounter (Signed)
I got an immediate reply stating a clinical override was not needed. So no PA was needed. Pt notified.3:04 02/18/23 KP

## 2023-02-18 NOTE — Telephone Encounter (Signed)
PA submitted for Zepbound, waiting on a determination. 3:01 02/18/23 KP

## 2023-02-25 ENCOUNTER — Telehealth (INDEPENDENT_AMBULATORY_CARE_PROVIDER_SITE_OTHER): Payer: Self-pay | Admitting: Adult Health

## 2023-02-25 NOTE — Telephone Encounter (Signed)
Patient called in stating her Insurance sent a form to be completed before they will cover her Zepbound due to having reached her maximum dosage. Said they could not refill the 7.5 RX.  Patient states she has not had her Zepbound in 2 weeks and has gained 5 pounds.    Thank you

## 2023-03-02 ENCOUNTER — Other Ambulatory Visit (INDEPENDENT_AMBULATORY_CARE_PROVIDER_SITE_OTHER): Payer: Self-pay | Admitting: Adult Health

## 2023-03-02 ENCOUNTER — Encounter (INDEPENDENT_AMBULATORY_CARE_PROVIDER_SITE_OTHER): Payer: Self-pay | Admitting: Adult Health

## 2023-03-02 MED ORDER — ZEPBOUND 10 MG/0.5ML ~~LOC~~ SOAJ: 10.0000 mg | SUBCUTANEOUS | 0 refills | Status: DC

## 2023-03-05 DIAGNOSIS — F319 Bipolar disorder, unspecified: Secondary | ICD-10-CM | POA: Diagnosis not present

## 2023-03-11 ENCOUNTER — Other Ambulatory Visit (INDEPENDENT_AMBULATORY_CARE_PROVIDER_SITE_OTHER): Payer: Self-pay | Admitting: Adult Health

## 2023-03-11 DIAGNOSIS — K219 Gastro-esophageal reflux disease without esophagitis: Secondary | ICD-10-CM

## 2023-03-18 ENCOUNTER — Ambulatory Visit: Payer: BC Managed Care – PPO | Admitting: Orthopedic Surgery

## 2023-03-23 ENCOUNTER — Encounter (INDEPENDENT_AMBULATORY_CARE_PROVIDER_SITE_OTHER): Payer: Self-pay | Admitting: Adult Health

## 2023-03-23 ENCOUNTER — Ambulatory Visit (INDEPENDENT_AMBULATORY_CARE_PROVIDER_SITE_OTHER): Payer: BC Managed Care – PPO | Admitting: Adult Health

## 2023-03-23 VITALS — BP 116/76 | HR 100 | Temp 98.1°F | Ht 63.0 in | Wt 206.0 lb

## 2023-03-23 DIAGNOSIS — R7303 Prediabetes: Secondary | ICD-10-CM

## 2023-03-23 DIAGNOSIS — E669 Obesity, unspecified: Secondary | ICD-10-CM | POA: Diagnosis not present

## 2023-03-23 DIAGNOSIS — E66812 Obesity, class 2: Secondary | ICD-10-CM

## 2023-03-23 DIAGNOSIS — R110 Nausea: Secondary | ICD-10-CM

## 2023-03-23 DIAGNOSIS — E559 Vitamin D deficiency, unspecified: Secondary | ICD-10-CM

## 2023-03-23 DIAGNOSIS — Z6836 Body mass index (BMI) 36.0-36.9, adult: Secondary | ICD-10-CM

## 2023-03-23 DIAGNOSIS — E88819 Insulin resistance, unspecified: Secondary | ICD-10-CM

## 2023-03-23 MED ORDER — ZEPBOUND 10 MG/0.5ML ~~LOC~~ SOAJ: 10.0000 mg | SUBCUTANEOUS | 0 refills | Status: DC

## 2023-03-23 MED ORDER — METFORMIN HCL 500 MG PO TABS: ORAL_TABLET | ORAL | 0 refills | Status: DC

## 2023-03-23 NOTE — Progress Notes (Addendum)
WEIGHT SUMMARY AND BIOMETRICS  Vitals Temp: 98.1 F (36.7 C) BP: 116/76 Pulse Rate: 100 SpO2: 97 %   Anthropometric Measurements Height: 5\' 3"  (1.6 m) Weight: 206 lb (93.4 kg) BMI (Calculated): 36.5 Weight at Last Visit: 212lb Weight Lost Since Last Visit: 6lb Weight Gained Since Last Visit: 0 Starting Weight: 201lb Total Weight Loss (lbs): 0 lb (0 kg)   Body Composition  Body Fat %: 44.3 % Fat Mass (lbs): 91.2 lbs Muscle Mass (lbs): 109 lbs Total Body Water (lbs): 77.4 lbs Visceral Fat Rating : 11   Other Clinical Data Fasting: no Labs: no Today's Visit #: 20 Starting Date: 01/23/21    Chief Complaint:   OBESITY Erin Flores is here to discuss her progress with her obesity treatment plan. She is on the the Category 2 Plan and states she is following her eating plan approximately 90 % of the time. She states she is exercising daily walking- 30-60 minutes 4-5 times per week.   Interim History:  Her insurance would not refill Zepbound 7.5mg - for further coverage required increasing to 10mg  strength. She has had profound nausea without vomiting with increased dosage. She declined pausing GIP/GLP-1 therapy for SEs to subside. 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  Exercise-slowly advancing activity  Hydration-she estimate to drink 3 "Stanley" water bottles/day  Subjective:   1. Prediabetes Discussed Labs  Latest Reference Range & Units 02/16/23 09:17  Hemoglobin A1C 4.8 - 5.6 % 5.5  Est. average glucose Bld gHb Est-mCnc mg/dL 914  INSULIN 2.6 - 78.2 uIU/mL 29.9 (H)  (H): Data is abnormally high  Insulin Resistance worsened, with improved A1c  She is currently on weekly Zepbound 10mg  Denies mass in  neck, dysphagia, dyspepsia, persistent hoarseness, abdominal pain, vomiting or constipation. Nausea without vomiting managed with reducing food intake and PRN Zofran  2. Nausea Discussed Labs She has had profound nausea without vomiting with increased dosage. She declined pausing GIP/GLP-1 therapy for SEs to subside. 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 Vitamin D deficiency Discussed Labs  Latest Reference Range & Units 02/16/23 09:17  Vitamin D, 25-Hydroxy 30.0 - 100.0 ng/mL 53.4   Level at goal She denies any Vit D Supplementation  4. Insulin resistance Discussed Labs  Latest Reference Range & Units 02/16/23 09:17  Hemoglobin A1C 4.8 - 5.6 % 5.5  Est. average glucose Bld gHb Est-mCnc mg/dL 956  INSULIN 2.6 - 21.3 uIU/mL 29.9 (H)  (H): Data is abnormally high   Latest Reference Range & Units 08/21/22 11:54 02/16/23 09:17  INSULIN 2.6 - 24.9 uIU/mL 15.2 29.9 (H)  (H): Data is abnormally high  Insulin Resistance worsened, with improved A1c She is currently on weekly Zepbound 10mg  Denies mass in neck, dysphagia, dyspepsia, persistent hoarseness, abdominal pain, vomiting or constipation. Nausea without vomiting managed with reducing food intake and PRN Zofran  Assessment/Plan:   1. Prediabetes Increase protein and limit sugar/simple CHO Continue to advance activity as tolerated.  2. Nausea Do not overeat Limit simple CHO/sugar intake  3. Vitamin D deficiency Monitor Labs  4. Insulin resistance Increase protein and limit sugar/simple CHO Continue to advance  activity as tolerated.  5. Obesity with current BMI of 36.5 Refill   tirzepatide (ZEPBOUND) 10 MG/0.5ML Pen Inject 10 mg into the skin once a week. Dispense: 3 mL, Refills: 0 ordered   Erin Flores is currently in the action stage of change. As such, her goal is to  continue with weight loss efforts. She has agreed to the Category 2 Plan.   Exercise goals: For substantial health benefits, adults should do at least 150 minutes (2 hours and 30 minutes) a week of moderate-intensity, or 75 minutes (1 hour and 15 minutes) a week of vigorous-intensity aerobic physical activity, or an equivalent combination of moderate- and vigorous-intensity aerobic activity. Aerobic activity should be performed in episodes of at least 10 minutes, and preferably, it should be spread throughout the week.  Behavioral modification strategies: increasing lean protein intake, decreasing simple carbohydrates, increasing vegetables, increasing water intake, no skipping meals, meal planning and cooking strategies, keeping healthy foods in the home, and planning for success.  Erin Flores has agreed to follow-up with our clinic in 4 weeks. She was informed of the importance of frequent follow-up visits to maximize her success with intensive lifestyle modifications for her multiple health conditions.   Objective:   Blood pressure 116/76, pulse 100, temperature 98.1 F (36.7 C), height 5\' 3"  (1.6 m), weight 206 lb (93.4 kg), SpO2 97%. Body mass index is 36.49 kg/m.  General: Cooperative, alert, well developed, in no acute distress. HEENT: Conjunctivae and lids unremarkable. Cardiovascular: Regular rhythm.  Lungs: Normal work of breathing. Neurologic: No focal deficits.   Lab Results  Component Value Date   CREATININE 0.9 02/06/2023   BUN 12 02/06/2023   NA 138 02/06/2023   K 4.5 02/06/2023   CL 99 02/06/2023   CO2 23 (A) 02/06/2023   Lab Results  Component Value Date   ALT 81 (A) 02/06/2023   AST 44 (A) 02/06/2023   ALKPHOS 116 02/06/2023   BILITOT 0.5 08/21/2022   Lab Results  Component Value Date   HGBA1C 5.5 02/16/2023   HGBA1C 5.8 (H) 08/21/2022   HGBA1C 5.0 10/18/2021   HGBA1C 5.3 05/14/2021   HGBA1C 5.5 01/23/2021   Lab Results  Component Value Date   INSULIN 29.9  (H) 02/16/2023   INSULIN 15.2 08/21/2022   INSULIN 6.8 05/14/2021   INSULIN 12.5 01/23/2021   Lab Results  Component Value Date   TSH 1.87 10/18/2021   Lab Results  Component Value Date   CHOL 264 (H) 05/14/2021   HDL 48 05/14/2021   LDLCALC 162 (H) 05/14/2021   TRIG 288 (H) 05/14/2021   CHOLHDL 5.5 (H) 05/14/2021   Lab Results  Component Value Date   VD25OH 53.4 02/16/2023   VD25OH 60.1 08/21/2022   VD25OH 51.0 01/23/2021   Lab Results  Component Value Date   WBC 9.5 02/06/2023   HGB 14.2 02/06/2023   HCT 44 02/06/2023   MCV 85.5 10/06/2022   PLT 371 10/06/2022   Lab Results  Component Value Date   IRON 94 05/14/2021   TIBC 539 (H) 05/14/2021   FERRITIN 21 05/14/2021    Attestation Statements:   Reviewed by clinician on day of visit: allergies, medications, problem list, medical history, surgical history, family history, social history, and previous encounter notes.  I have reviewed the above documentation for accuracy and completeness, and I agree with the above. -  Alsie Younes d. Andreina Outten, NP-C

## 2023-04-06 NOTE — Telephone Encounter (Signed)
Closing previous encounter  Denyce Robert, NP

## 2023-04-20 ENCOUNTER — Encounter (INDEPENDENT_AMBULATORY_CARE_PROVIDER_SITE_OTHER): Payer: Self-pay | Admitting: Adult Health

## 2023-04-20 ENCOUNTER — Ambulatory Visit (INDEPENDENT_AMBULATORY_CARE_PROVIDER_SITE_OTHER): Payer: BC Managed Care – PPO | Admitting: Adult Health

## 2023-04-20 VITALS — BP 115/78 | HR 92 | Temp 97.8°F | Ht 63.0 in | Wt 195.0 lb

## 2023-04-20 DIAGNOSIS — E669 Obesity, unspecified: Secondary | ICD-10-CM | POA: Diagnosis not present

## 2023-04-20 DIAGNOSIS — Z6834 Body mass index (BMI) 34.0-34.9, adult: Secondary | ICD-10-CM | POA: Diagnosis not present

## 2023-04-20 DIAGNOSIS — E88819 Insulin resistance, unspecified: Secondary | ICD-10-CM

## 2023-04-20 MED ORDER — ZEPBOUND 10 MG/0.5ML ~~LOC~~ SOAJ: 10.0000 mg | SUBCUTANEOUS | 0 refills | Status: DC

## 2023-04-20 NOTE — Progress Notes (Signed)
WEIGHT SUMMARY AND BIOMETRICS  Vitals Temp: 97.8 F (36.6 C) BP: 115/78 Pulse Rate: 92 SpO2: 97 %   Anthropometric Measurements Height: 5\' 3"  (1.6 m) Weight: 195 lb (88.5 kg) BMI (Calculated): 34.55 Weight at Last Visit: 206lb Weight Lost Since Last Visit: 11lb Weight Gained Since Last Visit: 0 Starting Weight: 201lb Total Weight Loss (lbs): 6 lb (2.722 kg)   Body Composition  Body Fat %: 44.3 % Fat Mass (lbs): 86.6 lbs Muscle Mass (lbs): 103.2 lbs Total Body Water (lbs): 75.4 lbs Visceral Fat Rating : 10   Other Clinical Data Fasting: no Labs: no Today's Visit #: 21 Starting Date: 01/23/21    Chief Complaint:   OBESITY Erin Flores is here to discuss her progress with her obesity treatment plan. She is on the the Category 3 Plan and states she is following her eating plan approximately 70 % of the time. She states she is exercising Gym 30-60 minutes 3 times per week.   Interim History:   Reviewed Bioimpedance results with pt: Muscle Mass: -5.8 lbs Adipose Mass: -4.6 lbs   Subjective:   1. Insulin resistance  Latest Reference Range & Units 02/16/23 09:17  Hemoglobin A1C 4.8 - 5.6 % 5.5  Est. average glucose Bld gHb Est-mCnc mg/dL 784  INSULIN 2.6 - 69.6 uIU/mL 29.9 (H)  (H): Data is abnormally high  She was started on Zepbound therapy 10/06/2022 She has titrated up to 10mg  once weekly injection Denies mass in neck, dysphagia, dyspepsia, persistent hoarseness, abdominal pain, or N/V/C  After her first two injections of 10mg - she experienced nausea without vomiting- this has resolved.  Assessment/Plan:   1. Insulin resistance Continue Cat 2 Meal Plan, regular exercise, and weekly Zepbound  2. Obesity with current BMI of 34.52 Refill tirzepatide (ZEPBOUND) 10 MG/0.5ML Pen Inject 10 mg into the skin once a week. Dispense: 3 mL, Refills: 0 ordered   Erin Flores is currently in the action stage of change. As such, her goal is to continue with weight  loss efforts. She has agreed to the Category 3 Plan.   Exercise goals: For substantial health benefits, adults should do at least 150 minutes (2 hours and 30 minutes) a week of moderate-intensity, or 75 minutes (1 hour and 15 minutes) a week of vigorous-intensity aerobic physical activity, or an equivalent combination of moderate- and vigorous-intensity aerobic activity. Aerobic activity should be performed in episodes of at least 10 minutes, and preferably, it should be spread throughout the week.  Behavioral modification strategies: increasing lean protein intake, decreasing simple carbohydrates, increasing vegetables, increasing water intake, no skipping meals, meal planning and cooking strategies, keeping healthy foods in the home, and planning for success.  Erin Flores has agreed to follow-up with our clinic in 4 weeks. She was informed of the importance of frequent follow-up visits to maximize her success with intensive lifestyle modifications for her multiple health conditions.   Objective:   Blood pressure 115/78, pulse 92, temperature 97.8 F (36.6 C), height 5\' 3"  (1.6 m), weight 195 lb (88.5 kg), SpO2 97%. Body mass index is 34.54 kg/m.  General: Cooperative, alert, well developed, in no acute distress. HEENT: Conjunctivae and lids unremarkable. Cardiovascular: Regular rhythm.  Lungs: Normal work of breathing. Neurologic: No focal deficits.   Lab Results  Component Value Date   CREATININE 0.9 02/06/2023   BUN 12 02/06/2023   NA 138 02/06/2023   K 4.5 02/06/2023   CL 99 02/06/2023   CO2 23 (A) 02/06/2023   Lab Results  Component Value Date   ALT 81 (A) 02/06/2023   AST 44 (A) 02/06/2023   ALKPHOS 116 02/06/2023   BILITOT 0.5 08/21/2022   Lab Results  Component Value Date   HGBA1C 5.5 02/16/2023   HGBA1C 5.8 (H) 08/21/2022   HGBA1C 5.0 10/18/2021   HGBA1C 5.3 05/14/2021   HGBA1C 5.5 01/23/2021   Lab Results  Component Value Date   INSULIN 29.9 (H) 02/16/2023    INSULIN 15.2 08/21/2022   INSULIN 6.8 05/14/2021   INSULIN 12.5 01/23/2021   Lab Results  Component Value Date   TSH 1.87 10/18/2021   Lab Results  Component Value Date   CHOL 264 (H) 05/14/2021   HDL 48 05/14/2021   LDLCALC 162 (H) 05/14/2021   TRIG 288 (H) 05/14/2021   CHOLHDL 5.5 (H) 05/14/2021   Lab Results  Component Value Date   VD25OH 53.4 02/16/2023   VD25OH 60.1 08/21/2022   VD25OH 51.0 01/23/2021   Lab Results  Component Value Date   WBC 9.5 02/06/2023   HGB 14.2 02/06/2023   HCT 44 02/06/2023   MCV 85.5 10/06/2022   PLT 371 10/06/2022   Lab Results  Component Value Date   IRON 94 05/14/2021   TIBC 539 (H) 05/14/2021   FERRITIN 21 05/14/2021    Attestation Statements:   Reviewed by clinician on day of visit: allergies, medications, problem list, medical history, surgical history, family history, social history, and previous encounter notes.  I have reviewed the above documentation for accuracy and completeness, and I agree with the above. -  Sharifa Bucholz d. Javontae Marlette, NP-C

## 2023-04-29 ENCOUNTER — Other Ambulatory Visit (INDEPENDENT_AMBULATORY_CARE_PROVIDER_SITE_OTHER): Payer: Self-pay | Admitting: Adult Health

## 2023-04-29 DIAGNOSIS — K219 Gastro-esophageal reflux disease without esophagitis: Secondary | ICD-10-CM

## 2023-04-29 MED ORDER — OMEPRAZOLE 20 MG PO CPDR
20.0000 mg | DELAYED_RELEASE_CAPSULE | Freq: Every day | ORAL | 0 refills | Status: DC
Start: 2023-04-29 — End: 2023-07-22

## 2023-04-29 NOTE — Telephone Encounter (Signed)
LAST APPOINTMENT DATE: 04/10/2023 NEXT APPOINTMENT DATE: 05/20/2023   [facility] - [place], [place] - [address] [place] [address] Phone: [phone] Fax: [phone]  [facility] - [place], [place] - [address] [place] [address] Phone: [phone] Fax: [phone]  [facility] [phone] - [place], [place] - [address] [place] [address] Phone: [phone] Fax: [phone]  [facility] - [facility] [address] [place] [address] Phone: [phone] Fax: [phone]  Patient is requesting a refill of the following medications: Requested Prescriptions   Pending Prescriptions Disp Refills   omeprazole (PRILOSEC) 20 MG capsule 90 capsule 0    Sig: Take 1 capsule (20 mg total) by mouth daily.    Date last filled: 12/11/2022 Previously prescribed by William Hamburger  Lab Results  Component Value Date   HGBA1C 5.5 02/16/2023   HGBA1C 5.8 (H) 08/21/2022   HGBA1C 5.0 10/18/2021   Lab Results  Component Value Date   LDLCALC 162 (H) 05/14/2021   CREATININE 0.9 02/06/2023   Lab Results  Component Value Date   VD25OH 53.4 02/16/2023   VD25OH 60.1 08/21/2022   VD25OH 51.0 01/23/2021    BP Readings from Last 3 Encounters:  04/20/23 115/78  03/23/23 116/76  02/16/23 108/76

## 2023-05-13 ENCOUNTER — Other Ambulatory Visit: Payer: Self-pay

## 2023-05-20 ENCOUNTER — Encounter (INDEPENDENT_AMBULATORY_CARE_PROVIDER_SITE_OTHER): Payer: Self-pay | Admitting: Adult Health

## 2023-05-20 ENCOUNTER — Ambulatory Visit (INDEPENDENT_AMBULATORY_CARE_PROVIDER_SITE_OTHER): Payer: BC Managed Care – PPO | Admitting: Adult Health

## 2023-05-20 VITALS — BP 126/87 | HR 85 | Temp 98.2°F | Ht 63.0 in | Wt 190.0 lb

## 2023-05-20 DIAGNOSIS — K219 Gastro-esophageal reflux disease without esophagitis: Secondary | ICD-10-CM | POA: Diagnosis not present

## 2023-05-20 DIAGNOSIS — K760 Fatty (change of) liver, not elsewhere classified: Secondary | ICD-10-CM

## 2023-05-20 DIAGNOSIS — Z6833 Body mass index (BMI) 33.0-33.9, adult: Secondary | ICD-10-CM

## 2023-05-20 DIAGNOSIS — E669 Obesity, unspecified: Secondary | ICD-10-CM

## 2023-05-20 DIAGNOSIS — E66812 Obesity, class 2: Secondary | ICD-10-CM

## 2023-05-20 DIAGNOSIS — E88819 Insulin resistance, unspecified: Secondary | ICD-10-CM

## 2023-05-20 MED ORDER — ZEPBOUND 12.5 MG/0.5ML ~~LOC~~ SOAJ: 12.5000 mg | SUBCUTANEOUS | 0 refills | Status: DC

## 2023-05-20 NOTE — Progress Notes (Signed)
WEIGHT SUMMARY AND BIOMETRICS  Vitals Temp: 98.2 F (36.8 C) BP: 126/87 Pulse Rate: 85 SpO2: 98 %   Anthropometric Measurements Height: 5\' 3"  (1.6 m) Weight: 190 lb (86.2 kg) BMI (Calculated): 33.67 Weight at Last Visit: 195lb Weight Lost Since Last Visit: 5lb Weight Gained Since Last Visit: 0 Starting Weight: 201lb Total Weight Loss (lbs): 11 lb (4.99 kg)   Body Composition  Body Fat %: 43.2 % Fat Mass (lbs): 82 lbs Muscle Mass (lbs): 102.6 lbs Total Body Water (lbs): 75.2 lbs Visceral Fat Rating : 9   Other Clinical Data Fasting: no Labs: no Today's Visit #: 22 Starting Date: 01/23/21    Chief Complaint:   OBESITY Erin Flores is here to discuss her progress with her obesity treatment plan. She is on the the Category 2 Plan and states she is following her eating plan approximately 50 % of the time.  She states she is exercising NEAT Activities and walking at work.   Interim History:  She is on weekly Zepbound 10mg  Denies mass in neck, dysphagia, dyspepsia, persistent hoarseness, abdominal pain, or N/V/C  She endorses recent increase in polyphagia and subsequent snacking.  She reports that her 52 year old daughter recently shared that she is pregnant, currently [redacted] weeks gestation. Erin Flores reports increased stress with upcoming birth of her first grandchild  Subjective:   1. Gastroesophageal reflux disease, unspecified whether esophagitis present She reports GERD sx's > 13 years. She reports sx's are well managed on daily Prilosec 20mg  She avoids known trigger foods and does not eat too close to bedtime.  2. Insulin resistance She is on weekly Zepbound 10mg  Denies mass in neck, dysphagia, dyspepsia, persistent hoarseness, abdominal pain, or N/V/C  She endorses recent increase in polyphagia and subsequent snacking.  3. NAFLD (nonalcoholic fatty liver disease) She denies RUQ pain She is on weekly Zepbound 10mg  Denies mass in neck, dysphagia,  dyspepsia, persistent hoarseness, abdominal pain, or N/V/C   Assessment/Plan:   1. Gastroesophageal reflux disease, unspecified whether esophagitis present Continue to avoid known trigger foods Continue daily Prilosec  2. Insulin resistance (Primary) Increase protein Increase regular actitivity  3. NAFLD (nonalcoholic fatty liver disease) Continue with weight loss efforts Avoid Hepatotoxic substances  4. Obesity, Starting BMI 33.67 Refill and INCREASE   tirzepatide (ZEPBOUND) 12.5 MG/0.5ML Pen Inject 12.5 mg into the skin once a week. Dispense: 2 mL, Refills: 0 ordered   Erin Flores is currently in the action stage of change. As such, her goal is to continue with weight loss efforts. She has agreed to the Category 2 Plan.   Exercise goals: All adults should avoid inactivity. Some physical activity is better than none, and adults who participate in any amount of physical activity gain some health benefits. Adults should also include muscle-strengthening activities that involve all major muscle groups on 2 or more days a week.  Behavioral modification strategies: increasing lean protein intake, decreasing simple carbohydrates, increasing vegetables, increasing water intake, meal planning and cooking strategies, keeping healthy foods in the home, ways to avoid boredom eating, holiday eating strategies , and planning for success.  Erin Flores has agreed to follow-up with our clinic in 4 weeks. She was informed of the importance of frequent follow-up visits to maximize her success with intensive lifestyle modifications for her multiple health conditions.   Objective:   Blood pressure 126/87, pulse 85, temperature 98.2 F (36.8 C), height 5\' 3"  (1.6 m), weight 190 lb (86.2 kg), SpO2 98%. Body mass index is 33.66  kg/m.  General: Cooperative, alert, well developed, in no acute distress. HEENT: Conjunctivae and lids unremarkable. Cardiovascular: Regular rhythm.  Lungs: Normal work of  breathing. Neurologic: No focal deficits.   Lab Results  Component Value Date   CREATININE 0.9 02/06/2023   BUN 12 02/06/2023   NA 138 02/06/2023   K 4.5 02/06/2023   CL 99 02/06/2023   CO2 23 (A) 02/06/2023   Lab Results  Component Value Date   ALT 81 (A) 02/06/2023   AST 44 (A) 02/06/2023   ALKPHOS 116 02/06/2023   BILITOT 0.5 08/21/2022   Lab Results  Component Value Date   HGBA1C 5.5 02/16/2023   HGBA1C 5.8 (H) 08/21/2022   HGBA1C 5.0 10/18/2021   HGBA1C 5.3 05/14/2021   HGBA1C 5.5 01/23/2021   Lab Results  Component Value Date   INSULIN 29.9 (H) 02/16/2023   INSULIN 15.2 08/21/2022   INSULIN 6.8 05/14/2021   INSULIN 12.5 01/23/2021   Lab Results  Component Value Date   TSH 1.87 10/18/2021   Lab Results  Component Value Date   CHOL 264 (H) 05/14/2021   HDL 48 05/14/2021   LDLCALC 162 (H) 05/14/2021   TRIG 288 (H) 05/14/2021   CHOLHDL 5.5 (H) 05/14/2021   Lab Results  Component Value Date   VD25OH 53.4 02/16/2023   VD25OH 60.1 08/21/2022   VD25OH 51.0 01/23/2021   Lab Results  Component Value Date   WBC 9.5 02/06/2023   HGB 14.2 02/06/2023   HCT 44 02/06/2023   MCV 85.5 10/06/2022   PLT 371 10/06/2022   Lab Results  Component Value Date   IRON 94 05/14/2021   TIBC 539 (H) 05/14/2021   FERRITIN 21 05/14/2021   Attestation Statements:   Reviewed by clinician on day of visit: allergies, medications, problem list, medical history, surgical history, family history, social history, and previous encounter notes.  I have reviewed the above documentation for accuracy and completeness, and I agree with the above. -  Celest Reitz d. Kinzee Happel, NP-C

## 2023-06-04 DIAGNOSIS — F319 Bipolar disorder, unspecified: Secondary | ICD-10-CM | POA: Diagnosis not present

## 2023-06-17 DIAGNOSIS — M5442 Lumbago with sciatica, left side: Secondary | ICD-10-CM | POA: Diagnosis not present

## 2023-06-17 DIAGNOSIS — L400 Psoriasis vulgaris: Secondary | ICD-10-CM | POA: Diagnosis not present

## 2023-06-17 DIAGNOSIS — L4059 Other psoriatic arthropathy: Secondary | ICD-10-CM | POA: Diagnosis not present

## 2023-06-17 DIAGNOSIS — Z79899 Other long term (current) drug therapy: Secondary | ICD-10-CM | POA: Diagnosis not present

## 2023-06-23 ENCOUNTER — Telehealth (INDEPENDENT_AMBULATORY_CARE_PROVIDER_SITE_OTHER): Payer: Self-pay

## 2023-06-23 ENCOUNTER — Encounter (INDEPENDENT_AMBULATORY_CARE_PROVIDER_SITE_OTHER): Payer: Self-pay | Admitting: Adult Health

## 2023-06-23 ENCOUNTER — Other Ambulatory Visit (INDEPENDENT_AMBULATORY_CARE_PROVIDER_SITE_OTHER): Payer: Self-pay | Admitting: Adult Health

## 2023-06-23 MED ORDER — ZEPBOUND 12.5 MG/0.5ML ~~LOC~~ SOAJ: 12.5000 mg | SUBCUTANEOUS | 0 refills | Status: DC

## 2023-06-23 NOTE — Telephone Encounter (Signed)
PA questions for Zepbound 12.5  have been answered and all documentation has been included. Waiting on a determination.

## 2023-06-23 NOTE — Telephone Encounter (Signed)
PA for Zepbound 12.5 has been submitted, awaiting PA questions.

## 2023-06-24 ENCOUNTER — Encounter (INDEPENDENT_AMBULATORY_CARE_PROVIDER_SITE_OTHER): Payer: Self-pay | Admitting: Adult Health

## 2023-06-24 ENCOUNTER — Ambulatory Visit (INDEPENDENT_AMBULATORY_CARE_PROVIDER_SITE_OTHER): Payer: BC Managed Care – PPO | Admitting: Adult Health

## 2023-06-24 VITALS — BP 121/84 | HR 89 | Temp 97.9°F | Ht 63.0 in | Wt 179.0 lb

## 2023-06-24 DIAGNOSIS — E88819 Insulin resistance, unspecified: Secondary | ICD-10-CM

## 2023-06-24 DIAGNOSIS — Z6831 Body mass index (BMI) 31.0-31.9, adult: Secondary | ICD-10-CM

## 2023-06-24 DIAGNOSIS — K760 Fatty (change of) liver, not elsewhere classified: Secondary | ICD-10-CM

## 2023-06-24 DIAGNOSIS — R21 Rash and other nonspecific skin eruption: Secondary | ICD-10-CM | POA: Diagnosis not present

## 2023-06-24 DIAGNOSIS — E669 Obesity, unspecified: Secondary | ICD-10-CM

## 2023-06-24 DIAGNOSIS — I10 Essential (primary) hypertension: Secondary | ICD-10-CM | POA: Diagnosis not present

## 2023-06-24 NOTE — Progress Notes (Signed)
WEIGHT SUMMARY AND BIOMETRICS  Vitals Temp: 97.9 F (36.6 C) BP: 121/84 Pulse Rate: 89 SpO2: 98 %   Anthropometric Measurements Height: 5\' 3"  (1.6 m) Weight: 179 lb (81.2 kg) BMI (Calculated): 31.72 Weight at Last Visit: 190lb Weight Lost Since Last Visit: 11lb Weight Gained Since Last Visit: 0 Starting Weight: 201lb Total Weight Loss (lbs): 22 lb (9.979 kg)   Body Composition  Body Fat %: 42.1 % Fat Mass (lbs): 75.4 lbs Muscle Mass (lbs): 98.4 lbs Total Body Water (lbs): 71.4 lbs Visceral Fat Rating : 9   Other Clinical Data Fasting: no Labs: no Today's Visit #: 23 Starting Date: 01/23/21    Chief Complaint:   OBESITY Erin is here to discuss her progress with her obesity treatment plan. She is on the the Category 2 Plan and states she is following her eating plan approximately 85-90 % of the time.  She states she is exercising: NEAT Activities   Interim History:  Erin Flores has been unable to exercise due to chronic hip and knee pain.  She was started on Zepbound therapy 10/06/2022  05/20/2023 Zepbound increased from 10mg  to 12.5mg  Denies mass in neck, dysphagia, dyspepsia, persistent hoarseness, abdominal pain, or N/V/C  She reports excellent appetite control and change in taste for foods/drinks. She has stopped consuming all ETOH the last several months.  Subjective:   1. NAFLD (nonalcoholic fatty liver disease) She recently had chronic f/u with labs at Rheumatologist. Per pt- Liver enzymes had GREATLY reduced She reports that when she increased Zepbound from 7.5mg  to 10mg  weekly injection- DRAMATIC reduction in ETOH use. Previously consuming a bottle wine/night to complete cessation of  ETOH use the last 8 weeks. She also denies current RUQ pain  2. Essential hypertension She reports intermittent dizziness with position changes, specifically from sitting to standing. She also endorses increase in fatigue  Sx's started approximately 3-4  weeks ago. She held daily Avapro 150mg  for 3 days, resumed last night. BP today 121/84 She has not been checking home BP readings- agreeable to start recoding home numbers  3. Insulin resistance  Latest Reference Range & Units 08/21/22 11:54 02/16/23 09:17  INSULIN 2.6 - 24.9 uIU/mL 15.2 29.9 (H)  (H): Data is abnormally high  4. Rash of Skin Erin Flores reports recent development of lower abdominal rash. Site is painful- described as stinging pain. She denies drainage from site. She has been keeping site clean and drying area profusely. She has not applied any topical treatments. She report this has started since recent weight loss and her abdominal shape changing.  Assessment/Plan:   1. NAFLD (nonalcoholic fatty liver disease) Continue with weight loss efforts Monitor labs Continue to avoid Hepatotoxic substances  2. Essential hypertension Check BP daily and if sx's of hypotension develop Take Irbesartan 150mg  every other day  3. Insulin resistance Continue healthy eating, remain as active as tolerated. Continue GIP/GLP-1 therapy as directed.  4. Rash of Skin Keep site clean and VERY dry Monitor sx's Consider referral to Plastics if it does not improve, re: panniculectomy   5. Obesity with current BMI of 31.72 Refill Zebbound 12.5mg  when PA   Erin Flores is currently in the action stage of change. As such, her goal is to continue with weight loss efforts. She has agreed to the Category 2 Plan.   Exercise goals: No exercise has been prescribed at this time.  Behavioral modification strategies: increasing lean protein intake, decreasing simple carbohydrates, increasing vegetables, increasing water intake, no skipping meals,  meal planning and cooking strategies, keeping healthy foods in the home, ways to avoid boredom eating, and planning for success.  Erin has agreed to follow-up with our clinic in 4 weeks. She was informed of the importance of frequent follow-up visits  to maximize her success with intensive lifestyle modifications for her multiple health conditions.   Check Fasting Labs at next OV  Objective:   Blood pressure 121/84, pulse 89, temperature 97.9 F (36.6 C), height 5\' 3"  (1.6 m), weight 179 lb (81.2 kg), SpO2 98%. Body mass index is 31.71 kg/m.  General: Cooperative, alert, well developed, in no acute distress. HEENT: Conjunctivae and lids unremarkable. Cardiovascular: Regular rhythm.  Lungs: Normal work of breathing. Neurologic: No focal deficits.   Lab Results  Component Value Date   CREATININE 0.9 02/06/2023   BUN 12 02/06/2023   NA 138 02/06/2023   K 4.5 02/06/2023   CL 99 02/06/2023   CO2 23 (A) 02/06/2023   Lab Results  Component Value Date   ALT 81 (A) 02/06/2023   AST 44 (A) 02/06/2023   ALKPHOS 116 02/06/2023   BILITOT 0.5 08/21/2022   Lab Results  Component Value Date   HGBA1C 5.5 02/16/2023   HGBA1C 5.8 (H) 08/21/2022   HGBA1C 5.0 10/18/2021   HGBA1C 5.3 05/14/2021   HGBA1C 5.5 01/23/2021   Lab Results  Component Value Date   INSULIN 29.9 (H) 02/16/2023   INSULIN 15.2 08/21/2022   INSULIN 6.8 05/14/2021   INSULIN 12.5 01/23/2021   Lab Results  Component Value Date   TSH 1.87 10/18/2021   Lab Results  Component Value Date   CHOL 264 (H) 05/14/2021   HDL 48 05/14/2021   LDLCALC 162 (H) 05/14/2021   TRIG 288 (H) 05/14/2021   CHOLHDL 5.5 (H) 05/14/2021   Lab Results  Component Value Date   VD25OH 53.4 02/16/2023   VD25OH 60.1 08/21/2022   VD25OH 51.0 01/23/2021   Lab Results  Component Value Date   WBC 9.5 02/06/2023   HGB 14.2 02/06/2023   HCT 44 02/06/2023   MCV 85.5 10/06/2022   PLT 371 10/06/2022   Lab Results  Component Value Date   IRON 94 05/14/2021   TIBC 539 (H) 05/14/2021   FERRITIN 21 05/14/2021   Attestation Statements:   Reviewed by clinician on day of visit: allergies, medications, problem list, medical history, surgical history, family history, social history, and  previous encounter notes.  Time spent on visit including pre-visit chart review and post-visit care and charting was 29 minutes.   I have reviewed the above documentation for accuracy and completeness, and I agree with the above. -  Symantha Steeber d. Zenon Leaf, NP-C

## 2023-06-29 ENCOUNTER — Telehealth (INDEPENDENT_AMBULATORY_CARE_PROVIDER_SITE_OTHER): Payer: Self-pay | Admitting: Adult Health

## 2023-06-29 ENCOUNTER — Other Ambulatory Visit (INDEPENDENT_AMBULATORY_CARE_PROVIDER_SITE_OTHER): Payer: Self-pay | Admitting: Adult Health

## 2023-06-29 ENCOUNTER — Encounter (INDEPENDENT_AMBULATORY_CARE_PROVIDER_SITE_OTHER): Payer: Self-pay | Admitting: Adult Health

## 2023-06-29 MED ORDER — ZEPBOUND 15 MG/0.5ML ~~LOC~~ SOAJ: 15.0000 mg | SUBCUTANEOUS | 0 refills | Status: DC

## 2023-06-29 NOTE — Telephone Encounter (Signed)
Pt says medscript denied Zetbound, needs refill before appt

## 2023-06-30 ENCOUNTER — Telehealth (INDEPENDENT_AMBULATORY_CARE_PROVIDER_SITE_OTHER): Payer: Self-pay

## 2023-06-30 NOTE — Telephone Encounter (Signed)
PA for Zepbound 15MG  has been approved. PA is now complete.

## 2023-06-30 NOTE — Telephone Encounter (Signed)
PA for Zepbound 15MG  has been submitted, awaiting PA questions.

## 2023-06-30 NOTE — Telephone Encounter (Signed)
PA questions for Zepbound 15MG   have been answered and all documentation has been included. Waiting on a determination.

## 2023-06-30 NOTE — Telephone Encounter (Signed)
PA for Zepbound 12.5 has been denied. PA is now complete. Patient is aware

## 2023-07-10 ENCOUNTER — Other Ambulatory Visit (INDEPENDENT_AMBULATORY_CARE_PROVIDER_SITE_OTHER): Payer: Self-pay | Admitting: Adult Health

## 2023-07-10 DIAGNOSIS — K219 Gastro-esophageal reflux disease without esophagitis: Secondary | ICD-10-CM

## 2023-07-22 ENCOUNTER — Ambulatory Visit (INDEPENDENT_AMBULATORY_CARE_PROVIDER_SITE_OTHER): Payer: BC Managed Care – PPO | Admitting: Adult Health

## 2023-07-22 VITALS — BP 136/88 | HR 97 | Temp 97.7°F | Ht 63.0 in | Wt 174.0 lb

## 2023-07-22 DIAGNOSIS — E559 Vitamin D deficiency, unspecified: Secondary | ICD-10-CM

## 2023-07-22 DIAGNOSIS — Z6830 Body mass index (BMI) 30.0-30.9, adult: Secondary | ICD-10-CM

## 2023-07-22 DIAGNOSIS — I10 Essential (primary) hypertension: Secondary | ICD-10-CM

## 2023-07-22 DIAGNOSIS — K219 Gastro-esophageal reflux disease without esophagitis: Secondary | ICD-10-CM

## 2023-07-22 DIAGNOSIS — E669 Obesity, unspecified: Secondary | ICD-10-CM

## 2023-07-22 DIAGNOSIS — R7303 Prediabetes: Secondary | ICD-10-CM | POA: Diagnosis not present

## 2023-07-22 DIAGNOSIS — K760 Fatty (change of) liver, not elsewhere classified: Secondary | ICD-10-CM

## 2023-07-22 DIAGNOSIS — E66812 Obesity, class 2: Secondary | ICD-10-CM

## 2023-07-22 MED ORDER — OMEPRAZOLE 20 MG PO CPDR: 20.0000 mg | DELAYED_RELEASE_CAPSULE | Freq: Every day | ORAL | 0 refills | Status: DC

## 2023-07-22 MED ORDER — ZEPBOUND 15 MG/0.5ML ~~LOC~~ SOAJ: 15.0000 mg | SUBCUTANEOUS | 0 refills | Status: DC

## 2023-07-22 NOTE — Progress Notes (Signed)
WEIGHT SUMMARY AND BIOMETRICS  Vitals Temp: 97.7 F (36.5 C) BP: 136/88 Pulse Rate: 97 SpO2: 98 %   Anthropometric Measurements Height: 5\' 3"  (1.6 m) Weight: 174 lb (78.9 kg) BMI (Calculated): 30.83 Weight at Last Visit: 179lb Weight Lost Since Last Visit: 5lb Weight Gained Since Last Visit: 0 Starting Weight: 201lb Total Weight Loss (lbs): 27 lb (12.2 kg)   Body Composition  Body Fat %: 40.6 % Fat Mass (lbs): 70.8 lbs Muscle Mass (lbs): 98.2 lbs Total Body Water (lbs): 70 lbs Visceral Fat Rating : 8   Other Clinical Data Fasting: yes Labs: yes Today's Visit #: 24 Starting Date: 01/23/21    Chief Complaint:   OBESITY Erin Flores is here to discuss her progress with her obesity treatment plan.  She is on the the Category 2 Plan and states she is following her eating plan approximately 90 % of the time.  She states she is exercising: None   Interim History:  She was started on Zepbound therapy 10/06/2022  06/29/2023 Zepbound increased from12.5mg  to MAX dose 15mg   Denies mass in neck, dysphagia, dyspepsia, persistent hoarseness, abdominal pain, or N/V/C    10/06/22  Height 5\' 3"  (1.6 m)  Weight 240 lb (108.9 kg)  BMI (Calculated) 42.52    07/22/23 07:00  Height 5\' 3"  (1.6 m)  Weight 174 lb (78.9 kg)  BMI (Calculated) 30.83  Total Weight Loss (lbs) 27 lb (12.2 kg)    Of Note- She reports 100% compliance of daily Slynd 4 mg  Subjective:   1. NAFLD (nonalcoholic fatty liver disease) She denies RUQ pain She rarely consumes ETOH  2. Prediabetes She is currently on daily Metformin 500mg  and weekly max dose Zepbound 15 mg Denies mass in neck, dysphagia, dyspepsia, persistent hoarseness, abdominal pain, or N/V/C   3. Gastroesophageal reflux disease, unspecified whether esophagitis present GERD sx's well controlled with daily Prilosec 20mg  She reports significant reduction in breakthrough sx's since losing weigh the last 6 months.  4. Essential  hypertension BP slightly above goal at OV She denies CP with exertion. She has not been exercising regularly the last several months due to acute bursitis. She reports pain has decreased the last several months  5. Vitamin D deficiency She endorses stable energy levels.  Latest Reference Range & Units 08/21/22 11:54 02/16/23 09:17  Vitamin D, 25-Hydroxy 30.0 - 100.0 ng/mL 60.1 53.4   Assessment/Plan:   1. NAFLD (nonalcoholic fatty liver disease) Check Labs - Comprehensive metabolic panel - Lipid Panel  2. Prediabetes (Primary) Check Labs - Hemoglobin A1c - Insulin, random  3. Gastroesophageal reflux disease, unspecified whether esophagitis present Refill omeprazole (PRILOSEC) 20 MG capsule Take 1 capsule (20 mg total) by mouth daily. Dispense: 90 capsule, Refills: 0 ordered   4. Essential hypertension Check Labs  5. Vitamin D deficiency Check Labs - VITAMIN D 25 Hydroxy (Vit-D Deficiency, Fractures)  6. Obesity with current BMI of 30.83 Refill  tirzepatide (ZEPBOUND) 15 MG/0.5ML Pen Inject 15 mg into the skin once a week. Dispense: 6 mL, Refills: 0 ordered   Erin Flores is currently in the action stage of change. As such, her goal is to continue with weight loss efforts. She has agreed to the Category 2 Plan.   Exercise goals: All adults should avoid inactivity. Some physical activity is better than none, and adults who participate in any amount of physical activity gain some health benefits. Adults should also include muscle-strengthening activities that involve all major muscle groups on 2 or more  days a week.  Behavioral modification strategies: increasing lean protein intake, decreasing simple carbohydrates, increasing vegetables, increasing water intake, no skipping meals, meal planning and cooking strategies, keeping healthy foods in the home, and planning for success.  Sophonie has agreed to follow-up with our clinic in 4 weeks. She was informed of the importance of  frequent follow-up visits to maximize her success with intensive lifestyle modifications for her multiple health conditions.   Erin Flores was informed we would discuss her lab results at her next visit unless there is a critical issue that needs to be addressed sooner. Erin Flores agreed to keep her next visit at the agreed upon time to discuss these results.  Objective:   Blood pressure 136/88, pulse 97, temperature 97.7 F (36.5 C), height 5\' 3"  (1.6 m), weight 174 lb (78.9 kg), SpO2 98%. Body mass index is 30.82 kg/m.  General: Cooperative, alert, well developed, in no acute distress. HEENT: Conjunctivae and lids unremarkable. Cardiovascular: Regular rhythm.  Lungs: Normal work of breathing. Neurologic: No focal deficits.   Lab Results  Component Value Date   CREATININE 0.9 02/06/2023   BUN 12 02/06/2023   NA 138 02/06/2023   K 4.5 02/06/2023   CL 99 02/06/2023   CO2 23 (A) 02/06/2023   Lab Results  Component Value Date   ALT 81 (A) 02/06/2023   AST 44 (A) 02/06/2023   ALKPHOS 116 02/06/2023   BILITOT 0.5 08/21/2022   Lab Results  Component Value Date   HGBA1C 5.5 02/16/2023   HGBA1C 5.8 (H) 08/21/2022   HGBA1C 5.0 10/18/2021   HGBA1C 5.3 05/14/2021   HGBA1C 5.5 01/23/2021   Lab Results  Component Value Date   INSULIN 29.9 (H) 02/16/2023   INSULIN 15.2 08/21/2022   INSULIN 6.8 05/14/2021   INSULIN 12.5 01/23/2021   Lab Results  Component Value Date   TSH 1.87 10/18/2021   Lab Results  Component Value Date   CHOL 264 (H) 05/14/2021   HDL 48 05/14/2021   LDLCALC 162 (H) 05/14/2021   TRIG 288 (H) 05/14/2021   CHOLHDL 5.5 (H) 05/14/2021   Lab Results  Component Value Date   VD25OH 53.4 02/16/2023   VD25OH 60.1 08/21/2022   VD25OH 51.0 01/23/2021   Lab Results  Component Value Date   WBC 9.5 02/06/2023   HGB 14.2 02/06/2023   HCT 44 02/06/2023   MCV 85.5 10/06/2022   PLT 371 10/06/2022   Lab Results  Component Value Date   IRON 94 05/14/2021   TIBC  539 (H) 05/14/2021   FERRITIN 21 05/14/2021   Attestation Statements:   Reviewed by clinician on day of visit: allergies, medications, problem list, medical history, surgical history, family history, social history, and previous encounter notes.  I have reviewed the above documentation for accuracy and completeness, and I agree with the above. -  Kolleen Ochsner d. Tiajuana Leppanen, NP-C

## 2023-07-23 LAB — COMPREHENSIVE METABOLIC PANEL
ALT: 56 [IU]/L — ABNORMAL HIGH (ref 0–32)
AST: 27 [IU]/L (ref 0–40)
Albumin: 4.5 g/dL (ref 3.9–4.9)
Alkaline Phosphatase: 109 [IU]/L (ref 44–121)
BUN/Creatinine Ratio: 12 (ref 9–23)
BUN: 10 mg/dL (ref 6–20)
Bilirubin Total: 0.7 mg/dL (ref 0.0–1.2)
CO2: 20 mmol/L (ref 20–29)
Calcium: 9.3 mg/dL (ref 8.7–10.2)
Chloride: 101 mmol/L (ref 96–106)
Creatinine, Ser: 0.82 mg/dL (ref 0.57–1.00)
Globulin, Total: 2.8 g/dL (ref 1.5–4.5)
Glucose: 70 mg/dL (ref 70–99)
Potassium: 4.2 mmol/L (ref 3.5–5.2)
Sodium: 141 mmol/L (ref 134–144)
Total Protein: 7.3 g/dL (ref 6.0–8.5)
eGFR: 93 mL/min/{1.73_m2} (ref >59)

## 2023-07-23 LAB — LIPID PANEL
Chol/HDL Ratio: 4.7 {ratio} — ABNORMAL HIGH (ref 0.0–4.4)
Cholesterol, Total: 233 mg/dL — ABNORMAL HIGH (ref 100–199)
HDL: 50 mg/dL (ref >39)
LDL Chol Calc (NIH): 154 mg/dL — ABNORMAL HIGH (ref 0–99)
Triglycerides: 158 mg/dL — ABNORMAL HIGH (ref 0–149)
VLDL Cholesterol Cal: 29 mg/dL (ref 5–40)

## 2023-07-23 LAB — HEMOGLOBIN A1C
Est. average glucose Bld gHb Est-mCnc: 108 mg/dL
Hgb A1c MFr Bld: 5.4 % (ref 4.8–5.6)

## 2023-07-23 LAB — VITAMIN D 25 HYDROXY (VIT D DEFICIENCY, FRACTURES): Vit D, 25-Hydroxy: 41.1 ng/mL (ref 30.0–100.0)

## 2023-07-23 LAB — INSULIN, RANDOM: INSULIN: 14.8 u[IU]/mL (ref 2.6–24.9)

## 2023-07-29 ENCOUNTER — Encounter (INDEPENDENT_AMBULATORY_CARE_PROVIDER_SITE_OTHER): Payer: Self-pay

## 2023-08-19 ENCOUNTER — Encounter (INDEPENDENT_AMBULATORY_CARE_PROVIDER_SITE_OTHER): Payer: Self-pay | Admitting: Adult Health

## 2023-08-19 ENCOUNTER — Ambulatory Visit (INDEPENDENT_AMBULATORY_CARE_PROVIDER_SITE_OTHER): Payer: BC Managed Care – PPO | Admitting: Adult Health

## 2023-08-19 VITALS — BP 134/87 | HR 97 | Temp 98.0°F | Ht 63.0 in | Wt 166.0 lb

## 2023-08-19 DIAGNOSIS — K219 Gastro-esophageal reflux disease without esophagitis: Secondary | ICD-10-CM | POA: Diagnosis not present

## 2023-08-19 DIAGNOSIS — E66812 Obesity, class 2: Secondary | ICD-10-CM

## 2023-08-19 DIAGNOSIS — K760 Fatty (change of) liver, not elsewhere classified: Secondary | ICD-10-CM

## 2023-08-19 DIAGNOSIS — I10 Essential (primary) hypertension: Secondary | ICD-10-CM

## 2023-08-19 DIAGNOSIS — E782 Mixed hyperlipidemia: Secondary | ICD-10-CM

## 2023-08-19 DIAGNOSIS — Z6829 Body mass index (BMI) 29.0-29.9, adult: Secondary | ICD-10-CM

## 2023-08-19 DIAGNOSIS — R7303 Prediabetes: Secondary | ICD-10-CM | POA: Diagnosis not present

## 2023-08-19 DIAGNOSIS — E669 Obesity, unspecified: Secondary | ICD-10-CM

## 2023-08-19 DIAGNOSIS — E88819 Insulin resistance, unspecified: Secondary | ICD-10-CM

## 2023-08-19 DIAGNOSIS — E559 Vitamin D deficiency, unspecified: Secondary | ICD-10-CM

## 2023-08-19 MED ORDER — OMEPRAZOLE 20 MG PO CPDR: 20.0000 mg | DELAYED_RELEASE_CAPSULE | Freq: Every day | ORAL | 0 refills | Status: DC

## 2023-08-19 MED ORDER — ZEPBOUND 15 MG/0.5ML ~~LOC~~ SOAJ: 15.0000 mg | SUBCUTANEOUS | 0 refills | Status: DC

## 2023-08-19 NOTE — Progress Notes (Signed)
 WEIGHT SUMMARY AND BIOMETRICS  Vitals Temp: 98 F (36.7 C) BP: 134/87 Pulse Rate: 97 SpO2: 99 %   Anthropometric Measurements Height: 5\' 3"  (1.6 m) Weight: 166 lb (75.3 kg) BMI (Calculated): 29.41 Weight at Last Visit: 174 lb Weight Lost Since Last Visit: 8 lb Weight Gained Since Last Visit: 0 lb Starting Weight: 201 lb Total Weight Loss (lbs): 35 lb (15.9 kg)   Body Composition  Body Fat %: 41.2 % Fat Mass (lbs): 68.4 lbs Muscle Mass (lbs): 92.6 lbs Total Body Water (lbs): 69.6 lbs Visceral Fat Rating : 8   Other Clinical Data Fasting: no Labs: no Today's Visit #: 25 Starting Date: 01/23/21    Chief Complaint:   OBESITY Erin Flores is here to discuss her progress with her obesity treatment plan.  She is on the the Category 2 Plan and states she is following her eating plan approximately 85 % of the time.  She states she is exercising Cardiovascular and Strength Training 10-15 minutes 3-4 times per week.   Interim History:  She was started on Zepbound therapy 10/06/2022  06/29/2023 Zepbound increased from12.5mg  to MAX dose 15mg   She is tolerating max Zepbound 15mg - denies SE  Hunger/appetite-stable appetite  Cravings- none  Stress- due to inadequate staffing, work has been stressful.  She works in [employer] 10-15 mins at least 3-4 times per week   Subjective:   1. Gastroesophageal reflux disease, unspecified whether esophagitis present Sx's well controlled with daily Prilosec 20mg   She denies GERD sx's interrupting sleep  2. Insulin resistance Discussed Labs  Latest Reference Range & Units 08/21/22 11:54 02/16/23 09:17 07/22/23 08:40  INSULIN 2.6 - 24.9 uIU/mL 15.2 29.9 (H) 14.8  (H): Data is abnormally high  Insulin level improved, however still slightly above goal She was started on Zepbound therapy 10/06/2022  06/29/2023 Zepbound increased from12.5mg  to MAX dose 15mg   She is tolerating  max Zepbound 15mg - denies SE  3. Prediabetes Discussed Labs  Latest Reference Range & Units 07/22/23 08:40  Glucose 70 - 99 mg/dL 70  Hemoglobin U1L 4.8 - 5.6 % 5.4  Est. average glucose Bld gHb Est-mCnc mg/dL 244  INSULIN 2.6 - 01.0 uIU/mL 14.8   CBG and A1c both at goal She was started on Zepbound therapy 10/06/2022  06/29/2023 Zepbound increased from12.5mg  to MAX dose 15mg   She is tolerating max Zepbound 15mg - denies SE  4. Essential hypertension Discussed Labs 07/22/2023 CMP: Electrolytes, Kidney Fx, Liver enzymes- normal She stopped taking Avapro 150mg  approximately 6 weeks ago. Her BP has been trending up and she has been experiencing intermittent HA  5. Vitamin D deficiency Discussed Labs  Latest Reference Range & Units 07/22/23 08:40  Vitamin D, 25-Hydroxy 30.0 - 100.0 ng/mL 41.1   She stopped daily Adderall months ago She endorses profound fatigue the last several months  6. NAFLD (nonalcoholic fatty liver disease) Discussed Labs 07/22/2023 CMP- Liver enzymes improved She is on max dose weekly Zepbound 15mg   7. Mixed HLD Discussed Labs Lipid Panel     Component Value Date/Time   CHOL 233 (H) 07/22/2023 0847   TRIG 158 (H) 07/22/2023 0847   HDL 50 07/22/2023 0847   CHOLHDL 4.7 (H) 07/22/2023 0847   CHOLHDL 4 02/24/2020 0852   VLDL 37.0 02/24/2020 0852   LDLCALC 154 (H) 07/22/2023 0847   LABVLDL 29 07/22/2023 0847    Total, TGs, LDL all improved from previous check, however still above goal She is on max dose  Zepbound 15mg  She denies tobacco/vape use She denies CP with exertion  Assessment/Plan:   1. Gastroesophageal reflux disease, unspecified whether esophagitis present Refill - omeprazole (PRILOSEC) 20 MG capsule; Take 1 capsule (20 mg total) by mouth daily.  Dispense: 90 capsule; Refill: 0  2. Insulin resistance Increase protein Limit sugar/simple CHO Continue regular exercise  3. Prediabetes (Primary) Increase protein Limit sugar/simple  CHO Continue regular exercise  4. Essential hypertension Restart Avapro 150mg  1/2 tab daily Monitor BP- bring log to next OV  5. Vitamin D deficiency Increase regular exercise  6. NAFLD (nonalcoholic fatty liver disease) Continue weight loss efforts Remain active  7. Mixed HLD Reduce saturated fat intake Continue regular exercise  8. Obesity, Starting BMI 33.67 Refill  tirzepatide (ZEPBOUND) 15 MG/0.5ML Pen Inject 15 mg into the skin once a week. Dispense: 6 mL, Refills: 0 ordered   Erin Flores is currently in the action stage of change. As such, her goal is to continue with weight loss efforts. She has agreed to the Category 2 Plan.   Exercise goals: For substantial health benefits, adults should do at least 150 minutes (2 hours and 30 minutes) a week of moderate-intensity, or 75 minutes (1 hour and 15 minutes) a week of vigorous-intensity aerobic physical activity, or an equivalent combination of moderate- and vigorous-intensity aerobic activity. Aerobic activity should be performed in episodes of at least 10 minutes, and preferably, it should be spread throughout the week.  Behavioral modification strategies: increasing lean protein intake, decreasing simple carbohydrates, increasing vegetables, increasing water intake, no skipping meals, meal planning and cooking strategies, keeping healthy foods in the home, and planning for success.  Erin Flores has agreed to follow-up with our clinic in 4 weeks. She was informed of the importance of frequent follow-up visits to maximize her success with intensive lifestyle modifications for her multiple health conditions.   Objective:   Blood pressure 134/87, pulse 97, temperature 98 F (36.7 C), height 5\' 3"  (1.6 m), weight 166 lb (75.3 kg), SpO2 99%. Body mass index is 29.41 kg/m.  General: Cooperative, alert, well developed, in no acute distress. HEENT: Conjunctivae and lids unremarkable. Cardiovascular: Regular rhythm.  Lungs: Normal work  of breathing. Neurologic: No focal deficits.   Lab Results  Component Value Date   CREATININE 0.82 07/22/2023   BUN 10 07/22/2023   NA 141 07/22/2023   K 4.2 07/22/2023   CL 101 07/22/2023   CO2 20 07/22/2023   Lab Results  Component Value Date   ALT 56 (H) 07/22/2023   AST 27 07/22/2023   ALKPHOS 109 07/22/2023   BILITOT 0.7 07/22/2023   Lab Results  Component Value Date   HGBA1C 5.4 07/22/2023   HGBA1C 5.5 02/16/2023   HGBA1C 5.8 (H) 08/21/2022   HGBA1C 5.0 10/18/2021   HGBA1C 5.3 05/14/2021   Lab Results  Component Value Date   INSULIN 14.8 07/22/2023   INSULIN 29.9 (H) 02/16/2023   INSULIN 15.2 08/21/2022   INSULIN 6.8 05/14/2021   INSULIN 12.5 01/23/2021   Lab Results  Component Value Date   TSH 1.87 10/18/2021   Lab Results  Component Value Date   CHOL 233 (H) 07/22/2023   HDL 50 07/22/2023   LDLCALC 154 (H) 07/22/2023   TRIG 158 (H) 07/22/2023   CHOLHDL 4.7 (H) 07/22/2023   Lab Results  Component Value Date   VD25OH 41.1 07/22/2023   VD25OH 53.4 02/16/2023   VD25OH 60.1 08/21/2022   Lab Results  Component Value Date   WBC 9.5 02/06/2023  HGB 14.2 02/06/2023   HCT 44 02/06/2023   MCV 85.5 10/06/2022   PLT 371 10/06/2022   Lab Results  Component Value Date   IRON 94 05/14/2021   TIBC 539 (H) 05/14/2021   FERRITIN 21 05/14/2021   Attestation Statements:   Reviewed by clinician on day of visit: allergies, medications, problem list, medical history, surgical history, family history, social history, and previous encounter notes.  I have reviewed the above documentation for accuracy and completeness, and I agree with the above. -  Erin Flores d. Erin Viall, NP-C

## 2023-08-24 DIAGNOSIS — M25551 Pain in right hip: Secondary | ICD-10-CM | POA: Diagnosis not present

## 2023-09-04 ENCOUNTER — Other Ambulatory Visit (HOSPITAL_COMMUNITY): Payer: Self-pay

## 2023-09-16 DIAGNOSIS — F319 Bipolar disorder, unspecified: Secondary | ICD-10-CM | POA: Diagnosis not present

## 2023-09-17 DIAGNOSIS — Z01419 Encounter for gynecological examination (general) (routine) without abnormal findings: Secondary | ICD-10-CM | POA: Diagnosis not present

## 2023-09-17 DIAGNOSIS — Z1331 Encounter for screening for depression: Secondary | ICD-10-CM | POA: Diagnosis not present

## 2023-10-07 ENCOUNTER — Encounter (INDEPENDENT_AMBULATORY_CARE_PROVIDER_SITE_OTHER): Payer: Self-pay | Admitting: Adult Health

## 2023-10-07 ENCOUNTER — Ambulatory Visit (INDEPENDENT_AMBULATORY_CARE_PROVIDER_SITE_OTHER): Admitting: Adult Health

## 2023-10-07 VITALS — BP 120/83 | HR 72 | Temp 98.0°F | Ht 63.0 in | Wt 157.0 lb

## 2023-10-07 DIAGNOSIS — K219 Gastro-esophageal reflux disease without esophagitis: Secondary | ICD-10-CM | POA: Diagnosis not present

## 2023-10-07 DIAGNOSIS — Z6827 Body mass index (BMI) 27.0-27.9, adult: Secondary | ICD-10-CM

## 2023-10-07 DIAGNOSIS — K760 Fatty (change of) liver, not elsewhere classified: Secondary | ICD-10-CM

## 2023-10-07 DIAGNOSIS — E66812 Obesity, class 2: Secondary | ICD-10-CM

## 2023-10-07 DIAGNOSIS — F4321 Adjustment disorder with depressed mood: Secondary | ICD-10-CM | POA: Diagnosis not present

## 2023-10-07 DIAGNOSIS — R7303 Prediabetes: Secondary | ICD-10-CM

## 2023-10-07 DIAGNOSIS — E88819 Insulin resistance, unspecified: Secondary | ICD-10-CM

## 2023-10-07 DIAGNOSIS — E669 Obesity, unspecified: Secondary | ICD-10-CM

## 2023-10-07 MED ORDER — OMEPRAZOLE 20 MG PO CPDR: 20.0000 mg | DELAYED_RELEASE_CAPSULE | Freq: Every day | ORAL | 0 refills | Status: DC

## 2023-10-07 MED ORDER — ZEPBOUND 15 MG/0.5ML ~~LOC~~ SOAJ: 15.0000 mg | SUBCUTANEOUS | 0 refills | Status: DC

## 2023-10-07 NOTE — Progress Notes (Signed)
 WEIGHT SUMMARY AND BIOMETRICS  Vitals Temp: 98 F (36.7 C) BP: 120/83 Pulse Rate: 72 SpO2: 98 %   Anthropometric Measurements Height: 5\' 3"  (1.6 m) Weight: 157 lb (71.2 kg) BMI (Calculated): 27.82 Weight at Last Visit: 166 lb Weight Lost Since Last Visit: 9 lb Weight Gained Since Last Visit: 0 Starting Weight: 201 lb Total Weight Loss (lbs): 44 lb (20 kg)   Body Composition  Body Fat %: 41 % Fat Mass (lbs): 64.6 lbs Muscle Mass (lbs): 88.2 lbs Total Body Water  (lbs): 69.2 lbs Visceral Fat Rating : 7   Other Clinical Data Fasting: no Labs: no Today's Visit #: 26 Starting Date: 01/23/21    Chief Complaint:   OBESITY Erin Flores is here to discuss her progress with her obesity treatment plan.  She is on the the Category 2 Plan and states she is following her eating plan approximately 70-80 % of the time.  She states she is exercising: None  Interim History:  Stress- he close friend passed away while driving to work. They have been friends for close to 20 years. Erin Flores endorses increased sx's of depression. She endorses recent increase in emotional eating since her dear friend tragically died in MVC 2 weeks ago.  She was started on Zepbound  therapy 10/06/2022  06/29/2023 Zepbound  increased from12.5mg  to MAX dose 15mg   She is tolerating max Zepbound  15mg - denies SE  Subjective:   1. Gastroesophageal reflux disease, unspecified whether esophagitis present She denies worsening GERD sx's She is on daily Omeprazole  20mg   2. Insulin  resistance She was started on Zepbound  therapy 10/06/2022  06/29/2023 Zepbound  increased from12.5mg  to MAX dose 15mg   She is tolerating max Zepbound  15mg - denies SE  She endorses recent increase in emotional eating since her dear friend tragically died in MVC  3. Prediabetes Lab Results  Component Value Date   HGBA1C 5.4 07/22/2023   HGBA1C 5.5 02/16/2023   HGBA1C 5.8 (H) 08/21/2022    She was started on Zepbound  therapy  10/06/2022  06/29/2023 Zepbound  increased from12.5mg  to MAX dose 15mg   She is tolerating max Zepbound  15mg - denies SE  5. Grief Stress- he close friend passed away while driving to work. They have been friends for close to 20 years. Erin Flores endorses increased sx's of depression and emotional eating the last 2 weeks She is currently daily Prozac 10mg  and Pristiq 50mg  She vehemently denies SI/HI  Assessment/Plan:   1. Gastroesophageal reflux disease, unspecified whether esophagitis present Avoid known trigger foods Do not overeat or too closely to bed.  2. Insulin  resistance Continue healthy and increase daily walking  3. Prediabetes Continue healthy and increase daily walking  4. Grief (Primary) Surround self with positive friends/family Start walking with husband  5. Obesity with current BMI of 27.9  Erin Flores is currently in the action stage of change. As such, her goal is to continue with weight loss efforts. She has agreed to the Category 2 Plan.   Exercise goals: All adults should avoid inactivity. Some physical activity is better than none, and adults who participate in any amount of physical activity gain some health benefits. Adults should also include muscle-strengthening activities that involve all major muscle groups on 2 or more days a week.  Behavioral modification strategies: increasing lean protein intake, decreasing simple carbohydrates, increasing vegetables, increasing water  intake, no skipping meals, meal planning and cooking strategies, keeping healthy foods in the home, ways to avoid boredom eating, and planning for success.  Erin Flores has agreed to follow-up with  our clinic in 4 weeks. She was informed of the importance of frequent follow-up visits to maximize her success with intensive lifestyle modifications for her multiple health conditions.   Objective:   Blood pressure 120/83, pulse 72, temperature 98 F (36.7 C), height 5\' 3"  (1.6 m), weight 157 lb  (71.2 kg), SpO2 98%. Body mass index is 27.81 kg/m.  General: Cooperative, alert, well developed, in no acute distress. HEENT: Conjunctivae and lids unremarkable. Cardiovascular: Regular rhythm.  Lungs: Normal work of breathing. Neurologic: No focal deficits.   Lab Results  Component Value Date   CREATININE 0.82 07/22/2023   BUN 10 07/22/2023   NA 141 07/22/2023   K 4.2 07/22/2023   CL 101 07/22/2023   CO2 20 07/22/2023   Lab Results  Component Value Date   ALT 56 (H) 07/22/2023   AST 27 07/22/2023   ALKPHOS 109 07/22/2023   BILITOT 0.7 07/22/2023   Lab Results  Component Value Date   HGBA1C 5.4 07/22/2023   HGBA1C 5.5 02/16/2023   HGBA1C 5.8 (H) 08/21/2022   HGBA1C 5.0 10/18/2021   HGBA1C 5.3 05/14/2021   Lab Results  Component Value Date   INSULIN  14.8 07/22/2023   INSULIN  29.9 (H) 02/16/2023   INSULIN  15.2 08/21/2022   INSULIN  6.8 05/14/2021   INSULIN  12.5 01/23/2021   Lab Results  Component Value Date   TSH 1.87 10/18/2021   Lab Results  Component Value Date   CHOL 233 (H) 07/22/2023   HDL 50 07/22/2023   LDLCALC 154 (H) 07/22/2023   TRIG 158 (H) 07/22/2023   CHOLHDL 4.7 (H) 07/22/2023   Lab Results  Component Value Date   VD25OH 41.1 07/22/2023   VD25OH 53.4 02/16/2023   VD25OH 60.1 08/21/2022   Lab Results  Component Value Date   WBC 9.5 02/06/2023   HGB 14.2 02/06/2023   HCT 44 02/06/2023   MCV 85.5 10/06/2022   PLT 371 10/06/2022   Lab Results  Component Value Date   IRON 94 05/14/2021   TIBC 539 (H) 05/14/2021   FERRITIN 21 05/14/2021   Attestation Statements:   Reviewed by clinician on day of visit: allergies, medications, problem list, medical history, surgical history, family history, social history, and previous encounter notes.  I have reviewed the above documentation for accuracy and completeness, and I agree with the above. -  Lissette Schenk d. Asanti Craigo, NP-C

## 2023-11-04 ENCOUNTER — Ambulatory Visit (INDEPENDENT_AMBULATORY_CARE_PROVIDER_SITE_OTHER): Admitting: Adult Health

## 2023-11-04 ENCOUNTER — Encounter (INDEPENDENT_AMBULATORY_CARE_PROVIDER_SITE_OTHER): Payer: Self-pay | Admitting: Adult Health

## 2023-11-04 VITALS — BP 111/74 | HR 102 | Temp 98.3°F | Ht 63.0 in | Wt 153.0 lb

## 2023-11-04 DIAGNOSIS — Z6827 Body mass index (BMI) 27.0-27.9, adult: Secondary | ICD-10-CM

## 2023-11-04 DIAGNOSIS — R7303 Prediabetes: Secondary | ICD-10-CM

## 2023-11-04 DIAGNOSIS — E559 Vitamin D deficiency, unspecified: Secondary | ICD-10-CM

## 2023-11-04 DIAGNOSIS — E669 Obesity, unspecified: Secondary | ICD-10-CM

## 2023-11-04 DIAGNOSIS — E88819 Insulin resistance, unspecified: Secondary | ICD-10-CM

## 2023-11-04 MED ORDER — ZEPBOUND 15 MG/0.5ML ~~LOC~~ SOAJ: 15.0000 mg | SUBCUTANEOUS | 0 refills | Status: DC

## 2023-11-04 NOTE — Progress Notes (Unsigned)
 WEIGHT SUMMARY AND BIOMETRICS  Vitals Temp: 98.3 F (36.8 C) BP: 111/74 Pulse Rate: (!) 102 SpO2: 99 %   Anthropometric Measurements Height: 5\' 3"  (1.6 m) Weight: 153 lb (69.4 kg) BMI (Calculated): 27.11 Weight at Last Visit: 157 lb Weight Lost Since Last Visit: 4 lb Weight Gained Since Last Visit: 0 Starting Weight: 201 lb Total Weight Loss (lbs): 48 lb (21.8 kg)   Body Composition  Body Fat %: 38.3 % Fat Mass (lbs): 58.6 lbs Muscle Mass (lbs): 89.8 lbs Total Body Water  (lbs): 68.2 lbs Visceral Fat Rating : 6   Other Clinical Data Fasting: no Labs: no Today's Visit #: 27 Starting Date: 01/23/21    Chief Complaint:   OBESITY Tamantha is here to discuss her progress with her obesity treatment plan.  She is on the the Category 2 Plan and states she is following her eating plan approximately 80 % of the time.  She states she is exercising Walking 30-60 minutes 6 times per week.   Interim History:  Current Weight 153 lbs with corresponding BMI 27.1 Goal Weight 144 lbs with corresponding BMI  Exercise-Brisk Walking 30-60, 6 days weekly  Hydration-she estimates to drink 60-80 oz water /day  Of Note- She was started on Zepbound  therapy 10/06/2022  06/29/2023 Zepbound  increased from12.5mg  to MAX dose 15mg   She is tolerating max Zepbound  15mg - denies SE  Subjective:   1. Insulin  resistance  Latest Reference Range & Units 08/21/22 11:54 02/16/23 09:17 07/22/23 08:40  INSULIN  2.6 - 24.9 uIU/mL 15.2 29.9 (H) 14.8  (H): Data is abnormally high  She was started on Zepbound  therapy 10/06/2022  06/29/2023 Zepbound  increased from12.5mg  to MAX dose 15mg   She is tolerating max Zepbound  15mg - denies SE  2. Vitamin D  deficiency  Latest Reference Range & Units 08/21/22 11:54 02/16/23 09:17 07/22/23 08:40  Vitamin D , 25-Hydroxy 30.0 - 100.0 ng/mL 60.1 53.4 41.1   She endorses stable energy levels  3. Prediabetes Lab Results  Component Value Date   HGBA1C 5.4  07/22/2023   HGBA1C 5.5 02/16/2023   HGBA1C 5.8 (H) 08/21/2022   She was started on Zepbound  therapy 10/06/2022  06/29/2023 Zepbound  increased from12.5mg  to MAX dose 15mg   She is tolerating max Zepbound  15mg - denies SE   She is currently on daily Metformin  500mg  and weekly max dose Zepbound  15mg  She administers Zepbound  15mg  each Thursday. She will experience noticeable polyphagia on Wednesday  Assessment/Plan:   1. Insulin  resistance (Primary) Continue Cat 2 MP, walking, Metformin  500mg  daily, and Zepbound  15mg  weekly  2. Vitamin D  deficiency Monitor Labs  3. Prediabetes Continue Cat 2 MP, walking, Metformin  500mg  daily, and Zepbound  15mg  weekly  4. Obesity with current BMI of 27.1 Refill tirzepatide  (ZEPBOUND ) 15 MG/0.5ML Pen Inject 15 mg into the skin once a week. Dispense: 6 mL, Refills: 0 ordered   Shawneequa is currently in the action stage of change. As such, her goal is to continue with weight loss efforts. She has agreed to the Category 2 Plan.   Exercise goals: For substantial health benefits, adults should do at least 150 minutes (2 hours and 30 minutes) a week of moderate-intensity, or 75 minutes (1 hour and 15 minutes) a week of vigorous-intensity aerobic physical activity, or an equivalent combination of moderate- and vigorous-intensity aerobic activity. Aerobic activity should be performed in episodes of at least 10 minutes, and preferably, it should be spread throughout the week.  Behavioral modification strategies: increasing lean protein intake, decreasing simple carbohydrates, increasing vegetables, increasing water   intake, no skipping meals, meal planning and cooking strategies, keeping healthy foods in the home, ways to avoid boredom eating, and planning for success.  Derrica has agreed to follow-up with our clinic in 4 weeks. She was informed of the importance of frequent follow-up visits to maximize her success with intensive lifestyle modifications for her multiple  health conditions.   Objective:   Blood pressure 111/74, pulse (!) 102, temperature 98.3 F (36.8 C), height 5\' 3"  (1.6 m), weight 153 lb (69.4 kg), SpO2 99%. Body mass index is 27.1 kg/m.  General: Cooperative, alert, well developed, in no acute distress. HEENT: Conjunctivae and lids unremarkable. Cardiovascular: Regular rhythm.  Lungs: Normal work of breathing. Neurologic: No focal deficits.   Lab Results  Component Value Date   CREATININE 0.82 07/22/2023   BUN 10 07/22/2023   NA 141 07/22/2023   K 4.2 07/22/2023   CL 101 07/22/2023   CO2 20 07/22/2023   Lab Results  Component Value Date   ALT 56 (H) 07/22/2023   AST 27 07/22/2023   ALKPHOS 109 07/22/2023   BILITOT 0.7 07/22/2023   Lab Results  Component Value Date   HGBA1C 5.4 07/22/2023   HGBA1C 5.5 02/16/2023   HGBA1C 5.8 (H) 08/21/2022   HGBA1C 5.0 10/18/2021   HGBA1C 5.3 05/14/2021   Lab Results  Component Value Date   INSULIN  14.8 07/22/2023   INSULIN  29.9 (H) 02/16/2023   INSULIN  15.2 08/21/2022   INSULIN  6.8 05/14/2021   INSULIN  12.5 01/23/2021   Lab Results  Component Value Date   TSH 1.87 10/18/2021   Lab Results  Component Value Date   CHOL 233 (H) 07/22/2023   HDL 50 07/22/2023   LDLCALC 154 (H) 07/22/2023   TRIG 158 (H) 07/22/2023   CHOLHDL 4.7 (H) 07/22/2023   Lab Results  Component Value Date   VD25OH 41.1 07/22/2023   VD25OH 53.4 02/16/2023   VD25OH 60.1 08/21/2022   Lab Results  Component Value Date   WBC 9.5 02/06/2023   HGB 14.2 02/06/2023   HCT 44 02/06/2023   MCV 85.5 10/06/2022   PLT 371 10/06/2022   Lab Results  Component Value Date   IRON 94 05/14/2021   TIBC 539 (H) 05/14/2021   FERRITIN 21 05/14/2021   Attestation Statements:   Reviewed by clinician on day of visit: allergies, medications, problem list, medical history, surgical history, family history, social history, and previous encounter notes.  I have reviewed the above documentation for accuracy and  completeness, and I agree with the above. -  Taseen Marasigan d. Emmakate Hypes, NP-C

## 2023-12-08 DIAGNOSIS — F319 Bipolar disorder, unspecified: Secondary | ICD-10-CM | POA: Diagnosis not present

## 2023-12-09 ENCOUNTER — Encounter (INDEPENDENT_AMBULATORY_CARE_PROVIDER_SITE_OTHER): Payer: Self-pay | Admitting: Adult Health

## 2023-12-09 ENCOUNTER — Ambulatory Visit (INDEPENDENT_AMBULATORY_CARE_PROVIDER_SITE_OTHER): Admitting: Adult Health

## 2023-12-09 VITALS — BP 102/69 | HR 87 | Temp 97.8°F | Ht 63.0 in | Wt 148.0 lb

## 2023-12-09 DIAGNOSIS — E559 Vitamin D deficiency, unspecified: Secondary | ICD-10-CM

## 2023-12-09 DIAGNOSIS — E669 Obesity, unspecified: Secondary | ICD-10-CM | POA: Diagnosis not present

## 2023-12-09 DIAGNOSIS — Z6826 Body mass index (BMI) 26.0-26.9, adult: Secondary | ICD-10-CM

## 2023-12-09 DIAGNOSIS — E88819 Insulin resistance, unspecified: Secondary | ICD-10-CM

## 2023-12-09 DIAGNOSIS — K760 Fatty (change of) liver, not elsewhere classified: Secondary | ICD-10-CM | POA: Diagnosis not present

## 2023-12-09 MED ORDER — ZEPBOUND 15 MG/0.5ML ~~LOC~~ SOAJ: 15.0000 mg | SUBCUTANEOUS | 0 refills | Status: DC

## 2023-12-09 NOTE — Progress Notes (Addendum)
 WEIGHT SUMMARY AND BIOMETRICS  Vitals Temp: 97.8 F (36.6 C) BP: 102/69 Pulse Rate: 87 SpO2: 99 %   Anthropometric Measurements Height: 5' 3 (1.6 m) Weight: 148 lb (67.1 kg) BMI (Calculated): 26.22 Weight at Last Visit: 153 LB Weight Lost Since Last Visit: 5 lb Weight Gained Since Last Visit: 0 Starting Weight: 201 LB Total Weight Loss (lbs): 53 lb (24 kg)   Body Composition  Body Fat %: 36.9 % Fat Mass (lbs): 55 lbs Muscle Mass (lbs): 89 lbs Total Body Water  (lbs): 64.8 lbs Visceral Fat Rating : 6   Other Clinical Data Fasting: NO Labs: NO Today's Visit #: 28 Starting Date: 01/23/21    Chief Complaint:   OBESITY Erin Flores is here to discuss her progress with her obesity treatment plan.  She is on the the Category 2 Plan and states she is following her eating plan approximately 80 % of the time.  She states she is exercising: Vacation- walking  Interim History:   10/06/22  Height 5' 3 (1.6 m)  Weight 240 lb (108.9 kg)  BMI (Calculated) 42.52    12/09/23 07:00  Weight 148 lb (67.1 kg)  BMI (Calculated) 26.22  Total Weight Loss (lbs) 53 lb (24 kg)   She started on weekly Zepbound  therapy May 2024 She has lost 92 lbs from May 2024 until today She is currently on weekly Zebbound 15mg  Denies mass in neck, dysphagia, dyspepsia, persistent hoarseness, abdominal pain, or N/V/C   After another 5 lbs weight loss, consider beginning Mx Phase (rec IC check at that time)  Subjective:   1. NAFLD (nonalcoholic fatty liver disease) She rarely consumes ETOH She denies RUQ She is on max dose weekly Zepbound  15mg  Denies mass in neck, dysphagia, dyspepsia, persistent hoarseness, abdominal pain, or N/V/C   2. Vitamin D  deficiency  Latest Reference Range & Units 08/21/22 11:54 02/16/23 09:17 07/22/23 08:40  Vitamin D , 25-Hydroxy 30.0 - 100.0 ng/mL 60.1 53.4 41.1    3. Insulin  resistance  Latest Reference Range & Units 02/16/23 09:17 07/22/23 08:40   INSULIN  2.6 - 24.9 uIU/mL 29.9 (H) 14.8  (H): Data is abnormally high  She is on max dose weekly Zepbound  15mg  She is also on daily Metformin  500mg  Denies mass in neck, dysphagia, dyspepsia, persistent hoarseness, abdominal pain, or N/V/C She endorses stable appetite  Assessment/Plan:   1. NAFLD (nonalcoholic fatty liver disease) Continue healthy eating and resume regular exercise  2. Vitamin D  deficiency Check Labs early Fall 2025  3. Insulin  resistance Continue healthy eating and resume regular exercise Continue current antidiabetic medications  4. Obesity with current BMI of 26.4 (Primary) Refill tirzepatide  (ZEPBOUND ) 15 MG/0.5ML Pen Inject 15 mg into the skin once a week. Dispense: 6 mL, Refills: 0 ordered   Erin Flores is currently in the action stage of change. As such, her goal is to continue with weight loss efforts. She has agreed to the Category 2 Plan.   Exercise goals: For substantial health benefits, adults should do at least 150 minutes (2 hours and 30 minutes) a week of moderate-intensity, or 75 minutes (1 hour and 15 minutes) a week of vigorous-intensity aerobic physical activity, or an equivalent combination of moderate- and vigorous-intensity aerobic activity. Aerobic activity should be performed in episodes of at least 10 minutes, and preferably, it should be spread throughout the week.  Behavioral modification strategies: increasing lean protein intake, decreasing simple carbohydrates, increasing vegetables, increasing water  intake, no skipping meals, meal planning and cooking strategies, keeping healthy foods  in the home, and planning for success.  Erin Flores has agreed to follow-up with our clinic in 4 weeks. She was informed of the importance of frequent follow-up visits to maximize her success with intensive lifestyle modifications for her multiple health conditions.   Check Fasting Labs and IC at Sept OV  Objective:   Blood pressure 102/69, pulse 87,  temperature 97.8 F (36.6 C), height 5' 3 (1.6 m), weight 148 lb (67.1 kg), SpO2 99%. Body mass index is 26.22 kg/m.  General: Cooperative, alert, well developed, in no acute distress. HEENT: Conjunctivae and lids unremarkable. Cardiovascular: Regular rhythm.  Lungs: Normal work of breathing. Neurologic: No focal deficits.   Lab Results  Component Value Date   CREATININE 0.82 07/22/2023   BUN 10 07/22/2023   NA 141 07/22/2023   K 4.2 07/22/2023   CL 101 07/22/2023   CO2 20 07/22/2023   Lab Results  Component Value Date   ALT 56 (H) 07/22/2023   AST 27 07/22/2023   ALKPHOS 109 07/22/2023   BILITOT 0.7 07/22/2023   Lab Results  Component Value Date   HGBA1C 5.4 07/22/2023   HGBA1C 5.5 02/16/2023   HGBA1C 5.8 (H) 08/21/2022   HGBA1C 5.0 10/18/2021   HGBA1C 5.3 05/14/2021   Lab Results  Component Value Date   INSULIN  14.8 07/22/2023   INSULIN  29.9 (H) 02/16/2023   INSULIN  15.2 08/21/2022   INSULIN  6.8 05/14/2021   INSULIN  12.5 01/23/2021   Lab Results  Component Value Date   TSH 1.87 10/18/2021   Lab Results  Component Value Date   CHOL 233 (H) 07/22/2023   HDL 50 07/22/2023   LDLCALC 154 (H) 07/22/2023   TRIG 158 (H) 07/22/2023   CHOLHDL 4.7 (H) 07/22/2023   Lab Results  Component Value Date   VD25OH 41.1 07/22/2023   VD25OH 53.4 02/16/2023   VD25OH 60.1 08/21/2022   Lab Results  Component Value Date   WBC 9.5 02/06/2023   HGB 14.2 02/06/2023   HCT 44 02/06/2023   MCV 85.5 10/06/2022   PLT 371 10/06/2022   Lab Results  Component Value Date   IRON 94 05/14/2021   TIBC 539 (H) 05/14/2021   FERRITIN 21 05/14/2021   Attestation Statements:   Reviewed by clinician on day of visit: allergies, medications, problem list, medical history, surgical history, family history, social history, and previous encounter notes.  I have reviewed the above documentation for accuracy and completeness, and I agree with the above. -  Tasheema Perrone d. Sharonlee Nine, NP-C

## 2024-01-06 ENCOUNTER — Ambulatory Visit (INDEPENDENT_AMBULATORY_CARE_PROVIDER_SITE_OTHER): Admitting: Adult Health

## 2024-01-06 ENCOUNTER — Encounter (INDEPENDENT_AMBULATORY_CARE_PROVIDER_SITE_OTHER): Payer: Self-pay | Admitting: Adult Health

## 2024-01-06 VITALS — BP 103/68 | HR 94 | Temp 98.2°F | Ht 63.0 in | Wt 148.0 lb

## 2024-01-06 DIAGNOSIS — K219 Gastro-esophageal reflux disease without esophagitis: Secondary | ICD-10-CM

## 2024-01-06 DIAGNOSIS — Z6826 Body mass index (BMI) 26.0-26.9, adult: Secondary | ICD-10-CM

## 2024-01-06 DIAGNOSIS — E88819 Insulin resistance, unspecified: Secondary | ICD-10-CM | POA: Diagnosis not present

## 2024-01-06 DIAGNOSIS — R7303 Prediabetes: Secondary | ICD-10-CM

## 2024-01-06 DIAGNOSIS — E66812 Obesity, class 2: Secondary | ICD-10-CM

## 2024-01-06 DIAGNOSIS — I10 Essential (primary) hypertension: Secondary | ICD-10-CM | POA: Diagnosis not present

## 2024-01-06 DIAGNOSIS — E669 Obesity, unspecified: Secondary | ICD-10-CM

## 2024-01-06 DIAGNOSIS — E559 Vitamin D deficiency, unspecified: Secondary | ICD-10-CM

## 2024-01-06 MED ORDER — OMEPRAZOLE 20 MG PO CPDR: 20.0000 mg | DELAYED_RELEASE_CAPSULE | Freq: Every day | ORAL | 0 refills | Status: DC

## 2024-01-06 MED ORDER — METFORMIN HCL 500 MG PO TABS: ORAL_TABLET | ORAL | 0 refills | Status: DC

## 2024-01-06 MED ORDER — IRBESARTAN 75 MG PO TABS: ORAL_TABLET | ORAL | 0 refills | Status: DC

## 2024-01-06 NOTE — Progress Notes (Signed)
 WEIGHT SUMMARY AND BIOMETRICS  Vitals Temp: 98.2 F (36.8 C) BP: 103/68 Pulse Rate: 94 SpO2: 98 %   Anthropometric Measurements Height: 5' 3 (1.6 m) Weight: 148 lb (67.1 kg) BMI (Calculated): 26.22 Weight at Last Visit: 148 lb Weight Lost Since Last Visit: 0 Weight Gained Since Last Visit: 0 Starting Weight: 201 lb Total Weight Loss (lbs): 53 lb (24 kg) Peak Weight: 243 lb   Body Composition  Body Fat %: 38 % Fat Mass (lbs): 56.2 lbs Muscle Mass (lbs): 87.2 lbs Total Body Water  (lbs): 67.2 lbs Visceral Fat Rating : 6   Other Clinical Data Fasting: no Labs: no Today's Visit #: 29 Starting Date: 01/23/21    Chief Complaint:   OBESITY Erin Flores is here to discuss her progress with her obesity treatment plan.  She is on the the Category 2 Plan and states she is following her eating plan approximately 60 % of the time.  She states she is exercising Cardiovascular Exercise and Strength Training 45-60 minutes 3 times per week.  Interim History:  She started on weekly Zepbound  therapy May 2024 She has lost 92 lbs from May 2024 until today She is currently on weekly Zebbound 15mg  Denies mass in neck, dysphagia, dyspepsia, persistent hoarseness, abdominal pain, or N/V/C  Next OV will complete fasting labs and IC to obtain accurate RMR She would like to continue GLP-1/GIP therapy indefinetly for control of BP, HLD, NAFLD, healthy weight, and remain off ETOH   01/06/24 16:00  Weight 148 lb (67.1 kg)  BMI (Calculated) 26.22  Total Weight Loss (lbs) 53 lb (24 kg)   Subjective:   1. Gastroesophageal reflux disease, unspecified whether esophagitis present GERD is well controlled with DAILY Prilosec 20mg  She is strength training (bone health)  2. Vitamin D  deficiency  Latest Reference Range & Units 08/21/22 11:54 02/16/23 09:17 07/22/23 08:40  Vitamin D , 25-Hydroxy 30.0 - 100.0 ng/mL 60.1 53.4 41.1   Vit D Level stable, just below goal of 50-70  3. Insulin   resistance  Latest Reference Range & Units 08/21/22 11:54 02/16/23 09:17 07/22/23 08:40  INSULIN  2.6 - 24.9 uIU/mL 15.2 29.9 (H) 14.8  (H): Data is abnormally high  She is on daily Metformin  500mg  and weekly max dose Zepbound  15mg  Denies mass in neck, dysphagia, dyspepsia, persistent hoarseness, abdominal pain, or N/V/C   4. Prediabetes Lab Results  Component Value Date   HGBA1C 5.4 07/22/2023   HGBA1C 5.5 02/16/2023   HGBA1C 5.8 (H) 08/21/2022   She is on daily Metformin  500mg  and weekly max dose Zepbound  15mg  Denies mass in neck, dysphagia, dyspepsia, persistent hoarseness, abdominal pain, or N/V/C   5.HTN PCP has been managing Irbesartan  150mg  She has been taking 1/2 tab daily and BP continues to be trending down She denies sx's of hypotension Discussed how Zepbound  helps to lower BP outside of weigh loss  Assessment/Plan:   1. Gastroesophageal reflux disease, unspecified whether esophagitis present (Primary) Refill - omeprazole  (PRILOSEC) 20 MG capsule; Take 1 capsule (20 mg total) by mouth daily.  Dispense: 90 capsule; Refill: 0  2. Vitamin D  deficiency Check Labs at next OV  3. Insulin  resistance Check Labs at next OV Refill  metFORMIN  (GLUCOPHAGE ) 500 MG tablet 1 tab with meal daily 500mg  total dose per day Dispense: 90 tablet, Refills: 0 ordered   4. Prediabetes Check Labs at next OV Refill  metFORMIN  (GLUCOPHAGE ) 500 MG tablet 1 tab with meal daily 500mg  total dose per day Dispense: 90 tablet, Refills: 0  ordered   5. HTN Refill and DECREASE irbesartan  (AVAPRO ) 75 MG tablet TAKE 1/2 TABLET BY MOUTH EVERY DAY Dispense: 30 tablet, Refills: 0 ordered   Closely monitor home BP and for sx's of hypotension  6. Obesity with current BMI of 26.2  Keidra is currently in the action stage of change. As such, her goal is to maintain weight for now. She has agreed to the Category 2 Plan.   Exercise goals: For substantial health benefits, adults should do at least 150  minutes (2 hours and 30 minutes) a week of moderate-intensity, or 75 minutes (1 hour and 15 minutes) a week of vigorous-intensity aerobic physical activity, or an equivalent combination of moderate- and vigorous-intensity aerobic activity. Aerobic activity should be performed in episodes of at least 10 minutes, and preferably, it should be spread throughout the week.  Behavioral modification strategies: increasing lean protein intake, decreasing simple carbohydrates, increasing vegetables, increasing water  intake, no skipping meals, meal planning and cooking strategies, keeping healthy foods in the home, ways to avoid boredom eating, and planning for success.  Lorrena has agreed to follow-up with our clinic in 4 weeks. She was informed of the importance of frequent follow-up visits to maximize her success with intensive lifestyle modifications for her multiple health conditions.   Check Fasting Labs and IC at next OV- pt aware to arrive 30 mins early and to be fasting Consider starting MX Phase at next OV  Objective:   Blood pressure 103/68, pulse 94, temperature 98.2 F (36.8 C), height 5' 3 (1.6 m), weight 148 lb (67.1 kg), SpO2 98%. Body mass index is 26.22 kg/m.  General: Cooperative, alert, well developed, in no acute distress. HEENT: Conjunctivae and lids unremarkable. Cardiovascular: Regular rhythm.  Lungs: Normal work of breathing. Neurologic: No focal deficits.   Lab Results  Component Value Date   CREATININE 0.82 07/22/2023   BUN 10 07/22/2023   NA 141 07/22/2023   K 4.2 07/22/2023   CL 101 07/22/2023   CO2 20 07/22/2023   Lab Results  Component Value Date   ALT 56 (H) 07/22/2023   AST 27 07/22/2023   ALKPHOS 109 07/22/2023   BILITOT 0.7 07/22/2023   Lab Results  Component Value Date   HGBA1C 5.4 07/22/2023   HGBA1C 5.5 02/16/2023   HGBA1C 5.8 (H) 08/21/2022   HGBA1C 5.0 10/18/2021   HGBA1C 5.3 05/14/2021   Lab Results  Component Value Date   INSULIN  14.8  07/22/2023   INSULIN  29.9 (H) 02/16/2023   INSULIN  15.2 08/21/2022   INSULIN  6.8 05/14/2021   INSULIN  12.5 01/23/2021   Lab Results  Component Value Date   TSH 1.87 10/18/2021   Lab Results  Component Value Date   CHOL 233 (H) 07/22/2023   HDL 50 07/22/2023   LDLCALC 154 (H) 07/22/2023   TRIG 158 (H) 07/22/2023   CHOLHDL 4.7 (H) 07/22/2023   Lab Results  Component Value Date   VD25OH 41.1 07/22/2023   VD25OH 53.4 02/16/2023   VD25OH 60.1 08/21/2022   Lab Results  Component Value Date   WBC 9.5 02/06/2023   HGB 14.2 02/06/2023   HCT 44 02/06/2023   MCV 85.5 10/06/2022   PLT 371 10/06/2022   Lab Results  Component Value Date   IRON 94 05/14/2021   TIBC 539 (H) 05/14/2021   FERRITIN 21 05/14/2021   Attestation Statements:   Reviewed by clinician on day of visit: allergies, medications, problem list, medical history, surgical history, family history, social history, and previous encounter  notes.  I have reviewed the above documentation for accuracy and completeness, and I agree with the above. -  Jaysun Wessels d. Dimond Crotty, NP-C

## 2024-01-10 ENCOUNTER — Encounter (HOSPITAL_BASED_OUTPATIENT_CLINIC_OR_DEPARTMENT_OTHER): Payer: Self-pay

## 2024-01-10 ENCOUNTER — Emergency Department (HOSPITAL_BASED_OUTPATIENT_CLINIC_OR_DEPARTMENT_OTHER)

## 2024-01-10 ENCOUNTER — Emergency Department (HOSPITAL_BASED_OUTPATIENT_CLINIC_OR_DEPARTMENT_OTHER)
Admission: EM | Admit: 2024-01-10 | Discharge: 2024-01-10 | Disposition: A | Discharge status: Home / Self Care | Attending: Emergency Medicine | Admitting: Emergency Medicine

## 2024-01-10 ENCOUNTER — Other Ambulatory Visit: Payer: Self-pay

## 2024-01-10 DIAGNOSIS — N200 Calculus of kidney: Secondary | ICD-10-CM | POA: Insufficient documentation

## 2024-01-10 DIAGNOSIS — R3915 Urgency of urination: Secondary | ICD-10-CM | POA: Diagnosis not present

## 2024-01-10 DIAGNOSIS — R109 Unspecified abdominal pain: Secondary | ICD-10-CM | POA: Diagnosis not present

## 2024-01-10 DIAGNOSIS — I10 Essential (primary) hypertension: Secondary | ICD-10-CM | POA: Diagnosis not present

## 2024-01-10 DIAGNOSIS — R3129 Other microscopic hematuria: Secondary | ICD-10-CM | POA: Diagnosis not present

## 2024-01-10 DIAGNOSIS — N201 Calculus of ureter: Secondary | ICD-10-CM | POA: Diagnosis not present

## 2024-01-10 DIAGNOSIS — Z79899 Other long term (current) drug therapy: Secondary | ICD-10-CM | POA: Insufficient documentation

## 2024-01-10 LAB — URINALYSIS, MICROSCOPIC (REFLEX): RBC / HPF: >50 RBC/hpf (ref 0–5)

## 2024-01-10 LAB — CBC WITH DIFFERENTIAL/PLATELET
Abs Immature Granulocytes: 0.04 K/uL (ref 0.00–0.07)
Basophils Absolute: 0 K/uL (ref 0.0–0.1)
Basophils Relative: 0 %
Eosinophils Absolute: 0 K/uL (ref 0.0–0.5)
Eosinophils Relative: 0 %
HCT: 44.8 % (ref 36.0–46.0)
Hemoglobin: 14.9 g/dL (ref 12.0–15.0)
Immature Granulocytes: 0 %
Lymphocytes Relative: 32 %
Lymphs Abs: 3.3 K/uL (ref 0.7–4.0)
MCH: 27 pg (ref 26.0–34.0)
MCHC: 33.3 g/dL (ref 30.0–36.0)
MCV: 81.2 fL (ref 80.0–100.0)
Monocytes Absolute: 0.6 K/uL (ref 0.1–1.0)
Monocytes Relative: 6 %
Neutro Abs: 6.4 K/uL (ref 1.7–7.7)
Neutrophils Relative %: 62 %
Platelets: 364 K/uL (ref 150–400)
RBC: 5.52 MIL/uL — ABNORMAL HIGH (ref 3.87–5.11)
RDW: 13.5 % (ref 11.5–15.5)
WBC: 10.4 K/uL (ref 4.0–10.5)
nRBC: 0 % (ref 0.0–0.2)

## 2024-01-10 LAB — COMPREHENSIVE METABOLIC PANEL WITH GFR
ALT: 36 U/L (ref 0–44)
AST: 23 U/L (ref 15–41)
Albumin: 4.6 g/dL (ref 3.5–5.0)
Alkaline Phosphatase: 88 U/L (ref 38–126)
Anion gap: 15 (ref 5–15)
BUN: 9 mg/dL (ref 6–20)
CO2: 23 mmol/L (ref 22–32)
Calcium: 9.8 mg/dL (ref 8.9–10.3)
Chloride: 103 mmol/L (ref 98–111)
Creatinine, Ser: 0.78 mg/dL (ref 0.44–1.00)
GFR, Estimated: >60 mL/min (ref >60)
Glucose, Bld: 92 mg/dL (ref 70–99)
Potassium: 4 mmol/L (ref 3.5–5.1)
Sodium: 140 mmol/L (ref 135–145)
Total Bilirubin: 0.4 mg/dL (ref 0.0–1.2)
Total Protein: 7.5 g/dL (ref 6.5–8.1)

## 2024-01-10 LAB — URINALYSIS, ROUTINE W REFLEX MICROSCOPIC
Glucose, UA: NEGATIVE mg/dL
Ketones, ur: NEGATIVE mg/dL
Leukocytes,Ua: NEGATIVE
Nitrite: NEGATIVE
Protein, ur: 30 mg/dL — AB
Specific Gravity, Urine: 1.025 (ref 1.005–1.030)
pH: 6 (ref 5.0–8.0)

## 2024-01-10 LAB — PREGNANCY, URINE: Preg Test, Ur: NEGATIVE

## 2024-01-10 LAB — LIPASE, BLOOD: Lipase: 29 U/L (ref 11–51)

## 2024-01-10 MED ORDER — KETOROLAC TROMETHAMINE 15 MG/ML IJ SOLN
15.0000 mg | Freq: Once | INTRAMUSCULAR | Status: AC
  Administered 2024-01-10: 15 mg via INTRAVENOUS
  Filled 2024-01-10: qty 1

## 2024-01-10 MED ORDER — ONDANSETRON HCL 4 MG/2ML IJ SOLN
4.0000 mg | Freq: Once | INTRAMUSCULAR | Status: AC
  Administered 2024-01-10: 4 mg via INTRAVENOUS
  Filled 2024-01-10: qty 2

## 2024-01-10 MED ORDER — SODIUM CHLORIDE 0.9 % IV BOLUS
500.0000 mL | Freq: Once | INTRAVENOUS | Status: AC
  Administered 2024-01-10: 500 mL via INTRAVENOUS

## 2024-01-10 MED ORDER — MORPHINE SULFATE (PF) 4 MG/ML IV SOLN
4.0000 mg | Freq: Once | INTRAVENOUS | Status: AC
  Administered 2024-01-10: 4 mg via INTRAVENOUS
  Filled 2024-01-10: qty 1

## 2024-01-10 NOTE — ED Provider Notes (Signed)
 [place] [facility] Provider Note   CSN: [id number] Arrival date & time: 01/10/24  1151     Patient presents with: Flank Pain   Erin Flores is a 38 y.o. female.   Patient is a 38 year old female who presents with right flank pain.  She said it started during the night.  Is been pretty constant.  Its mostly in her right mid back area.  It is somewhat worse with movement.  She has had some nausea but no vomiting.  She does have some pressure with urination and frequency.  No fevers.  No history of similar symptoms in the past.  She went to urgent care and was sent here for further evaluation.       Prior to Admission medications   Medication Sig Start Date End Date Taking? Authorizing Provider  ALPRAZolam  (XANAX ) 1 MG tablet Take 1 mg by mouth 2 (two) times daily as needed. 01/27/21   [provider]  cyclobenzaprine (FLEXERIL) 5 MG tablet TAKE 1 TABLET BY MOUTH THREE TIMES A DAY AS NEEDED FOR 30 DAYS    [provider]  desvenlafaxine (PRISTIQ) 50 MG 24 hr tablet  05/14/22   [provider]  diclofenac (VOLTAREN) 75 MG EC tablet Take 75 mg by mouth 2 (two) times daily. 06/17/23   [provider]  Drospirenone (SLYND) 4 MG TABS Take by mouth.    [provider]  FLUoxetine (PROZAC) 10 MG capsule Take by mouth daily. 10/16/21   [provider]  irbesartan  (AVAPRO ) 75 MG tablet TAKE 1/2 TABLET BY MOUTH EVERY DAY 01/06/24   Danford, Katy D, NP  metFORMIN  (GLUCOPHAGE ) 500 MG tablet 1 tab with meal daily  500mg  total dose per day 01/06/24   Danford, Katy D, NP  omeprazole  (PRILOSEC) 20 MG capsule Take 1 capsule (20 mg total) by mouth daily. 01/06/24   Danford, Rockie D, NP  ondansetron  (ZOFRAN ) 8 MG tablet Take 1 tablet by mouth 2 (two) times daily.    [provider]  risankizumab-rzaa (SKYRIZI PEN) 150 MG/ML pen 150mg  Subcutaneous week 0, 4, then every 12weeks for 84 days 10/22/22   [provider]  tirzepatide  (ZEPBOUND ) 15 MG/0.5ML Pen Inject 15 mg into the skin once a week. 12/09/23   Danford, Rockie D, NP  UNABLE TO FIND 15 mg.    [provider]    Allergies: Dilaudid  [hydromorphone  hcl] and Simponi [golimumab]    Review of Systems  Constitutional:  Negative for chills, diaphoresis, fatigue and fever.  HENT:  Negative for congestion, rhinorrhea and sneezing.   Eyes: Negative.   Respiratory:  Negative for cough, chest tightness and shortness of breath.   Cardiovascular:  Negative for chest pain and leg swelling.  Gastrointestinal:  Positive for abdominal pain and nausea. Negative for blood in stool, diarrhea and vomiting.  Genitourinary:  Positive for flank pain, frequency and urgency. Negative for difficulty urinating and hematuria.  Musculoskeletal:  Positive for back pain. Negative for arthralgias.  Skin:  Negative for rash.  Neurological:  Negative for dizziness, speech difficulty, weakness, numbness and headaches.    Updated Vital Signs BP (!) 138/97 (BP Location: Right Arm)   Pulse 89   Temp 97.7 F (36.5 C)   Resp 20   Wt 65.8 kg   SpO2 100%   BMI 25.69 kg/m   Physical Exam Constitutional:      Appearance: She is well-developed.     Comments: Appears uncomfortable  HENT:  Head: Normocephalic and atraumatic.  Eyes:     Pupils: Pupils are equal, round, and reactive to light.  Cardiovascular:     Rate and Rhythm: Normal rate and regular rhythm.     Heart sounds: Normal heart sounds.  Pulmonary:     Effort: Pulmonary effort is normal. No respiratory distress.     Breath sounds: Normal breath sounds. No wheezing or rales.  Chest:     Chest wall: No tenderness.  Abdominal:     General: Bowel sounds are normal.     Palpations: Abdomen is soft.     Tenderness: There is abdominal tenderness (Tenderness to the right mid abdomen). There is no guarding or rebound.  Musculoskeletal:        General: Normal range of motion.     Cervical  back: Normal range of motion and neck supple.  Lymphadenopathy:     Cervical: No cervical adenopathy.  Skin:    General: Skin is warm and dry.     Findings: No rash.  Neurological:     Mental Status: She is alert and oriented to person, place, and time.     (all labs ordered are listed, but only abnormal results are displayed) Labs Reviewed  URINALYSIS, ROUTINE W REFLEX MICROSCOPIC - Abnormal; Notable for the following components:      Result Value   Hgb urine dipstick LARGE (*)    Bilirubin Urine SMALL (*)    Protein, ur 30 (*)    All other components within normal limits  CBC WITH DIFFERENTIAL/PLATELET - Abnormal; Notable for the following components:   RBC 5.52 (*)    All other components within normal limits  URINALYSIS, MICROSCOPIC (REFLEX) - Abnormal; Notable for the following components:   Bacteria, UA MANY (*)    All other components within normal limits  PREGNANCY, URINE  COMPREHENSIVE METABOLIC PANEL WITH GFR  LIPASE, BLOOD    EKG: None  Radiology: CT Renal Stone Study Result Date: 01/10/2024 CLINICAL DATA:  Right-sided flank pain, dysuria and nausea. EXAM: CT ABDOMEN AND PELVIS WITHOUT CONTRAST TECHNIQUE: Multidetector CT imaging of the abdomen and pelvis was performed following the standard protocol without IV contrast. RADIATION DOSE REDUCTION: This exam was performed according to the departmental dose-optimization program which includes automated exposure control, adjustment of the mA and/or kV according to patient size and/or use of iterative reconstruction technique. COMPARISON:  07/31/2021. FINDINGS: Lower chest: No acute findings. Heart size normal. No pericardial effusion. No pleural effusion. Distal esophagus is grossly unremarkable. Hepatobiliary: Liver is grossly unremarkable. Cholecystectomy. No biliary ductal dilatation. Pancreas: Negative. Spleen: Negative. Adrenals/Urinary Tract: Adrenal glands and kidneys are unremarkable. Trace prominence of the right  ureter with mild periureteric haziness secondary to a 2 mm stone at the right ureteral orifice (2/78). Tiny locules of air in the bladder are presumably iatrogenic in etiology. Left ureter is decompressed. Stomach/Bowel: Stomach, small bowel, appendix and colon are unremarkable. Vascular/Lymphatic: Vascular structures are unremarkable. No pathologically enlarged lymph nodes. Reproductive: Uterus is visualized.  No adnexal mass. Other: No free fluid. Musculoskeletal: None. IMPRESSION: 2 mm stone at the right ureteral orifice, with trace residual right ureteral prominence and mild periureteric haziness. Electronically Signed   By: Newell Eke M.D.   On: 01/10/2024 13:10     Procedures   Medications Ordered in the ED  morphine  (PF) 4 MG/ML injection 4 mg (4 mg Intravenous Given 01/10/24 1231)  ondansetron  (ZOFRAN ) injection 4 mg (4 mg Intravenous Given 01/10/24 1230)  sodium chloride  0.9 % bolus 500  mL (0 mLs Intravenous Stopped 01/10/24 1350)  ketorolac  (TORADOL ) 15 MG/ML injection 15 mg (15 mg Intravenous Given 01/10/24 1333)                                    Medical Decision Making Amount and/or Complexity of Data Reviewed Labs: ordered. Radiology: ordered.  Risk Prescription drug management.   This patient presents to the ED for concern of flank pain, this involves an extensive number of treatment options, and is a complaint that carries with it a high risk of complications and morbidity.  I considered the following differential and admission for this acute, potentially life threatening condition.  The differential diagnosis includes kidney stone, pyelonephritis, cholecystitis, appendicitis, bowel obstruction, colitis  MDM:    Patient is a 38 year old who presents with pain in her right flank and mid abdomen.  Her urine does have hematuria but no signs of infection.  CT scan shows a 2 mm stone at the distal right ureter just at the opening of the bladder.  She was given pain  medications here and is feeling much better.  Her pain is actually pretty much resolved.  Suspect she has passed the stone as it look like it was just passiing into the bladder on the CT scan.  Her other labs are nonconcerning.  She was discharged home in good condition.  She did decline the need for any pain medication.  Advised her to follow-up with [facility] if she has any ongoing symptoms.  Return precautions were given.  (Labs, imaging, consults)  Labs: I Ordered, and personally interpreted labs.  The pertinent results include: Normal creatinine, hematuria without signs of infection  Imaging Studies ordered: I ordered imaging studies including CT abdomen pelvis I independently visualized and interpreted imaging. I agree with the radiologist interpretation  Additional history obtained from chart.  External records from outside source obtained and reviewed including urgent care labs  Cardiac Monitoring: The patient was maintained on a cardiac monitor.  If on the cardiac monitor, I personally viewed and interpreted the cardiac monitored which showed an underlying rhythm of: Sinus rhythm  Reevaluation: After the interventions noted above, I reevaluated the patient and found that they have :improved  Social Determinants of Health:    Disposition: Discharged to home  Co morbidities that complicate the patient evaluation  Past Medical History:  Diagnosis Date   Anemia    Anxiety and depression    Attention deficit disorder (ADD)    Bilateral lower extremity edema 2020/2021   Bipolar affective disorder (HCC)    question of:  pt saw psychiatrist and was on lamictal and tegretol aroun 2018; takes clonoprin 2mg  prn for social anxiety.  he majory sx's were in the context of signif psychsocial probs with the father of her daughter.   Chest pain    Dizziness    outpt rhythm monitoring NL 10/2021   Duane syndrome of left eye    GERD (gastroesophageal reflux disease)    Herpes  zoster 2017   History of iron deficiency anemia    History of pyelonephritis 2018   Hypertension    Migraines    I take a lot of ibuprofen.  Worse on OCPs.  Gets 8 tension HA's a month and 1-2 migraines per month.   Psoriatic arthritis (HCC) 2014 dx   Enbrel 2020. Simponi 2021->?htn and edema rx?->stopped by Dr. Mai 12/2019.  Cosentyx initiated 2021/2022  Medicines Meds ordered this encounter  Medications   morphine  (PF) 4 MG/ML injection 4 mg   ondansetron  (ZOFRAN ) injection 4 mg   sodium chloride  0.9 % bolus 500 mL   ketorolac  (TORADOL ) 15 MG/ML injection 15 mg    I have reviewed the patients home medicines and have made adjustments as needed  Problem List / ED Course: Problem List Items Addressed This Visit   None Visit Diagnoses       Kidney stone    -  Primary   Relevant Medications   morphine  (PF) 4 MG/ML injection 4 mg (Completed)                Final diagnoses:  Kidney stone    ED Discharge Orders     None          Lenor Hollering, MD 01/10/24 1354

## 2024-01-10 NOTE — ED Triage Notes (Signed)
 Reports R sided flank pain, dysuria, nausea since last night. Sent by UC for possible kidney stone.

## 2024-01-10 NOTE — Discharge Instructions (Signed)
 Follow-up with your urologist if you have any ongoing symptoms.  Return to emergency room if you have any worsening symptoms.

## 2024-01-10 NOTE — ED Notes (Signed)
 Pt made aware that her family could come back to her room.

## 2024-01-10 NOTE — ED Notes (Signed)
 Pt in CT scan.

## 2024-02-03 ENCOUNTER — Encounter (INDEPENDENT_AMBULATORY_CARE_PROVIDER_SITE_OTHER): Payer: Self-pay | Admitting: Adult Health

## 2024-02-03 ENCOUNTER — Ambulatory Visit (INDEPENDENT_AMBULATORY_CARE_PROVIDER_SITE_OTHER): Admitting: Adult Health

## 2024-02-03 VITALS — BP 105/73 | HR 103 | Temp 98.1°F | Ht 63.0 in | Wt 140.0 lb

## 2024-02-03 DIAGNOSIS — R7303 Prediabetes: Secondary | ICD-10-CM

## 2024-02-03 DIAGNOSIS — E66812 Obesity, class 2: Secondary | ICD-10-CM

## 2024-02-03 DIAGNOSIS — E782 Mixed hyperlipidemia: Secondary | ICD-10-CM | POA: Diagnosis not present

## 2024-02-03 DIAGNOSIS — N200 Calculus of kidney: Secondary | ICD-10-CM | POA: Diagnosis not present

## 2024-02-03 DIAGNOSIS — E559 Vitamin D deficiency, unspecified: Secondary | ICD-10-CM

## 2024-02-03 DIAGNOSIS — K760 Fatty (change of) liver, not elsewhere classified: Secondary | ICD-10-CM | POA: Diagnosis not present

## 2024-02-03 DIAGNOSIS — I10 Essential (primary) hypertension: Secondary | ICD-10-CM

## 2024-02-03 DIAGNOSIS — Z6824 Body mass index (BMI) 24.0-24.9, adult: Secondary | ICD-10-CM

## 2024-02-03 DIAGNOSIS — E669 Obesity, unspecified: Secondary | ICD-10-CM

## 2024-02-03 MED ORDER — ZEPBOUND 15 MG/0.5ML ~~LOC~~ SOAJ: 15.0000 mg | SUBCUTANEOUS | 0 refills | Status: DC

## 2024-02-03 MED ORDER — METFORMIN HCL 500 MG PO TABS: ORAL_TABLET | ORAL | 0 refills | Status: DC

## 2024-02-03 NOTE — Progress Notes (Signed)
 WEIGHT SUMMARY AND BIOMETRICS  Vitals Temp: 98.1 F (36.7 C) BP: 105/73 Pulse Rate: (!) 103 SpO2: 98 %   Anthropometric Measurements Height: 5' 3 (1.6 m) Weight: 140 lb (63.5 kg) BMI (Calculated): 24.81 Weight at Last Visit: 148lb Weight Lost Since Last Visit: 8lb Weight Gained Since Last Visit: 0lb Starting Weight: 201lb Total Weight Loss (lbs): 61 lb (27.7 kg) Peak Weight: 243lb   Body Composition  Body Fat %: 34.4 % Fat Mass (lbs): 48.2 lbs Muscle Mass (lbs): 87.2 lbs Total Body Water  (lbs): 62.6 lbs Visceral Fat Rating : 5   Other Clinical Data Fasting: yes Labs: Yes Today's Visit #: 30 Starting Date: 01/23/21    Chief Complaint:   OBESITY Erin Flores is here to discuss her progress with her obesity treatment plan.  She is on the the Category 2 Plan and states she is following her eating plan approximately 90 % of the time.  She states she is exercising 60 minutes 4 times per week.  Interim History:  01/10/2024- seen at [facility] then referred to ED for acute R flank pain- Kidney Stone  She started on weekly Zepbound  therapy May 2024 She is currently on weekly Zebbound 15mg - tolerating well. She would like to continue GLP-1/GIP therapy indefinetly for control of BP, HLD, NAFLD, healthy weight, and remain off ETOH  Her daughter gave birth to a healthy baby girl! Mother and baby are doing well. The baby's father proposed to her daughter over the weekend!  Consider starting Mx Phase at next OV  Subjective:   1. Kidney stone 01/10/2024 [place] Encounter Notes: Patient presents with: Flank Pain MARJORIA Flores is a 38 y.o. female. Patient is a 38 year old female who presents with right flank pain.  She said it started during the night.  Is been pretty constant.  Its mostly in her right mid back area.  It is somewhat worse with movement.  She has had some nausea but no vomiting.  She does have some pressure with urination and frequency.  No fevers.  No  history of similar symptoms in the past.  She went to urgent care and was sent here for further evaluation. Risk Prescription drug management. This patient presents to the ED for concern of flank pain, this involves an extensive number of treatment options, and is a complaint that carries with it a high risk of complications and morbidity.  I considered the following differential and admission for this acute, potentially life threatening condition.  The differential diagnosis includes kidney stone, pyelonephritis, cholecystitis, appendicitis, bowel obstruction, colitis MDM:   Patient is a 38 year old who presents with pain in her right flank and mid abdomen.  Her urine does have hematuria but no signs of infection.  CT scan shows a 2 mm stone at the distal right ureter just at the opening of the bladder.  She was given pain medications here and is feeling much better.  Her pain is actually pretty much resolved.  Suspect she has passed the stone as it look like it was just passiing into the bladder on the CT scan.  Her other labs are nonconcerning.  She was discharged home in good condition.  She did decline the need for any pain medication.  Advised her to follow-up with [facility] if she has any ongoing symptoms.  Return precautions were given. Labs: I Ordered, and personally interpreted labs.  The pertinent results include: Normal creatinine, hematuria without signs of infection Imaging Studies ordered: I ordered imaging studies  including CT abdomen pelvis I independently visualized and interpreted imaging. I agree with the radiologist interpretation  Additional history obtained from chart.  External records from outside source obtained and reviewed including urgent care labs  Cardiac Monitoring: The patient was maintained on a cardiac monitor.  If on the cardiac monitor, I personally viewed and interpreted the cardiac monitored which showed an underlying rhythm of: Sinus  rhythm Reevaluation: After the interventions noted above, I reevaluated the patient and found that they have :improved  2. Prediabetes She started on weekly Zepbound  therapy May 2024 She is currently on weekly Zebbound 15mg - tolerating well. She would like to continue GLP-1/GIP therapy indefinetly for control of BP, HLD, NAFLD, healthy weight, and remain off ETOH She is on also on daily Metformin  500mg   3. Vitamin D  deficiency  Latest Reference Range & Units 01/23/21 09:20  Vitamin D , 25-Hydroxy 30.0 - 100.0 ng/mL 51.0   She endorses stable energy levels  4. NAFLD (nonalcoholic fatty liver disease) She started on weekly Zepbound  therapy May 2024 She is currently on weekly Zebbound 15mg - tolerating well. She would like to continue GLP-1/GIP therapy indefinetly for control of BP, HLD, NAFLD, healthy weight, and remain off ETOH  01/10/2024 CMP- Liver enzymes have normalized!!  5. Essential hypertension She started on weekly Zepbound  therapy May 2024 She is currently on weekly Zebbound 15mg - tolerating well. She would like to continue GLP-1/GIP therapy indefinetly for control of BP, HLD, NAFLD, healthy weight, and remain off ETOH BP has been trending down and we have been weaning down her ARB She is currently taking Irbesartan  75mg  1/2 tab daily  6. Mixed hyperlipidemia She started on weekly Zepbound  therapy May 2024 She is currently on weekly Zebbound 15mg - tolerating well. She would like to continue GLP-1/GIP therapy indefinetly for control of BP, HLD, NAFLD, healthy weight, and remain off ETOH  Assessment/Plan:   1. Kidney stone Continue excellent hydration and monitor for sx's  2. Prediabetes (Primary) Check Labs - Hemoglobin A1c - Insulin , random  3. Vitamin D  deficiency Check Labs - VITAMIN D  25 Hydroxy (Vit-D Deficiency, Fractures)  4. NAFLD (nonalcoholic fatty liver disease) Check Labs - Lipid panel  5. Essential hypertension Closely monitor ambulatory  BP Continue healthy eating and regular exercise Take Irbesartan  75mg  1/2 tab EVERY OTHER DAY Remain will hydrated and monitor home BP- bring log to next OV  6. Mixed hyperlipidemia Check Labs - Lipid panel  7. Obesity with current BMI of 24.8  Hydie is currently in the action stage of change. As such, her goal is to maintain weight for now. She has agreed to the Category 2 Plan.   Exercise goals: For substantial health benefits, adults should do at least 150 minutes (2 hours and 30 minutes) a week of moderate-intensity, or 75 minutes (1 hour and 15 minutes) a week of vigorous-intensity aerobic physical activity, or an equivalent combination of moderate- and vigorous-intensity aerobic activity. Aerobic activity should be performed in episodes of at least 10 minutes, and preferably, it should be spread throughout the week.  Behavioral modification strategies: increasing lean protein intake, decreasing simple carbohydrates, increasing vegetables, increasing water  intake, meal planning and cooking strategies, keeping healthy foods in the home, ways to avoid boredom eating, and planning for success.  Austyn has agreed to follow-up with our clinic in 4 weeks. She was informed of the importance of frequent follow-up visits to maximize her success with intensive lifestyle modifications for her multiple health conditions.   Ariele was informed we would discuss her  lab results at her next visit unless there is a critical issue that needs to be addressed sooner. Nera agreed to keep her next visit at the agreed upon time to discuss these results.  Check IC at next OV- pt aware to arrive 30 mins prior to appt and to be fasting. Consider starting Mx Phase at next OV  Objective:   Blood pressure 105/73, pulse (!) 103, temperature 98.1 F (36.7 C), height 5' 3 (1.6 m), weight 140 lb (63.5 kg), SpO2 98%. Body mass index is 24.8 kg/m.  General: Cooperative, alert, well developed, in no acute  distress. HEENT: Conjunctivae and lids unremarkable. Cardiovascular: Regular rhythm.  Lungs: Normal work of breathing. Neurologic: No focal deficits.   Lab Results  Component Value Date   CREATININE 0.78 01/10/2024   BUN 9 01/10/2024   NA 140 01/10/2024   K 4.0 01/10/2024   CL 103 01/10/2024   CO2 23 01/10/2024   Lab Results  Component Value Date   ALT 36 01/10/2024   AST 23 01/10/2024   ALKPHOS 88 01/10/2024   BILITOT 0.4 01/10/2024   Lab Results  Component Value Date   HGBA1C 5.4 07/22/2023   HGBA1C 5.5 02/16/2023   HGBA1C 5.8 (H) 08/21/2022   HGBA1C 5.0 10/18/2021   HGBA1C 5.3 05/14/2021   Lab Results  Component Value Date   INSULIN  14.8 07/22/2023   INSULIN  29.9 (H) 02/16/2023   INSULIN  15.2 08/21/2022   INSULIN  6.8 05/14/2021   INSULIN  12.5 01/23/2021   Lab Results  Component Value Date   TSH 1.87 10/18/2021   Lab Results  Component Value Date   CHOL 233 (H) 07/22/2023   HDL 50 07/22/2023   LDLCALC 154 (H) 07/22/2023   TRIG 158 (H) 07/22/2023   CHOLHDL 4.7 (H) 07/22/2023   Lab Results  Component Value Date   VD25OH 41.1 07/22/2023   VD25OH 53.4 02/16/2023   VD25OH 60.1 08/21/2022   Lab Results  Component Value Date   WBC 10.4 01/10/2024   HGB 14.9 01/10/2024   HCT 44.8 01/10/2024   MCV 81.2 01/10/2024   PLT 364 01/10/2024   Lab Results  Component Value Date   IRON 94 05/14/2021   TIBC 539 (H) 05/14/2021   FERRITIN 21 05/14/2021   Attestation Statements:   Reviewed by clinician on day of visit: allergies, medications, problem list, medical history, surgical history, family history, social history, and previous encounter notes.  I have reviewed the above documentation for accuracy and completeness, and I agree with the above. -  Yani Cov[employer] d. Ileanna Gemmill, NP-C

## 2024-02-04 ENCOUNTER — Other Ambulatory Visit (INDEPENDENT_AMBULATORY_CARE_PROVIDER_SITE_OTHER): Payer: Self-pay | Admitting: Adult Health

## 2024-02-05 ENCOUNTER — Encounter (INDEPENDENT_AMBULATORY_CARE_PROVIDER_SITE_OTHER): Payer: Self-pay | Admitting: Adult Health

## 2024-02-05 LAB — LIPID PANEL
Chol/HDL Ratio: 4.7 ratio — ABNORMAL HIGH (ref 0.0–4.4)
Cholesterol, Total: 232 mg/dL — ABNORMAL HIGH (ref 100–199)
HDL: 49 mg/dL (ref >39)
LDL Chol Calc (NIH): 164 mg/dL — ABNORMAL HIGH (ref 0–99)
Triglycerides: 104 mg/dL (ref 0–149)
VLDL Cholesterol Cal: 19 mg/dL (ref 5–40)

## 2024-02-05 LAB — INSULIN, RANDOM: INSULIN: 9.3 u[IU]/mL (ref 2.6–24.9)

## 2024-02-05 LAB — VITAMIN D 25 HYDROXY (VIT D DEFICIENCY, FRACTURES): Vit D, 25-Hydroxy: 53.2 ng/mL (ref 30.0–100.0)

## 2024-02-05 LAB — HEMOGLOBIN A1C
Est. average glucose Bld gHb Est-mCnc: 105 mg/dL
Hgb A1c MFr Bld: 5.3 % (ref 4.8–5.6)

## 2024-02-05 MED ORDER — METFORMIN HCL 500 MG PO TABS: ORAL_TABLET | ORAL | 0 refills | Status: DC

## 2024-02-08 DIAGNOSIS — L4059 Other psoriatic arthropathy: Secondary | ICD-10-CM | POA: Diagnosis not present

## 2024-02-08 DIAGNOSIS — Z111 Encounter for screening for respiratory tuberculosis: Secondary | ICD-10-CM | POA: Diagnosis not present

## 2024-02-08 DIAGNOSIS — L400 Psoriasis vulgaris: Secondary | ICD-10-CM | POA: Diagnosis not present

## 2024-02-08 DIAGNOSIS — Z79899 Other long term (current) drug therapy: Secondary | ICD-10-CM | POA: Diagnosis not present

## 2024-02-08 DIAGNOSIS — M5442 Lumbago with sciatica, left side: Secondary | ICD-10-CM | POA: Diagnosis not present

## 2024-03-02 ENCOUNTER — Encounter (INDEPENDENT_AMBULATORY_CARE_PROVIDER_SITE_OTHER): Payer: Self-pay | Admitting: Adult Health

## 2024-03-02 ENCOUNTER — Ambulatory Visit (INDEPENDENT_AMBULATORY_CARE_PROVIDER_SITE_OTHER): Admitting: Adult Health

## 2024-03-02 VITALS — BP 132/89 | HR 96 | Temp 98.2°F | Ht 63.0 in | Wt 140.0 lb

## 2024-03-02 DIAGNOSIS — E559 Vitamin D deficiency, unspecified: Secondary | ICD-10-CM

## 2024-03-02 DIAGNOSIS — E669 Obesity, unspecified: Secondary | ICD-10-CM

## 2024-03-02 DIAGNOSIS — R0602 Shortness of breath: Secondary | ICD-10-CM

## 2024-03-02 DIAGNOSIS — K760 Fatty (change of) liver, not elsewhere classified: Secondary | ICD-10-CM

## 2024-03-02 DIAGNOSIS — I10 Essential (primary) hypertension: Secondary | ICD-10-CM | POA: Diagnosis not present

## 2024-03-02 DIAGNOSIS — R7303 Prediabetes: Secondary | ICD-10-CM

## 2024-03-02 DIAGNOSIS — Z6824 Body mass index (BMI) 24.0-24.9, adult: Secondary | ICD-10-CM

## 2024-03-02 DIAGNOSIS — E88819 Insulin resistance, unspecified: Secondary | ICD-10-CM

## 2024-03-02 DIAGNOSIS — E782 Mixed hyperlipidemia: Secondary | ICD-10-CM

## 2024-03-02 MED ORDER — IRBESARTAN 75 MG PO TABS: ORAL_TABLET | ORAL | 0 refills | Status: DC

## 2024-03-02 MED ORDER — ZEPBOUND 15 MG/0.5ML ~~LOC~~ SOAJ: 15.0000 mg | SUBCUTANEOUS | 0 refills | Status: DC

## 2024-03-02 NOTE — Progress Notes (Signed)
 WEIGHT SUMMARY AND BIOMETRICS  Vitals Temp: 98.2 F (36.8 C) BP: 132/89 Pulse Rate: 96 SpO2: 99 %   Anthropometric Measurements Height: 5' 3 (1.6 m) Weight: 140 lb (63.5 kg) BMI (Calculated): 24.81 Weight at Last Visit: 140lb Weight Lost Since Last Visit: 0lb Weight Gained Since Last Visit: 0lb Starting Weight: 201lb Total Weight Loss (lbs): 61 lb (27.7 kg) Peak Weight: 243lb   Body Composition  Body Fat %: 35.5 % Fat Mass (lbs): 50 lbs Muscle Mass (lbs): 86.2 lbs Total Body Water  (lbs): 64.4 lbs Visceral Fat Rating : 6   Other Clinical Data RMR: 1152 Fasting: Yes Labs: Yes Today's Visit #: 31 Starting Date: 01/23/21    Chief Complaint:   OBESITY Erin Flores is here to discuss her progress with her obesity treatment plan.  She is on the the Category 2 Plan and states she is following her eating plan approximately 70-80 % of the time.  She states she is exercising Walking 20-30 minutes 5 times per week.  Interim History:  Per pt- her highest recorded weight was 240 lbs   01/23/21 08:00  Height 5' 3 (1.6 m)  Weight 201 lb (91.2 kg)  BMI (Calculated) 35.61    01/23/21 08:00  RMR 1440    03/02/24 06:00  Height 5' 3 (1.6 m)  Weight 140 lb (63.5 kg)  BMI (Calculated) 24.81  Total Weight Loss (lbs) 61 lb (27.7 kg)    03/02/24 06:00  RMR 1152   Reviewed synposis data and trends over the last 3 years- she is agreeable to being Mx Phase at next OV  She is on Zepbound  15mg  injection, administers Q10-12days Denies mass in neck, dysphagia, dyspepsia, persistent hoarseness, abdominal pain, or N/V/C   Subjective:   1. Insulin  resistance Discussed Labs  Fantastic reduction of insulin  level, just above goal of 5 She is on Zepbound  15mg  injection, administers Q10-12days   Latest Reference Range & Units 02/03/24 07:54  Hemoglobin A1C 4.8 - 5.6 % 5.3  Est. average glucose Bld gHb Est-mCnc mg/dL 894  INSULIN  2.6 - 24.9 uIU/mL 9.3   2.  Prediabetes Discussed Labs  A1c at goal Fantastic reduction of insulin  level, just above goal of 5 She is on Zepbound  15mg  injection, administers Q10-12days   Latest Reference Range & Units 02/03/24 07:54  Hemoglobin A1C 4.8 - 5.6 % 5.3  Est. average glucose Bld gHb Est-mCnc mg/dL 894  INSULIN  2.6 - 24.9 uIU/mL 9.3   3. NAFLD (nonalcoholic fatty liver disease) Discussed Labs  Liver enzymes are normal She denies RUQ pain She continues to abstain for ETOH   Latest Reference Range & Units 01/10/24 12:35  Alkaline Phosphatase 38 - 126 U/L 88  Albumin 3.5 - 5.0 g/dL 4.6  Lipase 11 - 51 U/L 29  AST 15 - 41 U/L 23  ALT 0 - 44 U/L 36   4. Essential hypertension Discussed Labs August 2025 CMP- electrolytes, kidney fx, and liver enzymes are normal  5. Vitamin D  deficiency Discussed Labs  Vit D Level at goal She is not on any Vit D supplementation   Latest Reference Range & Units 02/03/24 07:54  Vitamin D , 25-Hydroxy 30.0 - 100.0 ng/mL 53.2   6. Mixed hyperlipidemia Discussed Labs  Total, LDL both worsened and are above goal She is not on statin therapy   Lipid Panel     Component Value Date/Time   CHOL 232 (H) 02/03/2024 0754   TRIG 104 02/03/2024 0754   HDL 49 02/03/2024 0754  CHOLHDL 4.7 (H) 02/03/2024 0754   CHOLHDL 4 02/24/2020 0852   VLDL 37.0 02/24/2020 0852   LDLCALC 164 (H) 02/03/2024 0754   LABVLDL 19 02/03/2024 0754   The 10-year ASCVD risk score (Arnett DK, et al., 2019) is: 1.4%   Values used to calculate the score:     Age: 22 years     Clincally relevant sex: Female     Is Non-Hispanic African American: No     Diabetic: No     Tobacco smoker: No     Systolic Blood Pressure: 132 mmHg     Is BP treated: Yes     HDL Cholesterol: 49 mg/dL     Total Cholesterol: 232 mg/dL   She reports both of her parents have HLD treated with statin therapy She is on statin therapy She is on Skyrizi 150mg  injections, can increase Tgs and LDL  7. SOB (shortness  of breath) on exertion   01/23/21 08:00  RMR 1440   Aug 2022 metabolism faster than anticipated   03/02/24 06:00  RMR 1152   Oct 2025 metabolism reduced and now slower than anticipated  Assessment/Plan:   1. Insulin  resistance Refill tirzepatide  (ZEPBOUND ) 15 MG/0.5ML Pen Inject 15 mg into the skin once a week. Dispense: 6 mL, Refills: 0 ordered   2. Prediabetes (Primary) Refill tirzepatide  (ZEPBOUND ) 15 MG/0.5ML Pen Inject 15 mg into the skin once a week. Dispense: 6 mL, Refills: 0 ordered   3. NAFLD (nonalcoholic fatty liver disease) Refill tirzepatide  (ZEPBOUND ) 15 MG/0.5ML Pen Inject 15 mg into the skin once a week. Dispense: 6 mL, Refills: 0 ordered   4. Essential hypertension Refill- TAKE EVERY OTHER DAY  5. Vitamin D  deficiency Monitor Labs  6. Mixed hyperlipidemia Refill tirzepatide  (ZEPBOUND ) 15 MG/0.5ML Pen Inject 15 mg into the skin once a week. Dispense: 6 mL, Refills: 0 ordered   7. SOB (shortness of breath) on exertion Check IC To maintain current weight keep a food journal and adhering to recommended goals of 1150-1250 calories and 90g+ protein Continue regular exercise- cardiovascular and strength training  8. Obesity, Starting BMI 24.9 Refill tirzepatide  (ZEPBOUND ) 15 MG/0.5ML Pen Inject 15 mg into the skin once a week. Dispense: 6 mL, Refills: 0 ordered   Erin Flores is currently in the action stage of change. As such, her goal is to maintain weight for now. She has agreed to keeping a food journal and adhering to recommended goals of 1150-1250 calories and 90g+ protein.   Exercise goals: For substantial health benefits, adults should do at least 150 minutes (2 hours and 30 minutes) a week of moderate-intensity, or 75 minutes (1 hour and 15 minutes) a week of vigorous-intensity aerobic physical activity, or an equivalent combination of moderate- and vigorous-intensity aerobic activity. Aerobic activity should be performed in episodes of at least 10  minutes, and preferably, it should be spread throughout the week.  Behavioral modification strategies: increasing lean protein intake, decreasing simple carbohydrates, increasing vegetables, increasing water  intake, no skipping meals, meal planning and cooking strategies, keeping healthy foods in the home, ways to avoid boredom eating, and planning for success.  Raine has agreed to follow-up with our clinic in 4 weeks. She was informed of the importance of frequent follow-up visits to maximize her success with intensive lifestyle modifications for her multiple health conditions.   Objective:   Blood pressure 132/89, pulse 96, temperature 98.2 F (36.8 C), height 5' 3 (1.6 m), weight 140 lb (63.5 kg), SpO2 99%. Body mass index is 24.8  kg/m.  General: Cooperative, alert, well developed, in no acute distress. HEENT: Conjunctivae and lids unremarkable. Cardiovascular: Regular rhythm.  Lungs: Normal work of breathing. Neurologic: No focal deficits.   Lab Results  Component Value Date   CREATININE 0.78 01/10/2024   BUN 9 01/10/2024   NA 140 01/10/2024   K 4.0 01/10/2024   CL 103 01/10/2024   CO2 23 01/10/2024   Lab Results  Component Value Date   ALT 36 01/10/2024   AST 23 01/10/2024   ALKPHOS 88 01/10/2024   BILITOT 0.4 01/10/2024   Lab Results  Component Value Date   HGBA1C 5.3 02/03/2024   HGBA1C 5.4 07/22/2023   HGBA1C 5.5 02/16/2023   HGBA1C 5.8 (H) 08/21/2022   HGBA1C 5.0 10/18/2021   Lab Results  Component Value Date   INSULIN  9.3 02/03/2024   INSULIN  14.8 07/22/2023   INSULIN  29.9 (H) 02/16/2023   INSULIN  15.2 08/21/2022   INSULIN  6.8 05/14/2021   Lab Results  Component Value Date   TSH 1.87 10/18/2021   Lab Results  Component Value Date   CHOL 232 (H) 02/03/2024   HDL 49 02/03/2024   LDLCALC 164 (H) 02/03/2024   TRIG 104 02/03/2024   CHOLHDL 4.7 (H) 02/03/2024   Lab Results  Component Value Date   VD25OH 53.2 02/03/2024   VD25OH 41.1 07/22/2023    VD25OH 53.4 02/16/2023   Lab Results  Component Value Date   WBC 10.4 01/10/2024   HGB 14.9 01/10/2024   HCT 44.8 01/10/2024   MCV 81.2 01/10/2024   PLT 364 01/10/2024   Lab Results  Component Value Date   IRON 94 05/14/2021   TIBC 539 (H) 05/14/2021   FERRITIN 21 05/14/2021   Attestation Statements:   Reviewed by clinician on day of visit: allergies, medications, problem list, medical history, surgical history, family history, social history, and previous encounter notes.  I have reviewed the above documentation for accuracy and completeness, and I agree with the above. -  Briget Shaheed d. Murle Hellstrom, NP-C

## 2024-03-04 DIAGNOSIS — F319 Bipolar disorder, unspecified: Secondary | ICD-10-CM | POA: Diagnosis not present

## 2024-03-30 ENCOUNTER — Ambulatory Visit (INDEPENDENT_AMBULATORY_CARE_PROVIDER_SITE_OTHER): Admitting: Adult Health

## 2024-03-30 VITALS — BP 135/83 | HR 92 | Temp 98.1°F | Ht 63.0 in | Wt 139.0 lb

## 2024-03-30 DIAGNOSIS — E88819 Insulin resistance, unspecified: Secondary | ICD-10-CM | POA: Diagnosis not present

## 2024-03-30 DIAGNOSIS — I10 Essential (primary) hypertension: Secondary | ICD-10-CM | POA: Diagnosis not present

## 2024-03-30 DIAGNOSIS — E669 Obesity, unspecified: Secondary | ICD-10-CM | POA: Diagnosis not present

## 2024-03-30 DIAGNOSIS — K760 Fatty (change of) liver, not elsewhere classified: Secondary | ICD-10-CM

## 2024-03-30 DIAGNOSIS — Z6824 Body mass index (BMI) 24.0-24.9, adult: Secondary | ICD-10-CM

## 2024-03-30 DIAGNOSIS — Z6835 Body mass index (BMI) 35.0-35.9, adult: Secondary | ICD-10-CM

## 2024-03-30 DIAGNOSIS — R7303 Prediabetes: Secondary | ICD-10-CM

## 2024-03-30 MED ORDER — ZEPBOUND 15 MG/0.5ML ~~LOC~~ SOAJ: 15.0000 mg | SUBCUTANEOUS | 0 refills | Status: DC

## 2024-03-30 MED ORDER — METFORMIN HCL 500 MG PO TABS: ORAL_TABLET | ORAL | 0 refills | Status: DC

## 2024-03-30 NOTE — Progress Notes (Addendum)
 WEIGHT SUMMARY AND BIOMETRICS  Vitals Temp: 98.1 F (36.7 C) BP: 135/83 Pulse Rate: 92 SpO2: 99 %   Anthropometric Measurements Height: 5' 3 (1.6 m) Weight: 139 lb (63 kg) BMI (Calculated): 24.63 Weight at Last Visit: 140 lb Weight Lost Since Last Visit: 1 Weight Gained Since Last Visit: 0 Starting Weight: 201 lb Total Weight Loss (lbs): 62 lb (28.1 kg) Peak Weight: 243 lb   Body Composition  Body Fat %: 34.9 % Fat Mass (lbs): 48.8 lbs Muscle Mass (lbs): 86.4 lbs Total Body Water  (lbs): 64.4 lbs Visceral Fat Rating : 5   Other Clinical Data Fasting: no Labs: no Today's Visit #: 32 Starting Date: 01/23/21    Chief Complaint:   OBESITY Erin Flores is here to discuss her progress with her obesity treatment plan.  She is on the the Category 2 Plan and states she is following her eating plan approximately 80 % of the time.  She states she is exercising Cardiovascular Exercise 30 minutes 5 times per week.  Interim History:   03/30/24 07:00  Height 5' 3 (1.6 m)  Weight 139 lb (63 kg)  BMI (Calculated) 24.63  Total Weight Loss (lbs) 62 lb (28.1 kg)    She is agreeable to being Mx Phase today   She is on Zepbound  15mg  injection, administers Q10-12days Denies mass in neck, dysphagia, dyspepsia, persistent hoarseness, abdominal pain, or N/V/C   Patient was counseled on the importance of maintaining healthy lifestyle habits, including balanced nutrition, regular physical activity, and behavioral modifications, while taking antiobesity medication.   Patient verbalized understanding that medication is an adjunct to, not a replacement for, lifestyle changes and that the long-term success and weight maintenance depend on continued adherence to these strategies.  Subjective:   1. Essential hypertension BP elevated at OV She has been taking Avapro  75mg  1/2 tab every other evening. Last dose was Monday night 10/27 She denies CP She will experience with quick  position changes.  2. Insulin  resistance  Latest Reference Range & Units 02/16/23 09:17 07/22/23 08:40 02/03/24 07:54  INSULIN  2.6 - 24.9 uIU/mL 29.9 (H) 14.8 9.3  (H): Data is abnormally high  She is on daily Metformin  500mg  and Zepbound  15mg  injection Q10-14 days Denies mass in neck, dysphagia, dyspepsia, persistent hoarseness, abdominal pain, or N/V/C   3. Prediabetes Lab Results  Component Value Date   HGBA1C 5.3 02/03/2024   HGBA1C 5.4 07/22/2023   HGBA1C 5.5 02/16/2023    She is on daily Metformin  500mg  and Zepbound  15mg  injection Q10-14 days Denies mass in neck, dysphagia, dyspepsia, persistent hoarseness, abdominal pain, or N/V/C   4. NAFLD (nonalcoholic fatty liver disease)  Latest Reference Range & Units 02/06/23 00:00 07/22/23 08:40 01/10/24 12:35  Alkaline Phosphatase 38 - 126 U/L 116 (E) 109 88  Albumin 3.5 - 5.0 g/dL 4.7 (E) 4.5 4.6  Lipase 11 - 51 U/L   29  AST 15 - 41 U/L 44 ! (E) 27 23  ALT 0 - 44 U/L 81 ! (E) 56 (H) 36   She started on weekly Zepbound  therapy May 2024 She is currently on Zebbound 15mg  administering every 10-14 days She would like to continue GLP-1/GIP therapy indefinetly for control of BP, HLD, NAFLD, healthy weight, and remain off ETOH  Assessment/Plan:   1. Essential hypertension Limit Na+ intake Continue healthy eating, regular exercise, and Avapro  75mg  1/2 tab Q48H Monitor home BP Remain well hydrated with water   2. Insulin  resistance (Primary) Refill tirzepatide  (ZEPBOUND ) 15 MG/0.5ML  Pen Inject 15 mg into the skin once a week. Dispense: 6 mL, Refills: 0 ordered  Patient was counseled on the importance of maintaining healthy lifestyle habits, including balanced nutrition, regular physical activity, and behavioral modifications, while taking antiobesity medication.   Patient verbalized understanding that medication is an adjunct to, not a replacement for, lifestyle changes and that the long-term success and weight maintenance depend  on continued adherence to these strategies.  3. Prediabetes Refill  metFORMIN  (GLUCOPHAGE ) 500 MG tablet 1 tab with meal daily 500mg  total dose per day Dispense: 90 tablet, Refills: 0 ordered   4. NAFLD (nonalcoholic fatty liver disease) Refill tirzepatide  (ZEPBOUND ) 15 MG/0.5ML Pen Inject 15 mg into the skin once a week. Dispense: 6 mL, Refills: 0 ordered  Patient was counseled on the importance of maintaining healthy lifestyle habits, including balanced nutrition, regular physical activity, and behavioral modifications, while taking antiobesity medication.   Patient verbalized understanding that medication is an adjunct to, not a replacement for, lifestyle changes and that the long-term success and weight maintenance depend on continued adherence to these strategies.  5. Obesity with current BMI of 24.8 Refill tirzepatide  (ZEPBOUND ) 15 MG/0.5ML Pen Inject 15 mg into the skin once a week. Dispense: 6 mL, Refills: 0 ordered  Patient was counseled on the importance of maintaining healthy lifestyle habits, including balanced nutrition, regular physical activity, and behavioral modifications, while taking antiobesity medication.   Patient verbalized understanding that medication is an adjunct to, not a replacement for, lifestyle changes and that the long-term success and weight maintenance depend on continued adherence to these strategies.  Erin Flores is currently in the action stage of change. As such, her goal is to maintain weight for now. She has agreed to the Category 2 Plan and practicing portion control and making smarter food choices, such as increasing vegetables and decreasing simple carbohydrates.   Exercise goals: For substantial health benefits, adults should do at least 150 minutes (2 hours and 30 minutes) a week of moderate-intensity, or 75 minutes (1 hour and 15 minutes) a week of vigorous-intensity aerobic physical activity, or an equivalent combination of moderate- and  vigorous-intensity aerobic activity. Aerobic activity should be performed in episodes of at least 10 minutes, and preferably, it should be spread throughout the week.  Behavioral modification strategies: increasing lean protein intake, decreasing simple carbohydrates, increasing vegetables, increasing water  intake, meal planning and cooking strategies, keeping healthy foods in the home, ways to avoid boredom eating, ways to avoid night time snacking, and planning for success.  Aziya has agreed to follow-up with our clinic in 4 weeks. She was informed of the importance of frequent follow-up visits to maximize her success with intensive lifestyle modifications for her multiple health conditions.   Start Mx Phase  Objective:   Blood pressure 135/83, pulse 92, temperature 98.1 F (36.7 C), height 5' 3 (1.6 m), weight 139 lb (63 kg), SpO2 99%. Body mass index is 24.62 kg/m.  General: Cooperative, alert, well developed, in no acute distress. HEENT: Conjunctivae and lids unremarkable. Cardiovascular: Regular rhythm.  Lungs: Normal work of breathing. Neurologic: No focal deficits.   Lab Results  Component Value Date   CREATININE 0.78 01/10/2024   BUN 9 01/10/2024   NA 140 01/10/2024   K 4.0 01/10/2024   CL 103 01/10/2024   CO2 23 01/10/2024   Lab Results  Component Value Date   ALT 36 01/10/2024   AST 23 01/10/2024   ALKPHOS 88 01/10/2024   BILITOT 0.4 01/10/2024   Lab Results  Component Value Date   HGBA1C 5.3 02/03/2024   HGBA1C 5.4 07/22/2023   HGBA1C 5.5 02/16/2023   HGBA1C 5.8 (H) 08/21/2022   HGBA1C 5.0 10/18/2021   Lab Results  Component Value Date   INSULIN  9.3 02/03/2024   INSULIN  14.8 07/22/2023   INSULIN  29.9 (H) 02/16/2023   INSULIN  15.2 08/21/2022   INSULIN  6.8 05/14/2021   Lab Results  Component Value Date   TSH 1.87 10/18/2021   Lab Results  Component Value Date   CHOL 232 (H) 02/03/2024   HDL 49 02/03/2024   LDLCALC 164 (H) 02/03/2024   TRIG  104 02/03/2024   CHOLHDL 4.7 (H) 02/03/2024   Lab Results  Component Value Date   VD25OH 53.2 02/03/2024   VD25OH 41.1 07/22/2023   VD25OH 53.4 02/16/2023   Lab Results  Component Value Date   WBC 10.4 01/10/2024   HGB 14.9 01/10/2024   HCT 44.8 01/10/2024   MCV 81.2 01/10/2024   PLT 364 01/10/2024   Lab Results  Component Value Date   IRON 94 05/14/2021   TIBC 539 (H) 05/14/2021   FERRITIN 21 05/14/2021   Attestation Statements:   Reviewed by clinician on day of visit: allergies, medications, problem list, medical history, surgical history, family history, social history, and previous encounter notes.  I have reviewed the above documentation for accuracy and completeness, and I agree with the above. -  Nailah Luepke d. Tiffancy Moger, NP-C

## 2024-04-05 ENCOUNTER — Other Ambulatory Visit (INDEPENDENT_AMBULATORY_CARE_PROVIDER_SITE_OTHER): Payer: Self-pay | Admitting: Adult Health

## 2024-04-05 DIAGNOSIS — K219 Gastro-esophageal reflux disease without esophagitis: Secondary | ICD-10-CM

## 2024-05-11 ENCOUNTER — Ambulatory Visit (INDEPENDENT_AMBULATORY_CARE_PROVIDER_SITE_OTHER): Payer: Self-pay | Admitting: Adult Health

## 2024-05-11 VITALS — BP 128/78 | HR 79 | Temp 98.1°F | Ht 63.0 in | Wt 141.0 lb

## 2024-05-11 DIAGNOSIS — K219 Gastro-esophageal reflux disease without esophagitis: Secondary | ICD-10-CM

## 2024-05-11 DIAGNOSIS — K760 Fatty (change of) liver, not elsewhere classified: Secondary | ICD-10-CM

## 2024-05-11 DIAGNOSIS — R7303 Prediabetes: Secondary | ICD-10-CM

## 2024-05-11 DIAGNOSIS — E669 Obesity, unspecified: Secondary | ICD-10-CM | POA: Diagnosis not present

## 2024-05-11 DIAGNOSIS — Z6825 Body mass index (BMI) 25.0-25.9, adult: Secondary | ICD-10-CM | POA: Diagnosis not present

## 2024-05-11 DIAGNOSIS — Z6835 Body mass index (BMI) 35.0-35.9, adult: Secondary | ICD-10-CM

## 2024-05-11 DIAGNOSIS — E88819 Insulin resistance, unspecified: Secondary | ICD-10-CM

## 2024-05-11 MED ORDER — METFORMIN HCL 500 MG PO TABS: ORAL_TABLET | ORAL | 0 refills | Status: AC

## 2024-05-11 MED ORDER — ZEPBOUND 15 MG/0.5ML ~~LOC~~ SOAJ: 15.0000 mg | SUBCUTANEOUS | 0 refills | Status: DC

## 2024-05-11 MED ORDER — OMEPRAZOLE 20 MG PO CPDR: 20.0000 mg | DELAYED_RELEASE_CAPSULE | Freq: Every day | ORAL | 0 refills | Status: DC

## 2024-05-11 NOTE — Progress Notes (Signed)
 WEIGHT SUMMARY AND BIOMETRICS  Vitals Temp: 98.1 F (36.7 C) BP: 128/78 Pulse Rate: 79 SpO2: 99 %   Anthropometric Measurements Height: 5' 3 (1.6 m) Weight: 141 lb (64 kg) BMI (Calculated): 24.98 Weight at Last Visit: 139 lb Weight Lost Since Last Visit: 0 Weight Gained Since Last Visit: 2 lb Starting Weight: 201 lb Total Weight Loss (lbs): 60 lb (27.2 kg) Peak Weight: 243 lb   Body Composition  Body Fat %: 35 % Fat Mass (lbs): 49.6 lbs Muscle Mass (lbs): 87.6 lbs Total Body Water  (lbs): 65.8 lbs Visceral Fat Rating : 5   Other Clinical Data Fasting: no Labs: no Today's Visit #: 82 Starting Date: 01/23/21    Chief Complaint:   OBESITY Erin Flores is here to discuss her progress with her obesity treatment plan.  She is on the the Category 2 Plan and states she is following her eating plan approximately 50 % of the time.  She states she is exercising Cardiovascular Exercise 35-45 minutes 2 times per week.   Interim History:  She started Mx Phase 03/30/2024  Reviewed Biomepedence Results with pt: Muscle Mass:+1.2 lbs Adipose Mass: +0.8 lbs  She is following Cat 2 MP, exercising regularly, and administers Zepbound  15mg  Q14D Denies mass in neck, dysphagia, dyspepsia, persistent hoarseness, abdominal pain, or N/V/C   Subjective:   1. Gastroesophageal reflux disease, unspecified whether esophagitis present She reports acid reflux sx's well controlled with daily Prilosec 20mg    2. Prediabetes Lab Results  Component Value Date   HGBA1C 5.3 02/03/2024   HGBA1C 5.4 07/22/2023   HGBA1C 5.5 02/16/2023    She is following Cat 2 MP, exercising regularly, and administers Zepbound  15mg  Q14D Denies mass in neck, dysphagia, dyspepsia, persistent hoarseness, abdominal pain, or N/V/C   3. NAFLD (nonalcoholic fatty liver disease)  Latest Reference Range & Units 01/10/24 12:35  Alkaline Phosphatase 38 - 126 U/L 88  Albumin 3.5 - 5.0 g/dL 4.6  Lipase 11 - 51  U/L 29  AST 15 - 41 U/L 23  ALT 0 - 44 U/L 36   She is following Cat 2 MP, exercising regularly, and administers Zepbound  15mg  Q14D Denies mass in neck, dysphagia, dyspepsia, persistent hoarseness, abdominal pain, or N/V/C   10/21/2021 Narrative & Impression  CLINICAL DATA:  Elevated LFTs   EXAM: ULTRASOUND ABDOMEN LIMITED RIGHT UPPER QUADRANT   COMPARISON:  Abdominal ultrasound 08/05/2013   FINDINGS: Gallbladder:   Surgically absent.   Common bile duct:   Diameter: 5 mm   Liver:   Increased echogenicity of the liver parenchyma with no focal mass identified. Portal vein is patent on color Doppler imaging with normal direction of blood flow towards the liver.   Other: None.   IMPRESSION: Increased echogenicity of the liver parenchyma which may represent hepatic steatosis and/or other hepatocellular disease.     4. Insulin  resistance  Latest Reference Range & Units 02/16/23 09:17 07/22/23 08:40 02/03/24 07:54  INSULIN  2.6 - 24.9 uIU/mL 29.9 (H) 14.8 9.3  (H): Data is abnormally high  She is following Cat 2 MP, exercising regularly, and administers Zepbound  15mg  Q14D Denies mass in neck, dysphagia, dyspepsia, persistent hoarseness, abdominal pain, or N/V/C   Assessment/Plan:   1. Gastroesophageal reflux disease, unspecified whether esophagitis present Refill omeprazole  (PRILOSEC) 20 MG capsule Take 1 capsule (20 mg total) by mouth daily. Dispense: 90 capsule, Refills: 0 ordered   2. Prediabetes (Primary) Refill metFORMIN  (GLUCOPHAGE ) 500 MG tablet 1 tab with meal daily 500mg  total dose per  day Dispense: 90 tablet, Refills: 0 ordered   3. NAFLD (nonalcoholic fatty liver disease) Continue healthy eating, regular exercise, and monitor labs  4. Insulin  resistance Refill metFORMIN  (GLUCOPHAGE ) 500 MG tablet 1 tab with meal daily 500mg  total dose per day Dispense: 90 tablet, Refills: 0 ordered   5. Obesity with current BMI of 25.1 Refill tirzepatide  (ZEPBOUND )  15 MG/0.5ML Pen Inject 15 mg into the skin once a week. Dispense: 6 mL, Refills: 0 ordered   Erin Flores is currently in the action stage of change. As such, her goal is to maintain weight for now. She has agreed to the Category 2 Plan.   Exercise goals: For substantial health benefits, adults should do at least 150 minutes (2 hours and 30 minutes) a week of moderate-intensity, or 75 minutes (1 hour and 15 minutes) a week of vigorous-intensity aerobic physical activity, or an equivalent combination of moderate- and vigorous-intensity aerobic activity. Aerobic activity should be performed in episodes of at least 10 minutes, and preferably, it should be spread throughout the week.  Behavioral modification strategies: increasing lean protein intake, decreasing simple carbohydrates, increasing vegetables, increasing water  intake, no skipping meals, meal planning and cooking strategies, keeping healthy foods in the home, ways to avoid boredom eating, travel eating strategies, holiday eating strategies , and planning for success.  Erin Flores has agreed to follow-up with our clinic in 4 weeks. She was informed of the importance of frequent follow-up visits to maximize her success with intensive lifestyle modifications for her multiple health conditions.   Check Fasting Labs at next OV  Objective:   Blood pressure 128/78, pulse 79, temperature 98.1 F (36.7 C), height 5' 3 (1.6 m), weight 141 lb (64 kg), SpO2 99%. Body mass index is 24.98 kg/m.  General: Cooperative, alert, well developed, in no acute distress. HEENT: Conjunctivae and lids unremarkable. Cardiovascular: Regular rhythm.  Lungs: Normal work of breathing. Neurologic: No focal deficits.   Lab Results  Component Value Date   CREATININE 0.78 01/10/2024   BUN 9 01/10/2024   NA 140 01/10/2024   K 4.0 01/10/2024   CL 103 01/10/2024   CO2 23 01/10/2024   Lab Results  Component Value Date   ALT 36 01/10/2024   AST 23 01/10/2024   ALKPHOS  88 01/10/2024   BILITOT 0.4 01/10/2024   Lab Results  Component Value Date   HGBA1C 5.3 02/03/2024   HGBA1C 5.4 07/22/2023   HGBA1C 5.5 02/16/2023   HGBA1C 5.8 (H) 08/21/2022   HGBA1C 5.0 10/18/2021   Lab Results  Component Value Date   INSULIN  9.3 02/03/2024   INSULIN  14.8 07/22/2023   INSULIN  29.9 (H) 02/16/2023   INSULIN  15.2 08/21/2022   INSULIN  6.8 05/14/2021   Lab Results  Component Value Date   TSH 1.87 10/18/2021   Lab Results  Component Value Date   CHOL 232 (H) 02/03/2024   HDL 49 02/03/2024   LDLCALC 164 (H) 02/03/2024   TRIG 104 02/03/2024   CHOLHDL 4.7 (H) 02/03/2024   Lab Results  Component Value Date   VD25OH 53.2 02/03/2024   VD25OH 41.1 07/22/2023   VD25OH 53.4 02/16/2023   Lab Results  Component Value Date   WBC 10.4 01/10/2024   HGB 14.9 01/10/2024   HCT 44.8 01/10/2024   MCV 81.2 01/10/2024   PLT 364 01/10/2024   Lab Results  Component Value Date   IRON 94 05/14/2021   TIBC 539 (H) 05/14/2021   FERRITIN 21 05/14/2021   Attestation Statements:   Reviewed by  clinician on day of visit: allergies, medications, problem list, medical history, surgical history, family history, social history, and previous encounter notes.  I have reviewed the above documentation for accuracy and completeness, and I agree with the above. -  Erin Flores d. Marton Malizia, NP-C

## 2024-05-18 DIAGNOSIS — F319 Bipolar disorder, unspecified: Secondary | ICD-10-CM | POA: Diagnosis not present

## 2024-06-01 ENCOUNTER — Other Ambulatory Visit (HOSPITAL_COMMUNITY): Payer: Self-pay

## 2024-06-01 ENCOUNTER — Telehealth (INDEPENDENT_AMBULATORY_CARE_PROVIDER_SITE_OTHER): Payer: Self-pay

## 2024-06-01 NOTE — Telephone Encounter (Signed)
 Lavada Langsam (Key: [id number]) Zepbound  15MG /0.5ML pen-injectors

## 2024-06-06 NOTE — Telephone Encounter (Signed)
 RjdzPi:[id number]; Status:Approved; Review Type:Prior Auth; Coverage Start Date:05/31/2023; Coverage End Date:06/29/2024;. Authorization Expiration Date: June 29, 2024.

## 2024-07-05 ENCOUNTER — Telehealth (INDEPENDENT_AMBULATORY_CARE_PROVIDER_SITE_OTHER): Payer: Self-pay

## 2024-07-05 NOTE — Telephone Encounter (Signed)
 Erin Flores (Key: [id number]) Rx #: [id number] Zepbound  15MG /0.5ML pen-injectors

## 2024-07-06 ENCOUNTER — Other Ambulatory Visit (INDEPENDENT_AMBULATORY_CARE_PROVIDER_SITE_OTHER): Payer: Self-pay | Admitting: Adult Health

## 2024-07-06 ENCOUNTER — Ambulatory Visit (INDEPENDENT_AMBULATORY_CARE_PROVIDER_SITE_OTHER): Admitting: Adult Health

## 2024-07-06 VITALS — BP 123/79 | HR 91 | Temp 98.7°F | Ht 63.0 in | Wt 144.0 lb

## 2024-07-06 DIAGNOSIS — E559 Vitamin D deficiency, unspecified: Secondary | ICD-10-CM

## 2024-07-06 DIAGNOSIS — Z6835 Body mass index (BMI) 35.0-35.9, adult: Secondary | ICD-10-CM

## 2024-07-06 DIAGNOSIS — Z6825 Body mass index (BMI) 25.0-25.9, adult: Secondary | ICD-10-CM

## 2024-07-06 DIAGNOSIS — E669 Obesity, unspecified: Secondary | ICD-10-CM

## 2024-07-06 DIAGNOSIS — I10 Essential (primary) hypertension: Secondary | ICD-10-CM

## 2024-07-06 DIAGNOSIS — K760 Fatty (change of) liver, not elsewhere classified: Secondary | ICD-10-CM

## 2024-07-06 DIAGNOSIS — R7303 Prediabetes: Secondary | ICD-10-CM

## 2024-07-06 DIAGNOSIS — K219 Gastro-esophageal reflux disease without esophagitis: Secondary | ICD-10-CM

## 2024-07-06 MED ORDER — OMEPRAZOLE 20 MG PO CPDR: 20.0000 mg | DELAYED_RELEASE_CAPSULE | Freq: Every day | ORAL | 1 refills | Status: AC

## 2024-07-06 MED ORDER — IRBESARTAN 75 MG PO TABS: ORAL_TABLET | ORAL | 1 refills | Status: AC

## 2024-07-06 MED ORDER — ZEPBOUND 15 MG/0.5ML ~~LOC~~ SOAJ: 15.0000 mg | SUBCUTANEOUS | 1 refills | Status: AC

## 2024-07-06 NOTE — Telephone Encounter (Signed)
 CaseId:[id number];Status:Approved;Review Type:Prior Auth;Coverage Start Date:06/05/2024;Coverage End Date:07/05/2025;. Authorization Expiration Date: July 05, 2025.

## 2024-07-06 NOTE — Progress Notes (Signed)
 "    WEIGHT SUMMARY AND BIOMETRICS  Vitals Temp: 98.7 F (37.1 C) BP: 123/79 Pulse Rate: 91 SpO2: 100 %   Anthropometric Measurements Height: 5' 3 (1.6 m) Weight: 144 lb (65.3 kg) BMI (Calculated): 25.51 Weight at Last Visit: 141 lb Weight Lost Since Last Visit: 0 Weight Gained Since Last Visit: 3 lb Starting Weight: 201 lb Total Weight Loss (lbs): 57 lb (25.9 kg) Peak Weight: 243 lb   Body Composition  Body Fat %: 36.7 % Fat Mass (lbs): 52.8 lbs Muscle Mass (lbs): 86.6 lbs Total Body Water  (lbs): 67 lbs Visceral Fat Rating : 6   Other Clinical Data Fasting: yes Labs: no Today's Visit #: 34 Starting Date: 01/23/21    Chief Complaint:   OBESITY Erin Flores is here to discuss her progress with her obesity treatment plan.  Erin Flores is on the the Category 2 Plan and states Erin Flores is following her eating plan approximately 75 % of the time.  Erin Flores states Erin Flores is exercising: Exercise paused the last 2 weeks due to life events.  Interim History:  Erin Flores started Mx Phase 03/30/2024  Erin Flores is following Cat 2 MP, exercising regularly, and administers Zepbound  15mg  Q14D Denies mass in neck, dysphagia, dyspepsia, persistent hoarseness, abdominal pain, or N/V/C   Zepbound  PA: CaseId:[id number];Status:Approved;Review Type:Prior Auth;Coverage Start Date:06/05/2024;Coverage End Date:07/05/2025;. Authorization Expiration Date: July 05, 2025.      Subjective:   1. Gastroesophageal reflux disease, unspecified whether esophagitis present Erin Flores reports GERD sx's are well controlled with daily Prilosec 20mg   2. Prediabetes Lab Results  Component Value Date   HGBA1C 5.3 02/03/2024   HGBA1C 5.4 07/22/2023   HGBA1C 5.5 02/16/2023    Erin Flores is following Cat 2 MP, exercising regularly, and administers Zepbound  15mg  Q14D Denies mass in neck, dysphagia, dyspepsia, persistent hoarseness, abdominal pain, or N/V/C   3. NAFLD (nonalcoholic fatty liver disease) Erin Flores denies RUQ  pain  10/21/2021 CLINICAL DATA:  Elevated LFTs   EXAM: ULTRASOUND ABDOMEN LIMITED RIGHT UPPER QUADRANT   COMPARISON:  Abdominal ultrasound 08/05/2013   FINDINGS: Gallbladder:   Surgically absent.   Common bile duct:   Diameter: 5 mm   Liver:   Increased echogenicity of the liver parenchyma with no focal mass identified. Portal vein is patent on color Doppler imaging with normal direction of blood flow towards the liver.   Other: None.   IMPRESSION: Increased echogenicity of the liver parenchyma which may represent hepatic steatosis and/or other hepatocellular disease.  4. Vitamin D  deficiency  Latest Reference Range & Units 02/03/24 07:54  Vitamin D , 25-Hydroxy 30.0 - 100.0 ng/mL 53.2   Vit D level stable and at goal  5. Essential hypertension BP stable at OV   Assessment/Plan:   1. Gastroesophageal reflux disease, unspecified whether esophagitis present (Primary) Refill - omeprazole  (PRILOSEC) 20 MG capsule; Take 1 capsule (20 mg total) by mouth daily.  Dispense: 90 capsule; Refill: 1  2. Prediabetes Check Labs - Hemoglobin A1c - Insulin , random - CBC with Differential/Platelet  3. NAFLD (nonalcoholic fatty liver disease) Refill - tirzepatide  (ZEPBOUND ) 15 MG/0.5ML Pen; Inject 15 mg into the skin once a week.  Dispense: 6 mL; Refill: 1 Check Labs - Comprehensive metabolic panel with GFR  4. Vitamin D  deficiency Check Labs - VITAMIN D  25 Hydroxy (Vit-D Deficiency, Fractures)  5. Essential hypertension Refill - irbesartan  (AVAPRO ) 75 MG tablet; TAKE 1/2 TABLET BY MOUTH EVERY OTHER DAY  Dispense: 60 tablet; Refill: 1 Monitor BP- bring log to f/u OV  6. Obesity with current  BMI of 25. Refill - tirzepatide  (ZEPBOUND ) 15 MG/0.5ML Pen; Inject 15 mg into the skin once a week.  Dispense: 6 mL; Refill: 1 Continue taking every other week  Erin Flores is currently in the action stage of change. As such, her goal is to maintain weight for now. Erin Flores has agreed to  the Category 2 Plan.   Exercise goals: For substantial health benefits, adults should do at least 150 minutes (2 hours and 30 minutes) a week of moderate-intensity, or 75 minutes (1 hour and 15 minutes) a week of vigorous-intensity aerobic physical activity, or an equivalent combination of moderate- and vigorous-intensity aerobic activity. Aerobic activity should be performed in episodes of at least 10 minutes, and preferably, it should be spread throughout the week.  Behavioral modification strategies: increasing lean protein intake, decreasing simple carbohydrates, increasing vegetables, increasing water  intake, no skipping meals, meal planning and cooking strategies, keeping healthy foods in the home, ways to avoid boredom eating, and planning for success.  Erin Flores has agreed to follow-up with our clinic in 8 weeks. Erin Flores was informed of the importance of frequent follow-up visits to maximize her success with intensive lifestyle modifications for her multiple health conditions.   Erin Flores was informed we would discuss her lab results at her next visit unless there is a critical issue that needs to be addressed sooner. Erin Flores agreed to keep her next visit at the agreed upon time to discuss these results.  Objective:   Blood pressure 123/79, pulse 91, temperature 98.7 F (37.1 C), height 5' 3 (1.6 m), weight 144 lb (65.3 kg), SpO2 100%. Body mass index is 25.51 kg/m.  General: Cooperative, alert, well developed, in no acute distress. HEENT: Conjunctivae and lids unremarkable. Cardiovascular: Regular rhythm.  Lungs: Normal work of breathing. Neurologic: No focal deficits.   Lab Results  Component Value Date   CREATININE 0.78 01/10/2024   BUN 9 01/10/2024   NA 140 01/10/2024   K 4.0 01/10/2024   CL 103 01/10/2024   CO2 23 01/10/2024   Lab Results  Component Value Date   ALT 36 01/10/2024   AST 23 01/10/2024   ALKPHOS 88 01/10/2024   BILITOT 0.4 01/10/2024   Lab Results   Component Value Date   HGBA1C 5.3 02/03/2024   HGBA1C 5.4 07/22/2023   HGBA1C 5.5 02/16/2023   HGBA1C 5.8 (H) 08/21/2022   HGBA1C 5.0 10/18/2021   Lab Results  Component Value Date   INSULIN  9.3 02/03/2024   INSULIN  14.8 07/22/2023   INSULIN  29.9 (H) 02/16/2023   INSULIN  15.2 08/21/2022   INSULIN  6.8 05/14/2021   Lab Results  Component Value Date   TSH 1.87 10/18/2021   Lab Results  Component Value Date   CHOL 232 (H) 02/03/2024   HDL 49 02/03/2024   LDLCALC 164 (H) 02/03/2024   TRIG 104 02/03/2024   CHOLHDL 4.7 (H) 02/03/2024   Lab Results  Component Value Date   VD25OH 53.2 02/03/2024   VD25OH 41.1 07/22/2023   VD25OH 53.4 02/16/2023   Lab Results  Component Value Date   WBC 10.4 01/10/2024   HGB 14.9 01/10/2024   HCT 44.8 01/10/2024   MCV 81.2 01/10/2024   PLT 364 01/10/2024   Lab Results  Component Value Date   IRON 94 05/14/2021   TIBC 539 (H) 05/14/2021   FERRITIN 21 05/14/2021   Attestation Statements:   Reviewed by clinician on day of visit: allergies, medications, problem list, medical history, surgical history, family history, social history, and previous encounter notes.  I have reviewed the above documentation for accuracy and completeness, and I agree with the above. -  Aricela Bertagnolli d. Stachia Slutsky, NP-C "

## 2024-07-07 ENCOUNTER — Encounter (INDEPENDENT_AMBULATORY_CARE_PROVIDER_SITE_OTHER): Payer: Self-pay | Admitting: Adult Health

## 2024-07-07 LAB — CBC WITH DIFFERENTIAL/PLATELET
Basophils Absolute: 0 10*3/uL (ref 0.0–0.2)
Basos: 0 %
EOS (ABSOLUTE): 0 10*3/uL (ref 0.0–0.4)
Eos: 0 %
Hematocrit: 44.2 % (ref 34.0–46.6)
Hemoglobin: 14 g/dL (ref 11.1–15.9)
Immature Grans (Abs): 0 10*3/uL (ref 0.0–0.1)
Immature Granulocytes: 0 %
Lymphocytes Absolute: 2.4 10*3/uL (ref 0.7–3.1)
Lymphs: 34 %
MCH: 26.7 pg (ref 26.6–33.0)
MCHC: 31.7 g/dL (ref 31.5–35.7)
MCV: 84 fL (ref 79–97)
Monocytes Absolute: 0.4 10*3/uL (ref 0.1–0.9)
Monocytes: 6 %
Neutrophils Absolute: 4.3 10*3/uL (ref 1.4–7.0)
Neutrophils: 60 %
Platelets: 338 10*3/uL (ref 150–450)
RBC: 5.25 x10E6/uL (ref 3.77–5.28)
RDW: 13.2 % (ref 11.7–15.4)
WBC: 7.2 10*3/uL (ref 3.4–10.8)

## 2024-07-07 LAB — COMPREHENSIVE METABOLIC PANEL WITH GFR
ALT: 19 [IU]/L (ref 0–32)
AST: 15 [IU]/L (ref 0–40)
Albumin: 4.6 g/dL (ref 3.9–4.9)
Alkaline Phosphatase: 72 [IU]/L (ref 41–116)
BUN/Creatinine Ratio: 12 (ref 9–23)
BUN: 9 mg/dL (ref 6–24)
Bilirubin Total: 0.7 mg/dL (ref 0.0–1.2)
CO2: 23 mmol/L (ref 20–29)
Calcium: 9.4 mg/dL (ref 8.7–10.2)
Chloride: 104 mmol/L (ref 96–106)
Creatinine, Ser: 0.74 mg/dL (ref 0.57–1.00)
Globulin, Total: 2.4 g/dL (ref 1.5–4.5)
Glucose: 75 mg/dL (ref 70–99)
Potassium: 4.2 mmol/L (ref 3.5–5.2)
Sodium: 141 mmol/L (ref 134–144)
Total Protein: 7 g/dL (ref 6.0–8.5)
eGFR: 105 mL/min/{1.73_m2}

## 2024-07-07 LAB — HEMOGLOBIN A1C
Est. average glucose Bld gHb Est-mCnc: 105 mg/dL
Hgb A1c MFr Bld: 5.3 % (ref 4.8–5.6)

## 2024-07-07 LAB — INSULIN, RANDOM: INSULIN: 9.9 u[IU]/mL (ref 2.6–24.9)

## 2024-07-07 LAB — VITAMIN D 25 HYDROXY (VIT D DEFICIENCY, FRACTURES): Vit D, 25-Hydroxy: 42.9 ng/mL (ref 30.0–100.0)
